# Patient Record
Sex: Female | Born: 1970 | Race: Black or African American | Hispanic: No | Marital: Married | Smoking: Never smoker
Health system: Southern US, Community
[De-identification: ages and names within clinical notes are randomized; demographics above are authoritative.]

## PROBLEM LIST (undated history)

## (undated) DIAGNOSIS — I10 Essential (primary) hypertension: Secondary | ICD-10-CM

## (undated) DIAGNOSIS — I639 Cerebral infarction, unspecified: Secondary | ICD-10-CM

## (undated) DIAGNOSIS — N189 Chronic kidney disease, unspecified: Secondary | ICD-10-CM

## (undated) DIAGNOSIS — E119 Type 2 diabetes mellitus without complications: Secondary | ICD-10-CM

## (undated) HISTORY — DX: Chronic kidney disease, unspecified: N18.9

## (undated) HISTORY — DX: Essential (primary) hypertension: I10

## (undated) HISTORY — DX: Type 2 diabetes mellitus without complications: E11.9

---

## 2000-07-17 ENCOUNTER — Other Ambulatory Visit: Admission: RE | Admit: 2000-07-17 | Discharge: 2000-07-17 | Payer: Self-pay | Admitting: Family Medicine

## 2013-10-16 ENCOUNTER — Other Ambulatory Visit: Payer: Self-pay

## 2013-10-16 DIAGNOSIS — Z1231 Encounter for screening mammogram for malignant neoplasm of breast: Secondary | ICD-10-CM

## 2013-11-11 ENCOUNTER — Ambulatory Visit: Payer: Self-pay

## 2013-11-25 ENCOUNTER — Ambulatory Visit
Admission: RE | Admit: 2013-11-25 | Discharge: 2013-11-25 | Disposition: A | Discharge status: Home / Self Care | Payer: Self-pay | Source: Ambulatory Visit

## 2013-11-25 DIAGNOSIS — Z1231 Encounter for screening mammogram for malignant neoplasm of breast: Secondary | ICD-10-CM

## 2015-06-09 ENCOUNTER — Encounter (INDEPENDENT_AMBULATORY_CARE_PROVIDER_SITE_OTHER): Payer: Self-pay

## 2015-06-09 ENCOUNTER — Other Ambulatory Visit (INDEPENDENT_AMBULATORY_CARE_PROVIDER_SITE_OTHER): Payer: Managed Care, Other (non HMO)

## 2015-06-09 ENCOUNTER — Ambulatory Visit (INDEPENDENT_AMBULATORY_CARE_PROVIDER_SITE_OTHER): Payer: Managed Care, Other (non HMO) | Admitting: Internal Medicine

## 2015-06-09 ENCOUNTER — Encounter: Payer: Self-pay | Admitting: Internal Medicine

## 2015-06-09 ENCOUNTER — Ambulatory Visit: Payer: Self-pay | Admitting: Internal Medicine

## 2015-06-09 VITALS — BP 178/102 | HR 97 | Temp 98.6°F | Resp 16 | Ht 66.0 in | Wt 160.4 lb

## 2015-06-09 DIAGNOSIS — I10 Essential (primary) hypertension: Secondary | ICD-10-CM

## 2015-06-09 LAB — COMPREHENSIVE METABOLIC PANEL
ALK PHOS: 72 U/L (ref 39–117)
ALT: 15 U/L (ref 0–35)
AST: 13 U/L (ref 0–37)
Albumin: 4.4 g/dL (ref 3.5–5.2)
BUN: 10 mg/dL (ref 6–23)
CO2: 28 meq/L (ref 19–32)
Calcium: 9.9 mg/dL (ref 8.4–10.5)
Chloride: 99 mEq/L (ref 96–112)
Creatinine, Ser: 0.63 mg/dL (ref 0.40–1.20)
GFR: 132.17 mL/min (ref >60.00)
Glucose, Bld: 172 mg/dL — ABNORMAL HIGH (ref 70–99)
POTASSIUM: 4.6 meq/L (ref 3.5–5.1)
Sodium: 135 mEq/L (ref 135–145)
Total Bilirubin: 0.5 mg/dL (ref 0.2–1.2)
Total Protein: 7.8 g/dL (ref 6.0–8.3)

## 2015-06-09 LAB — LIPID PANEL
CHOL/HDL RATIO: 3
Cholesterol: 172 mg/dL (ref 0–200)
HDL: 53.4 mg/dL (ref >39.00)
LDL Cholesterol: 99 mg/dL (ref 0–99)
NONHDL: 118.17
Triglycerides: 98 mg/dL (ref 0.0–149.0)
VLDL: 19.6 mg/dL (ref 0.0–40.0)

## 2015-06-09 LAB — CBC
HCT: 42.1 % (ref 36.0–46.0)
HEMOGLOBIN: 14.2 g/dL (ref 12.0–15.0)
MCHC: 33.6 g/dL (ref 30.0–36.0)
MCV: 83.5 fl (ref 78.0–100.0)
Platelets: 380 10*3/uL (ref 150.0–400.0)
RBC: 5.04 Mil/uL (ref 3.87–5.11)
RDW: 13.5 % (ref 11.5–15.5)
WBC: 7.3 10*3/uL (ref 4.0–10.5)

## 2015-06-09 LAB — HEMOGLOBIN A1C: HEMOGLOBIN A1C: 10.7 % — AB (ref 4.6–6.5)

## 2015-06-09 MED ORDER — AMLODIPINE BESYLATE 10 MG PO TABS: 10.0000 mg | ORAL_TABLET | Freq: Every day | ORAL | Status: DC

## 2015-06-09 NOTE — Assessment & Plan Note (Signed)
Given her moderately elevated BP with complications (she was not clear but likely some either retinopathy or hemorrhage at the back of the eye). Rx for amlodipine 10 mg daily and recheck in 2-3 weeks. Checking labs for any signs of complications.

## 2015-06-09 NOTE — Progress Notes (Signed)
Pre visit review using our clinic review tool, if applicable. No additional management support is needed unless otherwise documented below in the visit note. 

## 2015-06-09 NOTE — Progress Notes (Signed)
   Subjective:    Patient ID: Lori Fleming, female    DOB: Nov 01, 1970, 44 y.o.   MRN: [id number]  HPI The patient is a 44 YO female coming in for high blood pressure. She has been told this several times in the past. She has not seen a doctor in a long time and does not take medicine. Her eye doctor told her that she has changes in her eye of either blood pressure or sugars. She is having some mild headaches, no chest pains. No abdominal pain or nausea or confusion. Not exercising at all. No fevers or chills. No SOB.   PMH, [facility], social history reviewed and updated at visit.   Review of Systems  Constitutional: Negative for fever, activity change, appetite change, fatigue and unexpected weight change.  Eyes: Negative for redness and visual disturbance.       Bleeding at the back of the eye per eye doctor  Respiratory: Negative for cough, chest tightness, shortness of breath and wheezing.   Cardiovascular: Negative for chest pain, palpitations and leg swelling.  Gastrointestinal: Negative for nausea, abdominal pain, diarrhea, constipation and abdominal distention.  Musculoskeletal: Negative for myalgias, back pain, arthralgias and neck pain.  Skin: Negative.   Neurological: Positive for headaches. Negative for dizziness, syncope, weakness, light-headedness and numbness.  Psychiatric/Behavioral: Negative.       Objective:   Physical Exam  Constitutional: She is oriented to person, place, and time. She appears well-developed and well-nourished.  HENT:  Head: Normocephalic and atraumatic.  Eyes: EOM are normal.  Neck: Normal range of motion.  Cardiovascular: Normal rate and regular rhythm.   No murmur heard. Pulmonary/Chest: Effort normal and breath sounds normal. No respiratory distress. She has no wheezes. She has no rales.  Abdominal: Soft. Bowel sounds are normal. She exhibits no distension. There is no tenderness. There is no rebound.  Musculoskeletal: She exhibits no edema.    Neurological: She is alert and oriented to person, place, and time. Coordination normal.  Skin: Skin is warm and dry.  Psychiatric: She has a normal mood and affect.   Filed Vitals:   06/09/15 0919 06/09/15 0941  BP: 170/102 178/102  Pulse: 97   Temp: 98.6 F (37 C)   TempSrc: Oral   Resp: 16   Height: 5\' 6"  (1.676 m)   Weight: 160 lb 6.4 oz (72.757 kg)   SpO2: 99%       Assessment & Plan:

## 2015-06-09 NOTE — Patient Instructions (Signed)
We will start you on a medicine for the blood pressure called amlodipine. Take 1 pill daily for the pressure.  We would like to see you back in about 2-3 weeks to see how you are doing with the medicine and check on the pressure.  Work on exercising about 3-4 times per week for your health.

## 2015-06-13 ENCOUNTER — Other Ambulatory Visit: Payer: Self-pay | Admitting: Internal Medicine

## 2015-06-13 MED ORDER — METFORMIN HCL 500 MG PO TABS: 500.0000 mg | ORAL_TABLET | Freq: Two times a day (BID) | ORAL | Status: DC

## 2015-06-29 ENCOUNTER — Ambulatory Visit (INDEPENDENT_AMBULATORY_CARE_PROVIDER_SITE_OTHER): Payer: Managed Care, Other (non HMO)

## 2015-06-29 VITALS — BP 130/84

## 2015-06-29 DIAGNOSIS — I10 Essential (primary) hypertension: Secondary | ICD-10-CM | POA: Diagnosis not present

## 2015-06-29 NOTE — Progress Notes (Signed)
Have reviewed the encounter and was available for consult as needed.

## 2016-01-05 ENCOUNTER — Ambulatory Visit (INDEPENDENT_AMBULATORY_CARE_PROVIDER_SITE_OTHER): Payer: Managed Care, Other (non HMO) | Admitting: Internal Medicine

## 2016-01-05 ENCOUNTER — Encounter: Payer: Self-pay | Admitting: Internal Medicine

## 2016-01-05 ENCOUNTER — Other Ambulatory Visit (INDEPENDENT_AMBULATORY_CARE_PROVIDER_SITE_OTHER): Payer: Managed Care, Other (non HMO)

## 2016-01-05 VITALS — BP 122/80 | HR 91 | Temp 98.3°F | Resp 16 | Ht 66.0 in | Wt 163.0 lb

## 2016-01-05 DIAGNOSIS — I10 Essential (primary) hypertension: Secondary | ICD-10-CM

## 2016-01-05 DIAGNOSIS — E118 Type 2 diabetes mellitus with unspecified complications: Secondary | ICD-10-CM

## 2016-01-05 LAB — COMPREHENSIVE METABOLIC PANEL
ALBUMIN: 4 g/dL (ref 3.5–5.2)
ALT: 13 U/L (ref 0–35)
AST: 10 U/L (ref 0–37)
Alkaline Phosphatase: 75 U/L (ref 39–117)
BUN: 9 mg/dL (ref 6–23)
CALCIUM: 9.5 mg/dL (ref 8.4–10.5)
CHLORIDE: 97 meq/L (ref 96–112)
CO2: 29 meq/L (ref 19–32)
Creatinine, Ser: 0.7 mg/dL (ref 0.40–1.20)
GFR: 116.73 mL/min (ref >60.00)
Glucose, Bld: 331 mg/dL — ABNORMAL HIGH (ref 70–99)
Potassium: 4.2 mEq/L (ref 3.5–5.1)
Sodium: 132 mEq/L — ABNORMAL LOW (ref 135–145)
Total Bilirubin: 0.5 mg/dL (ref 0.2–1.2)
Total Protein: 7.1 g/dL (ref 6.0–8.3)

## 2016-01-05 LAB — MICROALBUMIN / CREATININE URINE RATIO
CREATININE, U: 221.8 mg/dL
MICROALB/CREAT RATIO: 5.2 mg/g (ref 0.0–30.0)
Microalb, Ur: 11.5 mg/dL — ABNORMAL HIGH (ref 0.0–1.9)

## 2016-01-05 LAB — HEMOGLOBIN A1C: Hgb A1c MFr Bld: 11.7 % — ABNORMAL HIGH (ref 4.6–6.5)

## 2016-01-05 MED ORDER — METFORMIN HCL ER 750 MG PO TB24: 750.0000 mg | ORAL_TABLET | Freq: Every day | ORAL | Status: DC

## 2016-01-05 NOTE — Assessment & Plan Note (Signed)
BP is at goal on amlodipine. Checking CMP and adjust as needed. No side effects from the medicine.

## 2016-01-05 NOTE — Assessment & Plan Note (Addendum)
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 Checking Hga1c, CMP, microalbumin. Adjust therapy as needed. Will change metformin to long acting for less side effects. New diagnosis for her and referred to diabetic education as she is interested in learning more about what she can do for her diabetes.

## 2016-01-05 NOTE — Progress Notes (Signed)
   Subjective:    Patient ID: Lori Fleming, female    DOB: 1971-09-21, 45 y.o.   MRN: [id number]  HPI The patient is a 45 YO female coming in for follow up of her blood pressure (started on amlodipine without problems, no side effects, no headaches or chest pains, complicated by retinopathy) as well as her new problem of diabetes (started taking metformin and makes her run to the bathroom during the day which is not ideal, she is having some numbness in her feet and the retinopathy moderate in her eyes, not on ACE-I or ARB, has family members with diabetes so has limited amount of knowledge about it). We discuss the etiology and prognosis with diabetes. She is interested in learning more and willing to do classes.   Review of Systems  Constitutional: Negative for fever, activity change, appetite change, fatigue and unexpected weight change.  HENT: Negative.   Eyes: Negative for redness and visual disturbance.       Bleeding at the back of the eye per eye doctor  Respiratory: Negative for cough, chest tightness, shortness of breath and wheezing.   Cardiovascular: Negative for chest pain, palpitations and leg swelling.  Gastrointestinal: Negative for nausea, abdominal pain, diarrhea, constipation and abdominal distention.  Musculoskeletal: Negative for myalgias, back pain, arthralgias and neck pain.  Skin: Negative.   Neurological: Negative for dizziness, syncope, weakness, light-headedness, numbness and headaches.  Psychiatric/Behavioral: Negative.       Objective:   Physical Exam  Constitutional: She is oriented to person, place, and time. She appears well-developed and well-nourished.  HENT:  Head: Normocephalic and atraumatic.  Eyes: EOM are normal.  Glasses  Neck: Normal range of motion.  Cardiovascular: Normal rate and regular rhythm.   No murmur heard. Pulmonary/Chest: Effort normal and breath sounds normal. No respiratory distress. She has no wheezes. She has no rales.    Abdominal: Soft. Bowel sounds are normal. She exhibits no distension. There is no tenderness. There is no rebound.  Musculoskeletal: She exhibits no edema.  Neurological: She is alert and oriented to person, place, and time. Coordination normal.  Skin: Skin is warm and dry.  See foot exam   Filed Vitals:   01/05/16 0931  BP: 122/80  Pulse: 91  Temp: 98.3 F (36.8 C)  TempSrc: Oral  Resp: 16  Height: 5\' 6"  (1.676 m)  Weight: 163 lb (73.936 kg)  SpO2: 97%      Assessment & Plan:  Visit time 25 minutes, greater than 50% of which was spent in counseling and coordination of care with the patient: counseling on new diagnosis of diabetes, etiology, treatment, complications, dietary changes, exercise.

## 2016-01-05 NOTE — Progress Notes (Signed)
Pre visit review using our clinic review tool, if applicable. No additional management support is needed unless otherwise documented below in the visit note. 

## 2016-01-05 NOTE — Patient Instructions (Signed)
We will check the labs today and call you back about the results.   We have also put in the order for a diabetes educator to talk to you about diabetes.   Diabetes and Foot Care Diabetes may cause you to have problems because of poor blood supply (circulation) to your feet and legs. This may cause the skin on your feet to become thinner, break easier, and heal more slowly. Your skin may become dry, and the skin may peel and crack. You may also have nerve damage in your legs and feet causing decreased feeling in them. You may not notice minor injuries to your feet that could lead to infections or more serious problems. Taking care of your feet is one of the most important things you can do for yourself.  HOME CARE INSTRUCTIONS  Wear shoes at all times, even in the house. Do not go barefoot. Bare feet are easily injured.  Check your feet daily for blisters, cuts, and redness. If you cannot see the bottom of your feet, use a mirror or ask someone for help.  Wash your feet with warm water (do not use hot water) and mild soap. Then pat your feet and the areas between your toes until they are completely dry. Do not soak your feet as this can dry your skin.  Apply a moisturizing lotion or petroleum jelly (that does not contain alcohol and is unscented) to the skin on your feet and to dry, brittle toenails. Do not apply lotion between your toes.  Trim your toenails straight across. Do not dig under them or around the cuticle. File the edges of your nails with an emery board or nail file.  Do not cut corns or calluses or try to remove them with medicine.  Wear clean socks or stockings every day. Make sure they are not too tight. Do not wear knee-high stockings since they may decrease blood flow to your legs.  Wear shoes that fit properly and have enough cushioning. To break in new shoes, wear them for just a few hours a day. This prevents you from injuring your feet. Always look in your shoes before you  put them on to be sure there are no objects inside.  Do not cross your legs. This may decrease the blood flow to your feet.  If you find a minor scrape, cut, or break in the skin on your feet, keep it and the skin around it clean and dry. These areas may be cleansed with mild soap and water. Do not cleanse the area with peroxide, alcohol, or iodine.  When you remove an adhesive bandage, be sure not to damage the skin around it.  If you have a wound, look at it several times a day to make sure it is healing.  Do not use heating pads or hot water bottles. They may burn your skin. If you have lost feeling in your feet or legs, you may not know it is happening until it is too late.  Make sure your health care provider performs a complete foot exam at least annually or more often if you have foot problems. Report any cuts, sores, or bruises to your health care provider immediately. SEEK MEDICAL CARE IF:   You have an injury that is not healing.  You have cuts or breaks in the skin.  You have an ingrown nail.  You notice redness on your legs or feet.  You feel burning or tingling in your legs or feet.  You have pain or cramps in your legs and feet.  Your legs or feet are numb.  Your feet always feel cold. SEEK IMMEDIATE MEDICAL CARE IF:   There is increasing redness, swelling, or pain in or around a wound.  There is a red line that goes up your leg.  Pus is coming from a wound.  You develop a fever or as directed by your health care provider.  You notice a bad smell coming from an ulcer or wound.   This information is not intended to replace advice given to you by your health care provider. Make sure you discuss any questions you have with your health care provider.   Document Released: 09/07/2000 Document Revised: 05/13/2013 Document Reviewed: 02/17/2013 Elsevier Interactive Patient Education 2016 [place].   Diabetes and Standards of Medical Care Diabetes is  complicated. You may find that your diabetes team includes a dietitian, nurse, diabetes educator, eye doctor, and more. To help everyone know what is going on and to help you get the care you deserve, the following schedule of care was developed to help keep you on track. Below are the tests, exams, vaccines, medicines, education, and plans you will need. HbA1c test This test shows how well you have controlled your glucose over the past 2-3 months. It is used to see if your diabetes management plan needs to be adjusted.   It is performed at least 2 times a year if you are meeting treatment goals.  It is performed 4 times a year if therapy has changed or if you are not meeting treatment goals. Blood pressure test  This test is performed at every routine medical visit. The goal is less than 140/90 mm Hg for most people, but 130/80 mm Hg in some cases. Ask your health care provider about your goal. Dental exam  Follow up with the dentist regularly. Eye exam  If you are diagnosed with type 1 diabetes as a child, get an exam upon reaching the age of 2 years or older and having had diabetes for 3-5 years. Yearly eye exams are recommended after that initial eye exam.  If you are diagnosed with type 1 diabetes as an adult, get an exam within 5 years of diagnosis and then yearly.  If you are diagnosed with type 2 diabetes, get an exam as soon as possible after the diagnosis and then yearly. Foot care exam  Visual foot exams are performed at every routine medical visit. The exams check for cuts, injuries, or other problems with the feet.  You should have a complete foot exam performed every year. This exam includes an inspection of the structure and skin of your feet, a check of the pulses in your feet, and a check of the sensation in your feet.  Type 1 diabetes: The first exam is performed 5 years after diagnosis.  Type 2 diabetes: The first exam is performed at the time of diagnosis.  Check  your feet nightly for cuts, injuries, or other problems with your feet. Tell your health care provider if anything is not healing. Kidney function test (urine microalbumin)  This test is performed once a year.  Type 1 diabetes: The first test is performed 5 years after diagnosis.  Type 2 diabetes: The first test is performed at the time of diagnosis.  A serum creatinine and estimated glomerular filtration rate (eGFR) test is done once a year to assess the level of chronic kidney disease (CKD), if present. Lipid profile (cholesterol, HDL,  LDL, triglycerides)  Performed every 5 years for most people.  The goal for LDL is less than 100 mg/dL. If you are at high risk, the goal is less than 70 mg/dL.  The goal for HDL is 40 mg/dL-50 mg/dL for men and 50 mg/dL-60 mg/dL for women. An HDL cholesterol of 60 mg/dL or higher gives some protection against heart disease.  The goal for triglycerides is less than 150 mg/dL. Immunizations  The flu (influenza) vaccine is recommended yearly for every person 12 months of age or older who has diabetes.  The pneumonia (pneumococcal) vaccine is recommended for every person 36 years of age or older who has diabetes. Adults 39 years of age or older may receive the pneumonia vaccine as a series of two separate shots.  The hepatitis B vaccine is recommended for adults shortly after they have been diagnosed with diabetes.  The Tdap (tetanus, diphtheria, and pertussis) vaccine should be given:  According to normal childhood vaccination schedules, for children.  Every 10 years, for adults who have diabetes. Diabetes self-management education  Education is recommended at diagnosis and ongoing as needed. Treatment plan  Your treatment plan is reviewed at every medical visit.   This information is not intended to replace advice given to you by your health care provider. Make sure you discuss any questions you have with your health care provider.   Document  Released: 07/08/2009 Document Revised: 10/01/2014 Document Reviewed: 02/10/2013 Elsevier Interactive Patient Education [employer].

## 2016-01-12 ENCOUNTER — Encounter: Payer: Self-pay | Admitting: Internal Medicine

## 2016-01-19 ENCOUNTER — Encounter: Payer: Managed Care, Other (non HMO) | Attending: Internal Medicine

## 2016-01-19 VITALS — Ht 65.0 in | Wt 165.0 lb

## 2016-01-19 DIAGNOSIS — E118 Type 2 diabetes mellitus with unspecified complications: Secondary | ICD-10-CM | POA: Diagnosis not present

## 2016-01-19 DIAGNOSIS — E119 Type 2 diabetes mellitus without complications: Secondary | ICD-10-CM

## 2016-01-20 NOTE — Progress Notes (Signed)
Patient was seen on 01/20/2016 for the first of a series of three diabetes self-management courses at the [facility].  Patient Education Plan per assessed needs and concerns is to attend four course education program for Diabetes Self Management Education.  The following learning objectives were met by the patient during this class:  Describe diabetes  State some common risk factors for diabetes  Defines the role of glucose and insulin  Identifies type of diabetes and pathophysiology  Describe the relationship between diabetes and cardiovascular risk  State the members of the Healthcare Team  States the rationale for glucose monitoring  State when to test glucose  State their individual Target Range  State the importance of logging glucose readings  Describe how to interpret glucose readings  Identifies A1C target  Explain the correlation between A1c and eAG values  State symptoms and treatment of high blood glucose  State symptoms and treatment of low blood glucose  Explain proper technique for glucose testing  Identifies proper sharps disposal  Handouts given during class include:  Living Well with Diabetes book  Carb Counting and Meal Planning book  Meal Plan Card  Carbohydrate guide  Meal planning worksheet  Low Sodium Flavoring Tips  The diabetes portion plate  F2B to eAG Conversion Chart  Diabetes Medications  Diabetes Recommended Care Schedule  Support Group  Diabetes Success Plan  Core Class Satisfaction Survey  Follow-Up Plan:  Attend core 2

## 2016-01-23 ENCOUNTER — Other Ambulatory Visit: Payer: Self-pay | Admitting: Internal Medicine

## 2016-01-23 DIAGNOSIS — IMO0001 Reserved for inherently not codable concepts without codable children: Secondary | ICD-10-CM

## 2016-01-23 DIAGNOSIS — E1165 Type 2 diabetes mellitus with hyperglycemia: Principal | ICD-10-CM

## 2016-01-23 MED ORDER — CANAGLIFLOZIN 300 MG PO TABS: 300.0000 mg | ORAL_TABLET | Freq: Every day | ORAL | Status: DC

## 2016-01-26 ENCOUNTER — Encounter: Payer: Managed Care, Other (non HMO) | Attending: Internal Medicine | Admitting: Skilled Nursing Facility1

## 2016-01-26 ENCOUNTER — Encounter: Payer: Self-pay | Admitting: Skilled Nursing Facility1

## 2016-01-26 DIAGNOSIS — E118 Type 2 diabetes mellitus with unspecified complications: Secondary | ICD-10-CM | POA: Insufficient documentation

## 2016-01-26 DIAGNOSIS — E119 Type 2 diabetes mellitus without complications: Secondary | ICD-10-CM

## 2016-01-26 NOTE — Progress Notes (Signed)

## 2016-01-27 ENCOUNTER — Other Ambulatory Visit: Payer: Self-pay | Admitting: Geriatric Medicine

## 2016-01-27 MED ORDER — CANAGLIFLOZIN 300 MG PO TABS: 300.0000 mg | ORAL_TABLET | Freq: Every day | ORAL | Status: DC

## 2016-02-02 ENCOUNTER — Encounter: Payer: Managed Care, Other (non HMO) | Admitting: Dietician

## 2016-02-02 DIAGNOSIS — E119 Type 2 diabetes mellitus without complications: Secondary | ICD-10-CM

## 2016-02-02 DIAGNOSIS — E118 Type 2 diabetes mellitus with unspecified complications: Secondary | ICD-10-CM | POA: Diagnosis not present

## 2016-02-03 ENCOUNTER — Encounter: Payer: Self-pay | Admitting: Dietician

## 2016-02-03 NOTE — Progress Notes (Deleted)
Patient was seen on *** for the third of a series of three diabetes self-management courses at the [facility].   Catalina Gravel the amount of activity recommended for healthy living . Describe activities suitable for individual needs . Identify ways to regularly incorporate activity into daily life . Identify barriers to activity and ways to over come these barriers  Identify diabetes medications being personally used and their primary action for lowering glucose and possible side effects . Describe role of stress on blood glucose and develop strategies to address psychosocial issues . Identify diabetes complications and ways to prevent them  Explain how to manage diabetes during illness . Evaluate success in meeting personal goal . Establish 2-3 goals that they will plan to diligently work on until they return for the  40-month follow-up visit  Goals:   I will count my carb choices at most meals and snacks  I will be active *** minutes or more *** times a week  I will take my diabetes medications as scheduled  I will eat less unhealthy fats by eating less ***  I will test my glucose at least *** times a day, *** days a week  I will look at patterns in my record book at least *** days a month  To help manage stress I will  *** at least *** times a week  Your patient has identified these potential barriers to change:  Motivation Finances Stress Lack of Family Support  Your patient has identified their diabetes self-care support plan as  [facility] Support Group Family Forensic scientist Resources Plan:  Attend Support Group as desired

## 2016-02-03 NOTE — Progress Notes (Signed)
Patient was seen on 02/02/16 for the third of a series of three diabetes self-management courses at the [facility].   Lori Fleming the amount of activity recommended for healthy living . Describe activities suitable for individual needs . Identify ways to regularly incorporate activity into daily life . Identify barriers to activity and ways to over come these barriers  Identify diabetes medications being personally used and their primary action for lowering glucose and possible side effects . Describe role of stress on blood glucose and develop strategies to address psychosocial issues . Identify diabetes complications and ways to prevent them  Explain how to manage diabetes during illness . Evaluate success in meeting personal goal . Establish 2-3 goals that they will plan to diligently work on until they return for the  75-month follow-up visit  Goals:   I will be active 45 minutes or more 5 times a week  I will take my diabetes medications as scheduled  Your patient has identified these potential barriers to change:  Stress  Your patient has identified their diabetes self-care support plan as  On-line Resources Plan:  Attend Support Group as desired

## 2016-02-08 LAB — HM DIABETES EYE EXAM

## 2016-02-24 ENCOUNTER — Ambulatory Visit: Payer: Self-pay | Admitting: Internal Medicine

## 2016-02-28 ENCOUNTER — Encounter: Payer: Self-pay | Admitting: Internal Medicine

## 2016-03-07 ENCOUNTER — Encounter: Payer: Self-pay | Admitting: Podiatry

## 2016-03-07 ENCOUNTER — Ambulatory Visit (INDEPENDENT_AMBULATORY_CARE_PROVIDER_SITE_OTHER): Payer: Managed Care, Other (non HMO) | Admitting: Podiatry

## 2016-03-07 VITALS — BP 172/100 | HR 96 | Resp 12

## 2016-03-07 DIAGNOSIS — R609 Edema, unspecified: Secondary | ICD-10-CM | POA: Diagnosis not present

## 2016-03-07 DIAGNOSIS — E119 Type 2 diabetes mellitus without complications: Secondary | ICD-10-CM

## 2016-03-07 NOTE — Patient Instructions (Signed)
Today her diabetic foot screen demonstrated adequate circulation in your feet as well as adequate feeling. There is mild swelling in your feet and ankles. Your feeling of tightness and occasional sharp pain may be related to swelling and possibly elevated A1c levels Continue to monitor these symptoms and return if they increase Return as needed or yearly  Diabetes and Foot Care Diabetes may cause you to have problems because of poor blood supply (circulation) to your feet and legs. This may cause the skin on your feet to become thinner, break easier, and heal more slowly. Your skin may become dry, and the skin may peel and crack. You may also have nerve damage in your legs and feet causing decreased feeling in them. You may not notice minor injuries to your feet that could lead to infections or more serious problems. Taking care of your feet is one of the most important things you can do for yourself.  HOME CARE INSTRUCTIONS  Wear shoes at all times, even in the house. Do not go barefoot. Bare feet are easily injured.  Check your feet daily for blisters, cuts, and redness. If you cannot see the bottom of your feet, use a mirror or ask someone for help.  Wash your feet with warm water (do not use hot water) and mild soap. Then pat your feet and the areas between your toes until they are completely dry. Do not soak your feet as this can dry your skin.  Apply a moisturizing lotion or petroleum jelly (that does not contain alcohol and is unscented) to the skin on your feet and to dry, brittle toenails. Do not apply lotion between your toes.  Trim your toenails straight across. Do not dig under them or around the cuticle. File the edges of your nails with an emery board or nail file.  Do not cut corns or calluses or try to remove them with medicine.  Wear clean socks or stockings every day. Make sure they are not too tight. Do not wear knee-high stockings since they may decrease blood flow to your  legs.  Wear shoes that fit properly and have enough cushioning. To break in new shoes, wear them for just a few hours a day. This prevents you from injuring your feet. Always look in your shoes before you put them on to be sure there are no objects inside.  Do not cross your legs. This may decrease the blood flow to your feet.  If you find a minor scrape, cut, or break in the skin on your feet, keep it and the skin around it clean and dry. These areas may be cleansed with mild soap and water. Do not cleanse the area with peroxide, alcohol, or iodine.  When you remove an adhesive bandage, be sure not to damage the skin around it.  If you have a wound, look at it several times a day to make sure it is healing.  Do not use heating pads or hot water bottles. They may burn your skin. If you have lost feeling in your feet or legs, you may not know it is happening until it is too late.  Make sure your health care provider performs a complete foot exam at least annually or more often if you have foot problems. Report any cuts, sores, or bruises to your health care provider immediately. SEEK MEDICAL CARE IF:   You have an injury that is not healing.  You have cuts or breaks in the skin.  You  have an ingrown nail.  You notice redness on your legs or feet.  You feel burning or tingling in your legs or feet.  You have pain or cramps in your legs and feet.  Your legs or feet are numb.  Your feet always feel cold. SEEK IMMEDIATE MEDICAL CARE IF:   There is increasing redness, swelling, or pain in or around a wound.  There is a red line that goes up your leg.  Pus is coming from a wound.  You develop a fever or as directed by your health care provider.  You notice a bad smell coming from an ulcer or wound.   This information is not intended to replace advice given to you by your health care provider. Make sure you discuss any questions you have with your health care provider.    Document Released: 09/07/2000 Document Revised: 05/13/2013 Document Reviewed: 02/17/2013 Elsevier Interactive Patient Education [employer].

## 2016-03-07 NOTE — Progress Notes (Signed)
   Subjective:    Patient ID: Lori Fleming, female    DOB: 1971/01/06, 45 y.o.   MRN: [id number]  HPI   As patient presents today complaining of swelling and a sharp tightness that she feels in her feet on and off weightbearing for approximately 2 months. She describes a sharp sensation occurring on and off weightbearing approximately every 3-4 days with approximately 2 episodes per day that last for several seconds and then subside without any specific treatment. She also describes a persistent low-grade swelling that increases with standing and walking and reduces with rest and elevation. With compression hose the swelling does reduce  Patient is a diabetic diagnosed in 2016. She denies any history of amputation, claudication or foot ulceration A1c levels the chart dated 01/05/2016 was 11.7  Review of Systems  Eyes: Positive for pain and visual disturbance.  Cardiovascular: Positive for leg swelling.  Musculoskeletal: Positive for gait problem.       Objective:   Physical Exam  BP 172/100 Pulse 96 Respiration 12  Orientated 3  Vascular: No calf pain bilaterally Mild peripheral edema foot ankles bilaterally DP and PT pulses 2/4 bilaterally Capillary reflex immediate bilaterally  Neurological: This patient to 10 g monofilament wire intact 5/5 bilaterally Vibratory sensation reactive bilaterally Ankle reflex equal and reactive bilaterally  Dermatological: No open skin lesions bilaterally Texture and turgor within normal limits bilaterally  Musculoskeletal: No deformities noted bilaterally There is no restriction ankle, subtalar, midtarsal joints bilaterally       Assessment & Plan:   Assessment: Mild peripheral edema that may be associated with hypertension Diabetic without foot complications History of elevated A1c  Plan: Today I reviewed the results of examination with patient today. I suggested that she return and have evaluation by her treating doctor  for diabetes to reduce her A1c's which are significantly elevated. Also, I made patient aware that the peripheral edema may need some additional medical management. As the occasional sharp pains are rather occasional and intermittent I recommended to the patient that the symptoms may reduce if her hemoglobin A1c is in a more normal range  Reappoint at patient's request or yearly

## 2016-03-22 ENCOUNTER — Encounter: Payer: Self-pay | Admitting: Internal Medicine

## 2016-03-22 ENCOUNTER — Ambulatory Visit (INDEPENDENT_AMBULATORY_CARE_PROVIDER_SITE_OTHER): Payer: Managed Care, Other (non HMO) | Admitting: Internal Medicine

## 2016-03-22 VITALS — BP 128/78 | HR 94 | Ht 67.0 in | Wt 163.0 lb

## 2016-03-22 DIAGNOSIS — E113399 Type 2 diabetes mellitus with moderate nonproliferative diabetic retinopathy without macular edema, unspecified eye: Secondary | ICD-10-CM | POA: Diagnosis not present

## 2016-03-22 DIAGNOSIS — E1165 Type 2 diabetes mellitus with hyperglycemia: Secondary | ICD-10-CM

## 2016-03-22 DIAGNOSIS — E118 Type 2 diabetes mellitus with unspecified complications: Secondary | ICD-10-CM | POA: Insufficient documentation

## 2016-03-22 MED ORDER — METFORMIN HCL ER 750 MG PO TB24: 750.0000 mg | ORAL_TABLET | Freq: Every day | ORAL | Status: DC

## 2016-03-22 MED ORDER — LINAGLIPTIN 5 MG PO TABS: 5.0000 mg | ORAL_TABLET | Freq: Every day | ORAL | Status: DC

## 2016-03-22 MED ORDER — GLIPIZIDE 5 MG PO TABS
5.0000 mg | ORAL_TABLET | Freq: Two times a day (BID) | ORAL | Status: DC
Start: 2016-03-22 — End: 2017-07-08

## 2016-03-22 NOTE — Progress Notes (Signed)
Patient ID: Lori Fleming, female   DOB: 1970/11/30, 45 y.o.   MRN: [id number]  HPI: Lori Fleming is a 45 y.o.-year-old female, referred by her PCP, Dr. 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  She went to see her ophthalmologist last fall >> abnormal eye exam >> advised to be checked for HTN and DM. Her screening has come back positive for DM and also HTN.  She does have fatigue, blurry vision, increased thirst and urination. No weight loss.  She works 2 jobs.  Last hemoglobin A1c was: Lab Results  Component Value Date   HGBA1C 11.7* 01/05/2016   HGBA1C 10.7* 06/09/2015   Pt is on a regimen of: - Metformin ER 750 mg 1x a day, with b'fast. She tried regular Metformin >> frequent stools.  Pt started to check sugars in last 2 weeks >> 1x a day and they are: - am: 149, 164-226, 261 - 2h after b'fast: n/c - before lunch: n/c - 2h after lunch: n/c - before dinner: n/c - 2h after dinner: n/c - bedtime: n/c - nighttime: n/c No lows. Lowest sugar was 149 she has hypoglycemia awareness at 70.  Highest sugar was 261.  Glucometer: [place]  Pt's meals are: - Breakfast: instant oatmeal, cereal - Lunch: can food (soup), frozen meals, fast food - Dinner: eating out or fast food - Snacks: chips, applesauce, fruit (banan apple/apple) Used to drink sweet drinks >> stopped.   She saw Antonieta Iba with nutrition.  - no CKD, last BUN/creatinine:  Lab Results  Component Value Date   BUN 9 01/05/2016   BUN 10 06/09/2015   CREATININE 0.70 01/05/2016   CREATININE 0.63 06/09/2015   - last set of lipids: Lab Results  Component Value Date   CHOL 172 06/09/2015   HDL 53.40 06/09/2015   LDLCALC 99 06/09/2015   TRIG 98.0 06/09/2015   CHOLHDL 3 06/09/2015   - last eye exam was in 02/08/2016. 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  - + numbness and tingling in her feet. Saw podiatrist in 02/2016: Dr. Little Ishikawa  Pt has FH of DM in  Father, M aunt.  ROS: Constitutional: no weight gain/loss, + fatigue, no subjective hyperthermia/hypothermia, + increased urination Constitutional: + weight gain, + fatigue, + hot flushes, + poor sleep, + nocturia Eyes: + blurry vision, no xerophthalmia ENT: no sore throat, no nodules palpated in throat, no dysphagia/odynophagia, no hoarseness Cardiovascular: no CP/SOB/no palpitations/+ leg swelling Respiratory: no cough/SOB Gastrointestinal: + N/+ V/+ D/no C, + heartburn Musculoskeletal: + muscle aches/no joint aches Skin: + rash - L lower leg, + itching, + hair loss Neurological: no tremors/numbness/tingling/dizziness, + HA Psychiatric: no depression/+ anxiety  Past Medical History  Diagnosis Date  . Hypertension   . Diabetes mellitus without complication ([place])    No past surgical history on file. Social History   Social History  . Marital Status: Married    Spouse Name: N/A  . Number of Children: no   Occupational History  . Collector, Clinical research associate   Social History Main Topics  . Smoking status: Never Smoker   . Smokeless tobacco: Not on file  . Alcohol Use: No  . Drug Use: No   Current Outpatient Prescriptions on File Prior to Visit  Medication Sig Dispense Refill  . amLODipine (NORVASC) 10 MG tablet Take 1 tablet (10 mg total) by mouth daily. 90 tablet 3   No current facility-administered medications on file prior to visit.   No Known Allergies Family  History  Problem Relation Age of Onset  . Hypertension Father   . Diabetes Father   . Hypertension Sister   . Mental illness Sister   . Mental illness Brother   . Diabetes Maternal Aunt   . Alcohol abuse Paternal Grandfather    PE: BP 128/78 mmHg  Pulse 94  Ht 5\' 7"  (1.702 m)  Wt 163 lb (73.936 kg)  BMI 25.52 kg/m2  SpO2 98% Wt Readings from Last 3 Encounters:  03/22/16 163 lb (73.936 kg)  01/20/16 165 lb (74.844 kg)  01/05/16 163 lb (73.936 kg)   Constitutional: overweight, in NAD Eyes: PERRLA, EOMI,  no exophthalmos ENT: moist mucous membranes, no thyromegaly, no cervical lymphadenopathy Cardiovascular: RRR, No MRG Respiratory: CTA B Gastrointestinal: abdomen soft, NT, ND, BS+ Musculoskeletal: no deformities, strength intact in all 4 Skin: moist, warm, no rashes Neurological: no tremor with outstretched hands, DTR normal in all 4  ASSESSMENT: 1. 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 Patient with recent dx of diabetes, on oral antidiabetic regimen, which became insufficient. She is tolerating the Metformin OK, but has instances when she has frequent stools.  - Her sugars are very high in am and I suspect they are increasing as the day goes by. I suggested insulin as she appear glucotoxic >> refuses. I showed her a pen and demonstrated use >> still refuses. In that case >> suggested to mocve Metformin at night to reduce nighttime liver gluconeogenesis and also add Glipizide and Tradjenta. Advised her to let me know if sugars stay high. At next visit, we need to check her for DM1.  - strongly advised her to avoid fast food and sweet drinks  - discussed high vs low glycemic index foods - I suggested to:  Patient Instructions  Please move the Metformin ER 750 mg with dinner.  Start Glipizide 5 mg 2x a day, 15 min before b'fast and before dinner.  Start Tradjenta 5 mg before b'fast.  Please return in 1.5 months with your sugar log.   Please let me know if the sugars are consistently <80 or >200.  - Strongly advised her to start checking sugars at different times of the day - check (1-)2 times a day, rotating checks - given sugar log and advised how to fill it and to bring it at next appt  - given foot care handout and explained the principles  - given instructions for hypoglycemia management "15-15 rule"  - advised for yearly eye exams >> she is UTD - recommended nutrition classes in the DM center upstairs and the support  group: [facility]. - Return to clinic in 1.5 mo with sugar log   Philemon Kingdom, MD PhD [facility] Endocrinology

## 2016-03-22 NOTE — Patient Instructions (Addendum)
Please move the Metformin ER 750 mg with dinner.  Start Glipizide 5 mg 2x a day, 15 min before b'fast and before dinner.  Start Tradjenta 5 mg before b'fast.  Please return in 1.5 months with your sugar log.   Please let me know if the sugars are consistently <80 or >200.  PATIENT INSTRUCTIONS FOR TYPE 2 DIABETES:  **Please join MyChart!** - see attached instructions about how to join if you have not done so already.  DIET AND EXERCISE Diet and exercise is an important part of diabetic treatment.  We recommended aerobic exercise in the form of brisk walking (working between 40-60% of maximal aerobic capacity, similar to brisk walking) for 150 minutes per week (such as 30 minutes five days per week) along with 3 times per week performing 'resistance' training (using various gauge rubber tubes with handles) 5-10 exercises involving the major muscle groups (upper body, lower body and core) performing 10-15 repetitions (or near fatigue) each exercise. Start at half the above goal but build slowly to reach the above goals. If limited by weight, joint pain, or disability, we recommend daily walking in a swimming pool with water up to waist to reduce pressure from joints while allow for adequate exercise.    BLOOD GLUCOSES Monitoring your blood glucoses is important for continued management of your diabetes. Please check your blood glucoses 2-4 times a day: fasting, before meals and at bedtime (you can rotate these measurements - e.g. one day check before the 3 meals, the next day check before 2 of the meals and before bedtime, etc.).   HYPOGLYCEMIA (low blood sugar) Hypoglycemia is usually a reaction to not eating, exercising, or taking too much insulin/ other diabetes drugs.  Symptoms include tremors, sweating, hunger, confusion, headache, etc. Treat IMMEDIATELY with 15 grams of Carbs: . 4 glucose tablets .  cup regular juice/soda . 2 tablespoons raisins . 4 teaspoons sugar . 1 tablespoon  honey Recheck blood glucose in 15 mins and repeat above if still symptomatic/blood glucose <100.  RECOMMENDATIONS TO REDUCE YOUR RISK OF DIABETIC COMPLICATIONS: * Take your prescribed MEDICATION(S) * Follow a DIABETIC diet: Complex carbs, fiber rich foods, (monounsaturated and polyunsaturated) fats * AVOID saturated/trans fats, high fat foods, >2,300 mg salt per day. * EXERCISE at least 5 times a week for 30 minutes or preferably daily.  * DO NOT SMOKE OR DRINK more than 1 drink a day. * Check your FEET every day. Do not wear tightfitting shoes. Contact us if you develop an ulcer * See your EYE doctor once a year or more if needed * Get a FLU shot once a year * Get a PNEUMONIA vaccine once before and once after age 70 years  GOALS:  * Your Hemoglobin A1c of <7%  * fasting sugars need to be <130 * after meals sugars need to be <180 (2h after you start eating) * Your Systolic BP should be [id number] or lower  * Your Diastolic BP should be 80 or lower  * Your HDL (Good Cholesterol) should be 40 or higher  * Your LDL (Bad Cholesterol) should be 100 or lower. * Your Triglycerides should be 150 or lower  * Your Urine microalbumin (kidney function) should be <30 * Your Body Mass Index should be 25 or lower    Please consider the following ways to cut down carbs and fat and increase fiber and micronutrients in your diet: - substitute whole grain for white bread or pasta - substitute brown rice for white  rice - substitute 90-calorie flat bread pieces for slices of bread when possible - substitute sweet potatoes or yams for white potatoes - substitute humus for margarine - substitute tofu for cheese when possible - substitute almond or rice milk for regular milk (would not drink soy milk daily due to concern for soy estrogen influence on breast cancer risk) - substitute dark chocolate for other sweets when possible - substitute water - can add lemon or orange slices for taste - for diet sodas  (artificial sweeteners will trick your body that you can eat sweets without getting calories and will lead you to overeating and weight gain in the long run) - do not skip breakfast or other meals (this will slow down the metabolism and will result in more weight gain over time)  - can try smoothies made from fruit and almond/rice milk in am instead of regular breakfast - can also try old-fashioned (not instant) oatmeal made with almond/rice milk in am - order the dressing on the side when eating salad at a restaurant (pour less than half of the dressing on the salad) - eat as little meat as possible - can try juicing, but should not forget that juicing will get rid of the fiber, so would alternate with eating raw veg./fruits or drinking smoothies - use as little oil as possible, even when using olive oil - can dress a salad with a mix of balsamic vinegar and lemon juice, for e.g. - use agave nectar, stevia sugar, or regular sugar rather than artificial sweateners - steam or broil/roast veggies  - snack on veggies/fruit/nuts (unsalted, preferably) when possible, rather than processed foods - reduce or eliminate aspartame in diet (it is in diet sodas, chewing gum, etc) Read the labels!  Try to read Dr. Janene Harvey book: "Program for Reversing Diabetes" for other ideas for healthy eating.

## 2016-04-03 ENCOUNTER — Other Ambulatory Visit: Payer: Self-pay | Admitting: Internal Medicine

## 2016-04-03 DIAGNOSIS — Z139 Encounter for screening, unspecified: Secondary | ICD-10-CM

## 2016-04-05 ENCOUNTER — Encounter: Payer: Self-pay | Admitting: Internal Medicine

## 2016-04-05 ENCOUNTER — Ambulatory Visit (INDEPENDENT_AMBULATORY_CARE_PROVIDER_SITE_OTHER): Payer: Managed Care, Other (non HMO) | Admitting: Internal Medicine

## 2016-04-05 VITALS — BP 134/82 | HR 102 | Temp 97.9°F | Resp 16 | Ht 66.0 in | Wt 162.0 lb

## 2016-04-05 DIAGNOSIS — N6001 Solitary cyst of right breast: Secondary | ICD-10-CM

## 2016-04-05 DIAGNOSIS — I10 Essential (primary) hypertension: Secondary | ICD-10-CM | POA: Diagnosis not present

## 2016-04-05 DIAGNOSIS — E113399 Type 2 diabetes mellitus with moderate nonproliferative diabetic retinopathy without macular edema, unspecified eye: Secondary | ICD-10-CM

## 2016-04-05 DIAGNOSIS — N6009 Solitary cyst of unspecified breast: Secondary | ICD-10-CM | POA: Insufficient documentation

## 2016-04-05 DIAGNOSIS — E1165 Type 2 diabetes mellitus with hyperglycemia: Secondary | ICD-10-CM

## 2016-04-05 NOTE — Assessment & Plan Note (Signed)
BP at goal on amlodipine 10 mg daily. She should be on ACE-I but declines change today. Given the retinopathy more important to keep her at goal.

## 2016-04-05 NOTE — Assessment & Plan Note (Signed)
Some of her sugar readings are still too high post-prandial. 80 point change between some meals. She is seeing endo and will forward this note to them so they are aware she is not taking tradjenta.

## 2016-04-05 NOTE — Patient Instructions (Addendum)
We have ordered the mammogram.   Keep the appointment with the diabetes doctor next month. Since they have changed the medicine recently we will not recheck labs today. They will likely check labs when you see them next month.   Diabetes and Standards of Medical Care Diabetes is complicated. You may find that your diabetes team includes a dietitian, nurse, diabetes educator, eye doctor, and more. To help everyone know what is going on and to help you get the care you deserve, the following schedule of care was developed to help keep you on track. Below are the tests, exams, vaccines, medicines, education, and plans you will need. HbA1c test This test shows how well you have controlled your glucose over the past 2-3 months. It is used to see if your diabetes management plan needs to be adjusted.   It is performed at least 2 times a year if you are meeting treatment goals.  It is performed 4 times a year if therapy has changed or if you are not meeting treatment goals. Blood pressure test  This test is performed at every routine medical visit. The goal is less than 140/90 mm Hg for most people, but 130/80 mm Hg in some cases. Ask your health care provider about your goal. Dental exam  Follow up with the dentist regularly. Eye exam  If you are diagnosed with type 1 diabetes as a child, get an exam upon reaching the age of 10 years or older and having had diabetes for 3-5 years. Yearly eye exams are recommended after that initial eye exam.  If you are diagnosed with type 1 diabetes as an adult, get an exam within 5 years of diagnosis and then yearly.  If you are diagnosed with type 2 diabetes, get an exam as soon as possible after the diagnosis and then yearly. Foot care exam  Visual foot exams are performed at every routine medical visit. The exams check for cuts, injuries, or other problems with the feet.  You should have a complete foot exam performed every year. This exam includes an  inspection of the structure and skin of your feet, a check of the pulses in your feet, and a check of the sensation in your feet.  Type 1 diabetes: The first exam is performed 5 years after diagnosis.  Type 2 diabetes: The first exam is performed at the time of diagnosis.  Check your feet nightly for cuts, injuries, or other problems with your feet. Tell your health care provider if anything is not healing. Kidney function test (urine microalbumin)  This test is performed once a year.  Type 1 diabetes: The first test is performed 5 years after diagnosis.  Type 2 diabetes: The first test is performed at the time of diagnosis.  A serum creatinine and estimated glomerular filtration rate (eGFR) test is done once a year to assess the level of chronic kidney disease (CKD), if present. Lipid profile (cholesterol, HDL, LDL, triglycerides)  Performed every 5 years for most people.  The goal for LDL is less than 100 mg/dL. If you are at high risk, the goal is less than 70 mg/dL.  The goal for HDL is 40 mg/dL-50 mg/dL for men and 50 LT/JW-62 mg/dL for women. An HDL cholesterol of 60 mg/dL or higher gives some protection against heart disease.  The goal for triglycerides is less than 150 mg/dL. Immunizations  The flu (influenza) vaccine is recommended yearly for every person 66 months of age or older who has diabetes.  The pneumonia (pneumococcal) vaccine is recommended for every person 2 years of age or older who has diabetes. Adults 65 years of age or older may receive the pneumonia vaccine as a series of two separate shots.  The hepatitis B vaccine is recommended for adults shortly after they have been diagnosed with diabetes.  The Tdap (tetanus, diphtheria, and pertussis) vaccine should be given:  According to normal childhood vaccination schedules, for children.  Every 10 years, for adults who have diabetes. Diabetes self-management education  Education is recommended at diagnosis  and ongoing as needed. Treatment plan  Your treatment plan is reviewed at every medical visit.   This information is not intended to replace advice given to you by your health care provider. Make sure you discuss any questions you have with your health care provider.   Document Released: 07/08/2009 Document Revised: 10/01/2014 Document Reviewed: 02/10/2013 Elsevier Interactive Patient Education 2016 [employer] Inc.  

## 2016-04-05 NOTE — Progress Notes (Signed)
Pre visit review using our clinic review tool, if applicable. No additional management support is needed unless otherwise documented below in the visit note. 

## 2016-04-05 NOTE — Progress Notes (Signed)
   Subjective:    Patient ID: Lori Fleming, female    DOB: 1971/01/12, 45 y.o.   MRN: [id number]  HPI The patient is a 45 YO female coming in for follow up of her blood pressure. She has also seen endocrinology and checking her sugars some. Checking sugars before meals and some are in the 100-150 before meals. Also some random after meals which are 150-190. A couple in the 200s with 1 missed med day and other 220 without missed meds. Overall improved in July from early June. She did not pick up tradjenta as it was too expensive so is not taking.  Also having a new problem of cyst near her right breast. Started about 2 months ago and since it came has not grown. Does not drain out anything. Not tender. No rash on the skin. She tried to schedule a mammogram and was told to come get it checked out here. Has not tried anything for it.   Review of Systems  Constitutional: Negative for fever, activity change, appetite change, fatigue and unexpected weight change.  Eyes: Negative for redness and visual disturbance.  Respiratory: Negative for cough, chest tightness, shortness of breath and wheezing.   Cardiovascular: Negative for chest pain, palpitations and leg swelling.  Gastrointestinal: Negative for nausea, abdominal pain, diarrhea, constipation and abdominal distention.  Musculoskeletal: Negative for myalgias, back pain, arthralgias and neck pain.  Skin:       cyst  Neurological: Positive for numbness. Negative for dizziness, syncope, weakness, light-headedness and headaches.      Objective:   Physical Exam  Constitutional: She is oriented to person, place, and time. She appears well-developed and well-nourished.  HENT:  Head: Normocephalic and atraumatic.  Eyes: EOM are normal.  Glasses  Neck: Normal range of motion.  Cardiovascular: Normal rate and regular rhythm.   No murmur heard. Pulmonary/Chest: Effort normal and breath sounds normal. No respiratory distress. She has no wheezes.  She has no rales.  Cyst adjacent to the right breast on the chest wall. No abscess appreciated. Mobile, non-tender  Abdominal: Soft. Bowel sounds are normal. She exhibits no distension. There is no tenderness. There is no rebound.  Musculoskeletal: She exhibits no edema.  Neurological: She is alert and oriented to person, place, and time. Coordination normal.  Skin: Skin is warm and dry.   Filed Vitals:   04/05/16 0950  BP: 134/82  Pulse: 102  Temp: 97.9 F (36.6 C)  TempSrc: Oral  Resp: 16  Height: 5\' 6"  (1.676 m)  Weight: 162 lb (73.483 kg)  SpO2: 98%      Assessment & Plan:

## 2016-04-05 NOTE — Assessment & Plan Note (Signed)
Adjacent to the right breast. Ordered diagnostic mammogram bilateral.

## 2016-04-09 ENCOUNTER — Other Ambulatory Visit: Payer: Self-pay | Admitting: Internal Medicine

## 2016-04-09 DIAGNOSIS — N6001 Solitary cyst of right breast: Secondary | ICD-10-CM

## 2016-04-11 ENCOUNTER — Ambulatory Visit
Admission: RE | Admit: 2016-04-11 | Discharge: 2016-04-11 | Disposition: A | Discharge status: Home / Self Care | Payer: Managed Care, Other (non HMO) | Source: Ambulatory Visit | Attending: Internal Medicine | Admitting: Internal Medicine

## 2016-04-11 DIAGNOSIS — N6001 Solitary cyst of right breast: Secondary | ICD-10-CM

## 2016-05-23 ENCOUNTER — Ambulatory Visit: Payer: Managed Care, Other (non HMO) | Admitting: Internal Medicine

## 2016-10-10 ENCOUNTER — Ambulatory Visit: Payer: Managed Care, Other (non HMO) | Admitting: Internal Medicine

## 2016-11-07 ENCOUNTER — Encounter: Payer: Self-pay | Admitting: Podiatry

## 2016-11-07 ENCOUNTER — Ambulatory Visit (INDEPENDENT_AMBULATORY_CARE_PROVIDER_SITE_OTHER): Payer: Managed Care, Other (non HMO) | Admitting: Podiatry

## 2016-11-07 DIAGNOSIS — L84 Corns and callosities: Secondary | ICD-10-CM | POA: Diagnosis not present

## 2016-11-07 DIAGNOSIS — M2041 Other hammer toe(s) (acquired), right foot: Secondary | ICD-10-CM

## 2016-11-07 NOTE — Progress Notes (Signed)
   Subjective:    Patient ID: Lori Fleming, female    DOB: May 23, 1971, 47 y.o.   MRN: [id number]  HPI    Patient presents today complaining of approximately week history of a painful corn on the fourth right toe. Patient describes discomfort when wearing shoes and walking in around the fourth right toe area. She denies any drainage, warmth, pus from the area. Patient describes ongoing numbness and tingling and burning in her feet on and off weightbearing which she had medicine on the visit of 03/07/2016.  Patient is diabetic without history amputation or claudication. She describes elevated A1c's  Review of Systems  All other systems reviewed and are negative.      Objective:   Physical Exam  Vascular: No calf pain bilaterally Mild peripheral edema foot ankles bilaterally DP and PT pulses 2/4 bilaterally Capillary reflex immediate bilaterally  Neurological: This patient to 10 g monofilament wire intact 5/5 bilaterally Vibratory sensation reactive bilaterally Ankle reflex equal and reactive bilaterally  Dermatological: No open skin lesions bilaterally Texture and turgor within normal limits bilaterally Corn on the dorsal PIPJ fourth right toe. 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 I trim the corn without any bleeding and dispensed a gelcap to wear in the fourth right toe. Return as needed or yearly

## 2016-11-07 NOTE — Patient Instructions (Signed)
Corns and Calluses Introduction Corns are small areas of thickened skin that occur on the top, sides, or tip of a toe. They contain a cone-shaped core with a point that can press on a nerve below. This causes pain. Calluses are areas of thickened skin that can occur anywhere on the body including hands, fingers, palms, soles of the feet, and heels.Calluses are usually larger than corns. What are the causes? Corns and calluses are caused by rubbing (friction) or pressure, such as from shoes that are too tight or do not fit properly. What increases the risk? Corns are more likely to develop in people who have toe deformities, such as hammer toes. Since calluses can occur with friction to any area of the skin, calluses are more likely to develop in people who:  Work with their hands.  Wear shoes that fit poorly, shoes that are too tight, or shoes that are high-heeled.  Have toes deformities. What are the signs or symptoms? Symptoms of a corn or callus include:  A hard growth on the skin.  Pain or tenderness under the skin.  Redness and swelling.  Increased discomfort while wearing tight-fitting shoes. How is this diagnosed? Corns and calluses may be diagnosed with a medical history and physical exam. How is this treated? Corns and calluses may be treated with:  Removing the cause of the friction or pressure. This may include:  Changing your shoes.  Wearing shoe inserts (orthotics) or other protective layers in your shoes, such as a corn pad.  Wearing gloves.  Medicines to help soften skin in the hardened, thickened areas.  Reducing the size of the corn or callus by removing the dead layers of skin.  Antibiotic medicines to treat infection.  Surgery, if a toe deformity is the cause. Follow these instructions at home:  Take medicines only as directed by your health care provider.  If you were prescribed an antibiotic, finish all of it even if you start to feel  better.  Wear shoes that fit well. Avoid wearing high-heeled shoes and shoes that are too tight or too loose.  Wear any padding, protective layers, gloves, or orthotics as directed by your health care provider.  Soak your hands or feet and then use a file or pumice stone to soften your corn or callus. Do this as directed by your health care provider.  Check your corn or callus every day for signs of infection. Watch for:  Redness, swelling, or pain.  Fluid, blood, or pus. Contact a health care provider if:  Your symptoms do not improve with treatment.  You have increased redness, swelling, or pain at the site of your corn or callus.  You have fluid, blood, or pus coming from your corn or callus.  You have new symptoms. This information is not intended to replace advice given to you by your health care provider. Make sure you discuss any questions you have with your health care provider. Document Released: 06/16/2004 Document Revised: 03/30/2016 Document Reviewed: 09/06/2014  2017 Elsevier Diabetes and Foot Care Diabetes may cause you to have problems because of poor blood supply (circulation) to your feet and legs. This may cause the skin on your feet to become thinner, break easier, and heal more slowly. Your skin may become dry, and the skin may peel and crack. You may also have nerve damage in your legs and feet causing decreased feeling in them. You may not notice minor injuries to your feet that could lead to infections or more  serious problems. Taking care of your feet is one of the most important things you can do for yourself. Follow these instructions at home:  Wear shoes at all times, even in the house. Do not go barefoot. Bare feet are easily injured.  Check your feet daily for blisters, cuts, and redness. If you cannot see the bottom of your feet, use a mirror or ask someone for help.  Wash your feet with warm water (do not use hot water) and mild soap. Then pat your  feet and the areas between your toes until they are completely dry. Do not soak your feet as this can dry your skin.  Apply a moisturizing lotion or petroleum jelly (that does not contain alcohol and is unscented) to the skin on your feet and to dry, brittle toenails. Do not apply lotion between your toes.  Trim your toenails straight across. Do not dig under them or around the cuticle. File the edges of your nails with an emery board or nail file.  Do not cut corns or calluses or try to remove them with medicine.  Wear clean socks or stockings every day. Make sure they are not too tight. Do not wear knee-high stockings since they may decrease blood flow to your legs.  Wear shoes that fit properly and have enough cushioning. To break in new shoes, wear them for just a few hours a day. This prevents you from injuring your feet. Always look in your shoes before you put them on to be sure there are no objects inside.  Do not cross your legs. This may decrease the blood flow to your feet.  If you find a minor scrape, cut, or break in the skin on your feet, keep it and the skin around it clean and dry. These areas may be cleansed with mild soap and water. Do not cleanse the area with peroxide, alcohol, or iodine.  When you remove an adhesive bandage, be sure not to damage the skin around it.  If you have a wound, look at it several times a day to make sure it is healing.  Do not use heating pads or hot water bottles. They may burn your skin. If you have lost feeling in your feet or legs, you may not know it is happening until it is too late.  Make sure your health care provider performs a complete foot exam at least annually or more often if you have foot problems. Report any cuts, sores, or bruises to your health care provider immediately. Contact a health care provider if:  You have an injury that is not healing.  You have cuts or breaks in the skin.  You have an ingrown nail.  You notice  redness on your legs or feet.  You feel burning or tingling in your legs or feet.  You have pain or cramps in your legs and feet.  Your legs or feet are numb.  Your feet always feel cold. Get help right away if:  There is increasing redness, swelling, or pain in or around a wound.  There is a red line that goes up your leg.  Pus is coming from a wound.  You develop a fever or as directed by your health care provider.  You notice a bad smell coming from an ulcer or wound. This information is not intended to replace advice given to you by your health care provider. Make sure you discuss any questions you have with your health care provider. Document  Released: 09/07/2000 Document Revised: 02/16/2016 Document Reviewed: 02/17/2013 Elsevier Interactive Patient Education  2017 [employer].

## 2017-03-06 ENCOUNTER — Ambulatory Visit: Payer: Managed Care, Other (non HMO) | Admitting: Podiatry

## 2017-04-17 ENCOUNTER — Ambulatory Visit: Payer: Managed Care, Other (non HMO) | Admitting: Internal Medicine

## 2017-05-17 ENCOUNTER — Ambulatory Visit: Payer: Managed Care, Other (non HMO) | Admitting: Internal Medicine

## 2017-07-08 ENCOUNTER — Encounter: Payer: Self-pay | Admitting: Internal Medicine

## 2017-07-08 ENCOUNTER — Ambulatory Visit (INDEPENDENT_AMBULATORY_CARE_PROVIDER_SITE_OTHER): Payer: Managed Care, Other (non HMO) | Admitting: Internal Medicine

## 2017-07-08 VITALS — BP 144/92 | HR 77 | Temp 98.2°F | Wt 161.5 lb

## 2017-07-08 DIAGNOSIS — E1165 Type 2 diabetes mellitus with hyperglycemia: Secondary | ICD-10-CM

## 2017-07-08 DIAGNOSIS — E113399 Type 2 diabetes mellitus with moderate nonproliferative diabetic retinopathy without macular edema, unspecified eye: Secondary | ICD-10-CM | POA: Diagnosis not present

## 2017-07-08 DIAGNOSIS — IMO0002 Reserved for concepts with insufficient information to code with codable children: Secondary | ICD-10-CM

## 2017-07-08 LAB — COMPLETE METABOLIC PANEL WITH GFR
AG Ratio: 1.4 (calc) (ref 1.0–2.5)
ALBUMIN MSPROF: 3.9 g/dL (ref 3.6–5.1)
ALKALINE PHOSPHATASE (APISO): 61 U/L (ref 33–115)
ALT: 11 U/L (ref 6–29)
AST: 10 U/L (ref 10–35)
BILIRUBIN TOTAL: 0.3 mg/dL (ref 0.2–1.2)
BUN: 14 mg/dL (ref 7–25)
CHLORIDE: 104 mmol/L (ref 98–110)
CO2: 26 mmol/L (ref 20–32)
Calcium: 9.4 mg/dL (ref 8.6–10.2)
Creat: 0.63 mg/dL (ref 0.50–1.10)
GFR, Est African American: 126 mL/min/{1.73_m2} (ref >=60)
GFR, Est Non African American: 108 mL/min/{1.73_m2} (ref >=60)
GLUCOSE: 195 mg/dL — AB (ref 65–99)
Globulin: 2.7 g/dL (calc) (ref 1.9–3.7)
Potassium: 4.7 mmol/L (ref 3.5–5.3)
Sodium: 136 mmol/L (ref 135–146)
Total Protein: 6.6 g/dL (ref 6.1–8.1)

## 2017-07-08 LAB — MICROALBUMIN / CREATININE URINE RATIO
Creatinine,U: 158.8 mg/dL
MICROALB UR: 17.2 mg/dL — AB (ref 0.0–1.9)
Microalb Creat Ratio: 10.8 mg/g (ref 0.0–30.0)

## 2017-07-08 LAB — POCT GLYCOSYLATED HEMOGLOBIN (HGB A1C): Hemoglobin A1C: 9.5

## 2017-07-08 LAB — LIPID PANEL
CHOL/HDL RATIO: 3
CHOLESTEROL: 158 mg/dL (ref 0–200)
HDL: 52.1 mg/dL (ref >39.00)
LDL CALC: 93 mg/dL (ref 0–99)
NonHDL: 105.46
Triglycerides: 63 mg/dL (ref 0.0–149.0)
VLDL: 12.6 mg/dL (ref 0.0–40.0)

## 2017-07-08 MED ORDER — LINAGLIPTIN 5 MG PO TABS: 5.0000 mg | ORAL_TABLET | Freq: Every day | ORAL | 11 refills | Status: DC

## 2017-07-08 MED ORDER — AMLODIPINE BESYLATE 10 MG PO TABS: 10.0000 mg | ORAL_TABLET | Freq: Every day | ORAL | 3 refills | Status: DC

## 2017-07-08 MED ORDER — METFORMIN HCL ER 750 MG PO TB24: 750.0000 mg | ORAL_TABLET | Freq: Two times a day (BID) | ORAL | 3 refills | Status: DC

## 2017-07-08 MED ORDER — GLIPIZIDE 5 MG PO TABS: 5.0000 mg | ORAL_TABLET | Freq: Two times a day (BID) | ORAL | 3 refills | Status: DC

## 2017-07-08 NOTE — Progress Notes (Signed)
Patient ID: [name], female   DOB: 1971/04/14, 46 y.o.   MRN: [id number]  HPI: Lori Fleming is a 46 y.o.-year-old female, initially referred by her PCP, Dr. 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 Last visit 1 year and 3 mo ago!  She was on a plant based diet >> then 3 mo ago restarted regular diet and eating out. She also stopped exercising. She feels that when she was eating a plant-based diet and exercising, her sugars were much better. Sugars higher now. but she is planning to go back to her diet and also restart exercising.  She is working 2 jobs, with only 4.5 hours off between them!  Last hemoglobin A1c was: Lab Results  Component Value Date   HGBA1C 11.7 (H) 01/05/2016   HGBA1C 10.7 (H) 06/09/2015   Pt was on a regimen of: - Metformin ER 750 mg 1x a day, with b'fast. She tried regular Metformin >> frequent stools.  At last visit, we changed to the following (NOT taking any of these, ran out) - Metformin ER 750 mg with dinner. - Glipizide 5 mg 2x a day, 15 min before b'fast and before dinner. - Tradjenta 5 mg before b'fast >> could not start b/c $$$ She refused insulin at last visit and continues to do so.  Pt checked sugars in last 2 weeks: 1(-2)x a day - per review of her log: - am: 149, 164-226, 261 >> 186-256 - 2h after b'fast: n/c - before lunch: n/c >> 158-208 - 2h after lunch: n/c >> 247 - before dinner: n/c >> 310 - 2h after dinner: n/c >> 260 - bedtime: n/c >> 191 - nighttime: n/c Lowest sugar was 149 >> 158 (after exercise). she has hypoglycemia awareness at 46.  Highest sugar was 261 >> 310 (bread).  Glucometer: [place]  Used to drink sweet drinks >> stopped.  She saw Lori Fleming with nutrition.  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  - she has numbness and tingling in her feet. Saw podiatrist in 02/2016: Lori Fleming  ROS: Constitutional: no weight gain/no weight loss, no fatigue, no subjective hyperthermia, no subjective hypothermia Eyes: no blurry vision, no xerophthalmia ENT: no sore throat, no nodules palpated in throat, no dysphagia, no odynophagia, no hoarseness Cardiovascular: no CP/no SOB/no palpitations/no leg swelling Respiratory: no cough/no SOB/no wheezing Gastrointestinal: no N/no V/no D/no C/no acid reflux Musculoskeletal: no muscle aches/no joint aches Skin: no rashes, no hair loss Neurological: no tremors/+ numbness/+ tingling/no dizziness  I reviewed pt's medications, allergies, PMH, social hx, family hx, and changes were documented in the history of present illness. Otherwise, unchanged from my initial visit note.   Past Medical History:  Diagnosis Date  . Diabetes mellitus without complication ([facility])   . Hypertension    No past surgical history on file. Social History   Social History  . Marital Status: Married    Spouse Name: N/A  . Number of Children: no   Occupational History  . Forensic psychologist, [employer]  Social History Main Topics  . Smoking status: Never Smoker   . Smokeless tobacco: Not on file  . Alcohol Use: No  . Drug Use: No   Current Outpatient Prescriptions on File Prior to Visit  Medication Sig Dispense Refill  . amLODipine (NORVASC) 10 MG tablet Take 1 tablet (10 mg total) by mouth daily. 90 tablet 3  . Multiple Vitamins-Minerals (WOMENS MULTI VITAMIN & MINERAL PO) Take by mouth.    Marland Kitchen glipiZIDE (GLUCOTROL) 5 MG tablet Take 1 tablet (5 mg total) by mouth 2 (two) times daily before a meal. (Patient not taking: Reported on 07/08/2017) 60  tablet 2  . linagliptin (TRADJENTA) 5 MG TABS tablet Take 1 tablet (5 mg total) by mouth daily. (Patient not taking: Reported on 07/08/2017) 30 tablet 2  . metFORMIN (GLUCOPHAGE-XR) 750 MG 24 hr tablet Take 1 tablet (750 mg total) by mouth daily with supper. (Patient not taking: Reported on 07/08/2017) 90 tablet 3   No current facility-administered medications on file prior to visit.    No Known Allergies Family History  Problem Relation Age of Onset  . Hypertension Father   . Diabetes Father   . Hypertension Sister   . Mental illness Sister   . Mental illness Brother   . Diabetes Maternal Aunt   . Alcohol abuse Paternal Grandfather    Pt has FH of DM in Father, M aunt.  PE: BP (!) 144/92   Pulse 77   Temp 98.2 F (36.8 C) (Oral)   Wt 161 lb 8 oz (73.3 kg)   LMP 06/13/2017   SpO2 99%   BMI 26.07 kg/m  Wt Readings from Last 3 Encounters:  07/08/17 161 lb 8 oz (73.3 kg)  04/05/16 162 lb (73.5 kg)  03/22/16 163 lb (73.9 kg)   Constitutional: overweight, in NAD Eyes: PERRLA, EOMI, no exophthalmos ENT: moist mucous membranes, no thyromegaly, no cervical lymphadenopathy Cardiovascular: RRR, No MRG Respiratory: CTA B Gastrointestinal: abdomen soft, NT, ND, BS+ Musculoskeletal: no deformities, strength intact in all 4 Skin: moist, warm, no rashes Neurological: no tremor with outstretched hands, DTR normal in all 4  ASSESSMENT: 1. 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 Patient with Uncontrolled diabetes, returning after a long absence. She is working 2 jobs and that he has any time off. Since last visit, she has been on a plant-based diet and also exercising consistently and she mentions that her sugars were much better then. However, approximately 3 months ago, she restarting eating out and not exercising. Subsequently, sugars worsened, but she is determined to get back to her previous diet and exercise routine.  -  As of now, she is off all of her diabetes medicines, but tells me that she was on metformin and glipizide before, but could not get the Tradjenta, since this was too expensive even though covered. At today's visit, we again discussed about the injectables, which she again refuses. I refilled her metformin and advised her to increase the dose to twice a day, and also refilled her glipizide and Tradjenta. I gave her a coupon card for Tradjenta and hopefully we can start this. However, strongly advised her to get back to the plant-based diet and discussed about materials to use.  - I suggested to:  Patient Instructions  Please restart: - Metformin ER 750 mg and increase to 2x a day, with meals - Glipizide 5 mg 2x a day, 15 min before b'fast and before dinner. -  Tradjenta 5 mg before b'fast.  Please return in 3 months with your sugar log.   - today, HbA1c is 9.5% (better) - continue checking sugars at different times of the day - check 1-2x a day, rotating checks - advised for yearly eye exams >> she is not UTD - refuses flu shot - we'll check her annual labs today - Return to clinic in 3 mo with sugar log   - time spent with the patient: 25 min, of which >50% was spent in reviewing her sugar log , discussing her hyper-glycemic episodes, reviewing  previous labs and medication regimen and developing a plan to avoid hypo- and hyper-glycemia.   Component     Latest Ref Rng & Units 07/08/2017  Glucose     65 - 99 mg/dL 195 (H)  BUN     7 - 25 mg/dL 14  Creatinine     0.50 - 1.10 mg/dL 0.63  GFR, Est Non African American     > OR = 60 mL/min/1.69m2 108  GFR, Est African American     > OR = 60 mL/min/1.25m2 126  BUN/Creatinine Ratio     6 - 22 (calc) NOT APPLICABLE  Sodium     384 - 146 mmol/L 136  Potassium     3.5 - 5.3 mmol/L 4.7  Chloride     98 - 110 mmol/L 104  CO2     20 - 32 mmol/L 26  Calcium     8.6 - 10.2 mg/dL 9.4  Total Protein     6.1 - 8.1 g/dL 6.6  Albumin MSPROF      3.6 - 5.1 g/dL 3.9  Globulin     1.9 - 3.7 g/dL (calc) 2.7  AG Ratio     1.0 - 2.5 (calc) 1.4  Total Bilirubin     0.2 - 1.2 mg/dL 0.3  Alkaline phosphatase (APISO)     33 - 115 U/L 61  AST     10 - 35 U/L 10  ALT     6 - 29 U/L 11  Cholesterol     0 - 200 mg/dL 158  Triglycerides     0.0 - 149.0 mg/dL 63.0  HDL Cholesterol     >39.00 mg/dL 52.10  VLDL     0.0 - 40.0 mg/dL 12.6  LDL (calc)     0 - 99 mg/dL 93  Total CHOL/HDL Ratio      3  NonHDL      105.46  Microalb, Ur     0.0 - 1.9 mg/dL 17.2 (H)  Creatinine,U     mg/dL 158.8  MICROALB/CREAT RATIO     0.0 - 30.0 mg/g 10.8  Hemoglobin A1C      9.5   Labs are normal, except high Glu and HbA1c.  Philemon Kingdom, MD PhD [facility]

## 2017-07-08 NOTE — Patient Instructions (Addendum)
Please restart: - Metformin ER 750 mg and increase to 2x a day, with meals - Glipizide 5 mg 2x a day, 15 min before b'fast and before dinner. - Tradjenta 5 mg before b'fast.  Please return in 3 months with your sugar log.

## 2017-07-09 ENCOUNTER — Telehealth: Payer: Self-pay | Admitting: Internal Medicine

## 2017-07-09 ENCOUNTER — Other Ambulatory Visit: Payer: Self-pay

## 2017-07-09 MED ORDER — METFORMIN HCL ER 750 MG PO TB24: 750.0000 mg | ORAL_TABLET | Freq: Two times a day (BID) | ORAL | 3 refills | Status: DC

## 2017-07-09 NOTE — Telephone Encounter (Signed)
Submitted

## 2017-07-09 NOTE — Telephone Encounter (Signed)
Pt called and stated that the pharmacy still does not have her metFORMIN (GLUCOPHAGE-XR) 750 MG 24 hr tablet. Please advise, thank you!  [place], [place] - [address]

## 2017-10-14 ENCOUNTER — Ambulatory Visit: Payer: Self-pay | Admitting: Internal Medicine

## 2017-11-04 LAB — HM DIABETES EYE EXAM

## 2018-04-07 ENCOUNTER — Ambulatory Visit (INDEPENDENT_AMBULATORY_CARE_PROVIDER_SITE_OTHER): Payer: 59 | Admitting: Internal Medicine

## 2018-04-07 ENCOUNTER — Other Ambulatory Visit (INDEPENDENT_AMBULATORY_CARE_PROVIDER_SITE_OTHER): Payer: 59

## 2018-04-07 ENCOUNTER — Encounter: Payer: Self-pay | Admitting: Internal Medicine

## 2018-04-07 VITALS — BP 124/82 | HR 98 | Temp 98.3°F | Ht 66.0 in | Wt 159.0 lb

## 2018-04-07 DIAGNOSIS — Z1211 Encounter for screening for malignant neoplasm of colon: Secondary | ICD-10-CM | POA: Insufficient documentation

## 2018-04-07 DIAGNOSIS — E1165 Type 2 diabetes mellitus with hyperglycemia: Secondary | ICD-10-CM

## 2018-04-07 DIAGNOSIS — I10 Essential (primary) hypertension: Secondary | ICD-10-CM | POA: Diagnosis not present

## 2018-04-07 DIAGNOSIS — Z0000 Encounter for general adult medical examination without abnormal findings: Secondary | ICD-10-CM | POA: Diagnosis not present

## 2018-04-07 DIAGNOSIS — Z23 Encounter for immunization: Secondary | ICD-10-CM

## 2018-04-07 DIAGNOSIS — E113399 Type 2 diabetes mellitus with moderate nonproliferative diabetic retinopathy without macular edema, unspecified eye: Secondary | ICD-10-CM

## 2018-04-07 DIAGNOSIS — IMO0002 Reserved for concepts with insufficient information to code with codable children: Secondary | ICD-10-CM

## 2018-04-07 LAB — COMPREHENSIVE METABOLIC PANEL
ALBUMIN: 4 g/dL (ref 3.5–5.2)
ALT: 9 U/L (ref 0–35)
AST: 10 U/L (ref 0–37)
Alkaline Phosphatase: 66 U/L (ref 39–117)
BUN: 11 mg/dL (ref 6–23)
CALCIUM: 9.2 mg/dL (ref 8.4–10.5)
CHLORIDE: 103 meq/L (ref 96–112)
CO2: 26 meq/L (ref 19–32)
Creatinine, Ser: 0.76 mg/dL (ref 0.40–1.20)
GFR: 105.1 mL/min (ref >60.00)
Glucose, Bld: 189 mg/dL — ABNORMAL HIGH (ref 70–99)
POTASSIUM: 4.6 meq/L (ref 3.5–5.1)
Sodium: 136 mEq/L (ref 135–145)
Total Bilirubin: 0.4 mg/dL (ref 0.2–1.2)
Total Protein: 7.2 g/dL (ref 6.0–8.3)

## 2018-04-07 LAB — CBC
HEMATOCRIT: 37 % (ref 36.0–46.0)
HEMOGLOBIN: 12.3 g/dL (ref 12.0–15.0)
MCHC: 33.2 g/dL (ref 30.0–36.0)
MCV: 83.8 fl (ref 78.0–100.0)
PLATELETS: 415 10*3/uL — AB (ref 150.0–400.0)
RBC: 4.41 Mil/uL (ref 3.87–5.11)
RDW: 14.6 % (ref 11.5–15.5)
WBC: 6.4 10*3/uL (ref 4.0–10.5)

## 2018-04-07 LAB — LIPID PANEL
CHOL/HDL RATIO: 3
CHOLESTEROL: 167 mg/dL (ref 0–200)
HDL: 49.9 mg/dL (ref >39.00)
LDL CALC: 104 mg/dL — AB (ref 0–99)
NonHDL: 116.89
TRIGLYCERIDES: 63 mg/dL (ref 0.0–149.0)
VLDL: 12.6 mg/dL (ref 0.0–40.0)

## 2018-04-07 LAB — MICROALBUMIN / CREATININE URINE RATIO
Creatinine,U: 202.8 mg/dL
MICROALB UR: 94.9 mg/dL — AB (ref 0.0–1.9)
MICROALB/CREAT RATIO: 46.8 mg/g — AB (ref 0.0–30.0)

## 2018-04-07 LAB — HEMOGLOBIN A1C: Hgb A1c MFr Bld: 9.9 % — ABNORMAL HIGH (ref 4.6–6.5)

## 2018-04-07 MED ORDER — AMLODIPINE BESYLATE 10 MG PO TABS: 10.0000 mg | ORAL_TABLET | Freq: Every day | ORAL | 3 refills | Status: DC

## 2018-04-07 NOTE — Assessment & Plan Note (Signed)
BP at goal on amlodipine. Having mild foot swelling and offered change as this could be related but she declines today. If switching needs to switch to ACE-I or ARB given diabetes.

## 2018-04-07 NOTE — Assessment & Plan Note (Signed)
Checking microalbumin to creatinine ratio, HgA1c, CMP, lipid panel. She is not on ACE-I/ARB or statin. She does have complications of retinopathy and new neuropathy. Last HgA1c >9%. She is not taking medications as prescribed. Metformin daily, tradjenta daily and glipizide when she thinks she needs it. Adjust as needed.

## 2018-04-07 NOTE — Assessment & Plan Note (Signed)
Pneumonia 23 and Tdap given at visit. Checking labs. She does not get flu shot. Pap smear not up to date and we asked her to return to her ob/gyn. She has not had recent mammogram. Foot exam done. Counseled about the long term side effects of poorly controlled diabetes which she has new neuropathy today. Given screening recommendations.

## 2018-04-07 NOTE — Progress Notes (Signed)
   Subjective:    Patient ID: Lori Fleming, female    DOB: 1971-06-01, 47 y.o.   MRN: [id number]  HPI The patient is a 47 YO female coming in for physical. Has not been seen for almost 2 years. Had seen endo for diabetes but last visit almost 1 year ago (with last HgA1c >9%!). She is taking metformin mostly 1 time per day as she cannot remember second dosing. Taking tradjenta but not glipizide. She takes glipizide a couple times per month when her morning sugar is >200. She reports morning sugars from 150-250 usually. Some new numbness and burning in her feet.   PMH, [facility], social history reviewed and updated.   Review of Systems  Constitutional: Negative.   HENT: Negative.   Eyes: Negative.   Respiratory: Negative for cough, chest tightness and shortness of breath.   Cardiovascular: Negative for chest pain, palpitations and leg swelling.  Gastrointestinal: Negative for abdominal distention, abdominal pain, constipation, diarrhea, nausea and vomiting.  Musculoskeletal: Negative.   Skin: Negative.   Neurological: Positive for numbness.  Psychiatric/Behavioral: Negative.       Objective:   Physical Exam  Constitutional: She is oriented to person, place, and time. She appears well-developed and well-nourished.  HENT:  Head: Normocephalic and atraumatic.  Eyes: EOM are normal.  Neck: Normal range of motion.  Cardiovascular: Normal rate and regular rhythm.  Pulmonary/Chest: Effort normal and breath sounds normal. No respiratory distress. She has no wheezes. She has no rales.  Abdominal: Soft. Bowel sounds are normal. She exhibits no distension. There is no tenderness. There is no rebound.  Musculoskeletal: She exhibits no edema.  Neurological: She is alert and oriented to person, place, and time. Coordination normal.  Skin: Skin is warm and dry.  See foot exam  Psychiatric: She has a normal mood and affect.   Vitals:   04/07/18 0907  BP: 124/82  Pulse: 98  Temp: 98.3 F  (36.8 C)  TempSrc: Oral  SpO2: 99%  Weight: 159 lb (72.1 kg)  Height: 5\' 6"  (1.676 m)      Assessment & Plan:  Pneumonia 23 and Tdap given at visit

## 2018-04-07 NOTE — Patient Instructions (Signed)
We are checking the blood work today and may need to make changes to the medicine depending on the sugar levels.   If you sugars are still high as they were before we will call you with changes.   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 This could go away if we can get the sugars to goal or could be a permanent change.   Diabetes Mellitus and Standards of Medical Care Managing diabetes (diabetes mellitus) can be complicated. Your diabetes treatment may be managed by a team of health care providers, including:  A diet and nutrition specialist (registered dietitian).  A nurse.  A certified diabetes educator (CDE).  A diabetes specialist (endocrinologist).  An eye doctor.  A primary care provider.  A dentist.  Your health care providers follow a schedule in order to help you get the best quality of care. The following schedule is a general guideline for your diabetes management plan. Your health care providers may also give you more specific instructions. HbA1c ( hemoglobin A1c) test This test provides information about blood sugar (glucose) control over the previous 2-3 months. It is used to check whether your diabetes management plan needs to be adjusted.  If you are meeting your treatment goals, this test is done at least 2 times a year.  If you are not meeting treatment goals or if your treatment goals have changed, this test is done 4 times a year.  Blood pressure test  This test is done at every routine medical visit. For most people, the goal is less than 130/80. Ask your health care provider what your goal blood pressure should be. Dental and eye exams  Visit your dentist two times a year.  If you have type 1 diabetes, get an eye exam 3-5 years after you are diagnosed, and then once a year after your first exam. ? If you were diagnosed with type 1 diabetes as a child, get an eye exam when you are age 19 or older  and have had diabetes for 3-5 years. After the first exam, you should get an eye exam once a year.  If you have type 2 diabetes, have an eye exam as soon as you are diagnosed, and then once a year after your first exam. Foot care exam  Visual foot exams are done at every routine medical visit. The exams check for cuts, bruises, redness, blisters, sores, or other problems with the feet.  A complete foot exam is done by your health care provider once a year. This exam includes an inspection of the structure and skin of your feet, and a check of the pulses and sensation in your feet. ? Type 1 diabetes: Get your first exam 3-5 years after diagnosis. ? Type 2 diabetes: Get your first exam as soon as you are diagnosed.  Check your feet every day for cuts, bruises, redness, blisters, or sores. If you have any of these or other problems that are not healing, contact your health care provider. Kidney function test ( urine microalbumin)  This test is done once a year. ? Type 1 diabetes: Get your first test 5 years after diagnosis. ? Type 2 diabetes: Get your first test as soon as you are diagnosed.  If you have chronic kidney disease (CKD), get a serum creatinine and estimated glomerular filtration rate (eGFR) test once a year. Lipid profile (cholesterol, HDL, LDL, triglycerides)  This test should be done when you  are diagnosed with diabetes, and every 5 years after the first test. If you are on medicines to lower your cholesterol, you may need to get this test done every year. ? The goal for LDL is less than 100 mg/dL (5.5 mmol/L). If you are at high risk, the goal is less than 70 mg/dL (3.9 mmol/L). ? The goal for HDL is 40 mg/dL (2.2 mmol/L) for men and 50 mg/dL(2.8 mmol/L) for women. An HDL cholesterol of 60 mg/dL (3.3 mmol/L) or higher gives some protection against heart disease. ? The goal for triglycerides is less than 150 mg/dL (8.3 mmol/L). Immunizations  The yearly flu (influenza) vaccine  is recommended for everyone 6 months or older who has diabetes.  The pneumonia (pneumococcal) vaccine is recommended for everyone 2 years or older who has diabetes. If you are 92 or older, you may get the pneumonia vaccine as a series of two separate shots.  The hepatitis B vaccine is recommended for adults shortly after they have been diagnosed with diabetes.  The Tdap (tetanus, diphtheria, and pertussis) vaccine should be given: ? According to normal childhood vaccination schedules, for children. ? Every 10 years, for adults who have diabetes.  The shingles vaccine is recommended for people who have had chicken pox and are 50 years or older. Mental and emotional health  Screening for symptoms of eating disorders, anxiety, and depression is recommended at the time of diagnosis and afterward as needed. If your screening shows that you have symptoms (you have a positive screening result), you may need further evaluation and be referred to a mental health care provider. Diabetes self-management education  Education about how to manage your diabetes is recommended at diagnosis and ongoing as needed. Treatment plan  Your treatment plan will be reviewed at every medical visit. Summary  Managing diabetes (diabetes mellitus) can be complicated. Your diabetes treatment may be managed by a team of health care providers.  Your health care providers follow a schedule in order to help you get the best quality of care.  Standards of care including having regular physical exams, blood tests, blood pressure monitoring, immunizations, screening tests, and education about how to manage your diabetes.  Your health care providers may also give you more specific instructions based on your individual health. This information is not intended to replace advice given to you by your health care provider. Make sure you discuss any questions you have with your health care provider. Document Released: 07/08/2009  Document Revised: 06/08/2016 Document Reviewed: 06/08/2016 Elsevier Interactive Patient Education  2018 [place] Maintenance, Female Adopting a healthy lifestyle and getting preventive care can go a long way to promote health and wellness. Talk with your health care provider about what schedule of regular examinations is right for you. This is a good chance for you to check in with your provider about disease prevention and staying healthy. In between checkups, there are plenty of things you can do on your own. Experts have done a lot of research about which lifestyle changes and preventive measures are most likely to keep you healthy. Ask your health care provider for more information. Weight and diet Eat a healthy diet  Be sure to include plenty of vegetables, fruits, low-fat dairy products, and lean protein.  Do not eat a lot of foods high in solid fats, added sugars, or salt.  Get regular exercise. This is one of the most important things you can do for your health. ? Most adults should exercise  for at least 150 minutes each week. The exercise should increase your heart rate and make you sweat (moderate-intensity exercise). ? Most adults should also do strengthening exercises at least twice a week. This is in addition to the moderate-intensity exercise.  Maintain a healthy weight  Body mass index (BMI) is a measurement that can be used to identify possible weight problems. It estimates body fat based on height and weight. Your health care provider can help determine your BMI and help you achieve or maintain a healthy weight.  For females 78 years of age and older: ? A BMI below 18.5 is considered underweight. ? A BMI of 18.5 to 24.9 is normal. ? A BMI of 25 to 29.9 is considered overweight. ? A BMI of 30 and above is considered obese.  Watch levels of cholesterol and blood lipids  You should start having your blood tested for lipids and cholesterol at 47 years of age,  then have this test every 5 years.  You may need to have your cholesterol levels checked more often if: ? Your lipid or cholesterol levels are high. ? You are older than 47 years of age. ? You are at high risk for heart disease.  Cancer screening Lung Cancer  Lung cancer screening is recommended for adults 61-21 years old who are at high risk for lung cancer because of a history of smoking.  A yearly low-dose CT scan of the lungs is recommended for people who: ? Currently smoke. ? Have quit within the past 15 years. ? Have at least a 30-pack-year history of smoking. A pack year is smoking an average of one pack of cigarettes a day for 1 year.  Yearly screening should continue until it has been 15 years since you quit.  Yearly screening should stop if you develop a health problem that would prevent you from having lung cancer treatment.  Breast Cancer  Practice breast self-awareness. This means understanding how your breasts normally appear and feel.  It also means doing regular breast self-exams. Let your health care provider know about any changes, no matter how small.  If you are in your 20s or 30s, you should have a clinical breast exam (CBE) by a health care provider every 1-3 years as part of a regular health exam.  If you are 48 or older, have a CBE every year. Also consider having a breast X-ray (mammogram) every year.  If you have a family history of breast cancer, talk to your health care provider about genetic screening.  If you are at high risk for breast cancer, talk to your health care provider about having an MRI and a mammogram every year.  Breast cancer gene (BRCA) assessment is recommended for women who have family members with BRCA-related cancers. BRCA-related cancers include: ? Breast. ? Ovarian. ? Tubal. ? Peritoneal cancers.  Results of the assessment will determine the need for genetic counseling and BRCA1 and BRCA2 testing.  Cervical Cancer Your  health care provider may recommend that you be screened regularly for cancer of the pelvic organs (ovaries, uterus, and vagina). This screening involves a pelvic examination, including checking for microscopic changes to the surface of your cervix (Pap test). You may be encouraged to have this screening done every 3 years, beginning at age 14.  For women ages 30-65, health care providers may recommend pelvic exams and Pap testing every 3 years, or they may recommend the Pap and pelvic exam, combined with testing for human papilloma virus (HPV), every  5 years. Some types of HPV increase your risk of cervical cancer. Testing for HPV may also be done on women of any age with unclear Pap test results.  Other health care providers may not recommend any screening for nonpregnant women who are considered low risk for pelvic cancer and who do not have symptoms. Ask your health care provider if a screening pelvic exam is right for you.  If you have had past treatment for cervical cancer or a condition that could lead to cancer, you need Pap tests and screening for cancer for at least 20 years after your treatment. If Pap tests have been discontinued, your risk factors (such as having a new sexual partner) need to be reassessed to determine if screening should resume. Some women have medical problems that increase the chance of getting cervical cancer. In these cases, your health care provider may recommend more frequent screening and Pap tests.  Colorectal Cancer  This type of cancer can be detected and often prevented.  Routine colorectal cancer screening usually begins at 47 years of age and continues through 47 years of age.  Your health care provider may recommend screening at an earlier age if you have risk factors for colon cancer.  Your health care provider may also recommend using home test kits to check for hidden blood in the stool.  A small camera at the end of a tube can be used to examine your  colon directly (sigmoidoscopy or colonoscopy). This is done to check for the earliest forms of colorectal cancer.  Routine screening usually begins at age 67.  Direct examination of the colon should be repeated every 5-10 years through 47 years of age. However, you may need to be screened more often if early forms of precancerous polyps or small growths are found.  Skin Cancer  Check your skin from head to toe regularly.  Tell your health care provider about any new moles or changes in moles, especially if there is a change in a mole's shape or color.  Also tell your health care provider if you have a mole that is larger than the size of a pencil eraser.  Always use sunscreen. Apply sunscreen liberally and repeatedly throughout the day.  Protect yourself by wearing long sleeves, pants, a wide-brimmed hat, and sunglasses whenever you are outside.  Heart disease, diabetes, and high blood pressure  High blood pressure causes heart disease and increases the risk of stroke. High blood pressure is more likely to develop in: ? People who have blood pressure in the high end of the normal range (130-139/85-89 mm Hg). ? People who are overweight or obese. ? People who are African American.  If you are 90-82 years of age, have your blood pressure checked every 3-5 years. If you are 26 years of age or older, have your blood pressure checked every year. You should have your blood pressure measured twice-once when you are at a hospital or clinic, and once when you are not at a hospital or clinic. Record the average of the two measurements. To check your blood pressure when you are not at a hospital or clinic, you can use: ? An automated blood pressure machine at a pharmacy. ? A home blood pressure monitor.  If you are between 72 years and 84 years old, ask your health care provider if you should take aspirin to prevent strokes.  Have regular diabetes screenings. This involves taking a blood sample  to check your fasting blood sugar level. ?  If you are at a normal weight and have a low risk for diabetes, have this test once every three years after 47 years of age. ? If you are overweight and have a high risk for diabetes, consider being tested at a younger age or more often. Preventing infection Hepatitis B  If you have a higher risk for hepatitis B, you should be screened for this virus. You are considered at high risk for hepatitis B if: ? You were born in a country where hepatitis B is common. Ask your health care provider which countries are considered high risk. ? Your parents were born in a high-risk country, and you have not been immunized against hepatitis B (hepatitis B vaccine). ? You have HIV or AIDS. ? You use needles to inject street drugs. ? You live with someone who has hepatitis B. ? You have had sex with someone who has hepatitis B. ? You get hemodialysis treatment. ? You take certain medicines for conditions, including cancer, organ transplantation, and autoimmune conditions.  Hepatitis C  Blood testing is recommended for: ? Everyone born from [birth year] through 1965. ? Anyone with known risk factors for hepatitis C.  Sexually transmitted infections (STIs)  You should be screened for sexually transmitted infections (STIs) including gonorrhea and chlamydia if: ? You are sexually active and are younger than 48 years of age. ? You are older than 47 years of age and your health care provider tells you that you are at risk for this type of infection. ? Your sexual activity has changed since you were last screened and you are at an increased risk for chlamydia or gonorrhea. Ask your health care provider if you are at risk.  If you do not have HIV, but are at risk, it may be recommended that you take a prescription medicine daily to prevent HIV infection. This is called pre-exposure prophylaxis (PrEP). You are considered at risk if: ? You are sexually active and do not  regularly use condoms or know the HIV status of your partner(s). ? You take drugs by injection. ? You are sexually active with a partner who has HIV.  Talk with your health care provider about whether you are at high risk of being infected with HIV. If you choose to begin PrEP, you should first be tested for HIV. You should then be tested every 3 months for as long as you are taking PrEP. Pregnancy  If you are premenopausal and you may become pregnant, ask your health care provider about preconception counseling.  If you may become pregnant, take 400 to 800 micrograms (mcg) of folic acid every day.  If you want to prevent pregnancy, talk to your health care provider about birth control (contraception). Osteoporosis and menopause  Osteoporosis is a disease in which the bones lose minerals and strength with aging. This can result in serious bone fractures. Your risk for osteoporosis can be identified using a bone density scan.  If you are 74 years of age or older, or if you are at risk for osteoporosis and fractures, ask your health care provider if you should be screened.  Ask your health care provider whether you should take a calcium or vitamin D supplement to lower your risk for osteoporosis.  Menopause may have certain physical symptoms and risks.  Hormone replacement therapy may reduce some of these symptoms and risks. Talk to your health care provider about whether hormone replacement therapy is right for you. Follow these instructions at home:  Schedule  regular health, dental, and eye exams.  Stay current with your immunizations.  Do not use any tobacco products including cigarettes, chewing tobacco, or electronic cigarettes.  If you are pregnant, do not drink alcohol.  If you are breastfeeding, limit how much and how often you drink alcohol.  Limit alcohol intake to no more than 1 drink per day for nonpregnant women. One drink equals 12 ounces of beer, 5 ounces of wine, or  1 ounces of hard liquor.  Do not use street drugs.  Do not share needles.  Ask your health care provider for help if you need support or information about quitting drugs.  Tell your health care provider if you often feel depressed.  Tell your health care provider if you have ever been abused or do not feel safe at home. This information is not intended to replace advice given to you by your health care provider. Make sure you discuss any questions you have with your health care provider. Document Released: 03/26/2011 Document Revised: 02/16/2016 Document Reviewed: 06/14/2015 Elsevier Interactive Patient Education  [employer].

## 2018-04-08 ENCOUNTER — Other Ambulatory Visit: Payer: Self-pay | Admitting: Internal Medicine

## 2018-04-08 LAB — HIV ANTIBODY (ROUTINE TESTING W REFLEX): HIV: NONREACTIVE

## 2018-04-08 MED ORDER — METFORMIN HCL ER 750 MG PO TB24: 750.0000 mg | ORAL_TABLET | Freq: Two times a day (BID) | ORAL | 0 refills | Status: DC

## 2018-04-21 ENCOUNTER — Ambulatory Visit (INDEPENDENT_AMBULATORY_CARE_PROVIDER_SITE_OTHER): Payer: 59 | Admitting: Podiatry

## 2018-04-21 ENCOUNTER — Encounter: Payer: Self-pay | Admitting: Podiatry

## 2018-04-21 DIAGNOSIS — I10 Essential (primary) hypertension: Secondary | ICD-10-CM | POA: Diagnosis not present

## 2018-04-21 DIAGNOSIS — E0843 Diabetes mellitus due to underlying condition with diabetic autonomic (poly)neuropathy: Secondary | ICD-10-CM

## 2018-04-26 NOTE — Progress Notes (Signed)
   HPI: 47 year old female presenting today for a diabetic foot exam. She reports some swelling of the bilateral lower extremities but denies any modifying factors. She denies any pain at this time. Patient is here for further evaluation and treatment.    Past Medical History:  Diagnosis Date  . Diabetes mellitus without complication ([place])   . Hypertension      Physical Exam: General: The patient is alert and oriented x3 in no acute distress.  Dermatology: Skin is warm, dry and supple bilateral lower extremities. Negative for open lesions or macerations.  Vascular: Palpable pedal pulses bilaterally. No edema or erythema noted. Capillary refill within normal limits.  Neurological: Epicritic and protective threshold diminished bilaterally.   Musculoskeletal Exam: Range of motion within normal limits to all pedal and ankle joints bilateral. Muscle strength 5/5 in all groups bilateral.     Assessment: 1. DM type II, uncontrolled 2. Essential HTN 3. BLE edema   Plan of Care:  1. Patient evaluated.  2. Comprehensive lower extremity exam performed.  3. Recommended good supportive shoe gear.  4. Recommended compressions socks daily.  5. Maintain good foot hygiene.  6. Return to clinic annually.      Edrick Kins, DPM [facility]  Dr. Edrick Kins, DPM    [address], [place] [address]                Office [phone]  Fax [phone]

## 2018-05-20 ENCOUNTER — Encounter: Payer: Self-pay | Admitting: Internal Medicine

## 2018-05-20 LAB — HM DIABETES EYE EXAM

## 2018-07-14 ENCOUNTER — Ambulatory Visit: Payer: Self-pay | Admitting: Internal Medicine

## 2019-01-13 ENCOUNTER — Other Ambulatory Visit: Payer: Self-pay

## 2019-01-13 MED ORDER — GLIPIZIDE 5 MG PO TABS: 5.0000 mg | ORAL_TABLET | Freq: Two times a day (BID) | ORAL | 0 refills | Status: DC

## 2019-01-13 MED ORDER — METFORMIN HCL ER 750 MG PO TB24: 750.0000 mg | ORAL_TABLET | Freq: Two times a day (BID) | ORAL | 0 refills | Status: DC

## 2019-01-15 ENCOUNTER — Ambulatory Visit: Payer: Self-pay | Admitting: Internal Medicine

## 2019-04-08 ENCOUNTER — Other Ambulatory Visit: Payer: Self-pay | Admitting: Internal Medicine

## 2019-04-29 ENCOUNTER — Other Ambulatory Visit: Payer: Self-pay | Admitting: Internal Medicine

## 2019-05-24 ENCOUNTER — Other Ambulatory Visit: Payer: Self-pay | Admitting: Internal Medicine

## 2019-05-26 LAB — HM DIABETES EYE EXAM

## 2020-01-08 DIAGNOSIS — H2513 Age-related nuclear cataract, bilateral: Secondary | ICD-10-CM | POA: Diagnosis not present

## 2020-01-08 DIAGNOSIS — E113593 Type 2 diabetes mellitus with proliferative diabetic retinopathy without macular edema, bilateral: Secondary | ICD-10-CM | POA: Diagnosis not present

## 2020-01-08 DIAGNOSIS — H25013 Cortical age-related cataract, bilateral: Secondary | ICD-10-CM | POA: Diagnosis not present

## 2020-10-01 DIAGNOSIS — Z1152 Encounter for screening for COVID-19: Secondary | ICD-10-CM | POA: Diagnosis not present

## 2020-10-12 DIAGNOSIS — H524 Presbyopia: Secondary | ICD-10-CM | POA: Diagnosis not present

## 2020-10-12 DIAGNOSIS — H35373 Puckering of macula, bilateral: Secondary | ICD-10-CM | POA: Diagnosis not present

## 2020-10-12 DIAGNOSIS — E113593 Type 2 diabetes mellitus with proliferative diabetic retinopathy without macular edema, bilateral: Secondary | ICD-10-CM | POA: Diagnosis not present

## 2020-10-12 DIAGNOSIS — H2513 Age-related nuclear cataract, bilateral: Secondary | ICD-10-CM | POA: Diagnosis not present

## 2020-10-21 ENCOUNTER — Ambulatory Visit (INDEPENDENT_AMBULATORY_CARE_PROVIDER_SITE_OTHER): Payer: BC Managed Care – PPO | Admitting: Internal Medicine

## 2020-10-21 ENCOUNTER — Encounter: Payer: Self-pay | Admitting: Internal Medicine

## 2020-10-21 ENCOUNTER — Other Ambulatory Visit: Payer: Self-pay

## 2020-10-21 VITALS — BP 160/90 | HR 107 | Temp 98.6°F | Resp 18 | Ht 66.0 in | Wt 156.8 lb

## 2020-10-21 DIAGNOSIS — I10 Essential (primary) hypertension: Secondary | ICD-10-CM | POA: Diagnosis not present

## 2020-10-21 DIAGNOSIS — E1165 Type 2 diabetes mellitus with hyperglycemia: Secondary | ICD-10-CM

## 2020-10-21 DIAGNOSIS — IMO0002 Reserved for concepts with insufficient information to code with codable children: Secondary | ICD-10-CM

## 2020-10-21 DIAGNOSIS — E1169 Type 2 diabetes mellitus with other specified complication: Secondary | ICD-10-CM | POA: Diagnosis not present

## 2020-10-21 DIAGNOSIS — E785 Hyperlipidemia, unspecified: Secondary | ICD-10-CM | POA: Insufficient documentation

## 2020-10-21 DIAGNOSIS — E113399 Type 2 diabetes mellitus with moderate nonproliferative diabetic retinopathy without macular edema, unspecified eye: Secondary | ICD-10-CM

## 2020-10-21 MED ORDER — LOSARTAN POTASSIUM-HCTZ 100-25 MG PO TABS
1.0000 | ORAL_TABLET | Freq: Every day | ORAL | 1 refills | Status: DC
Start: 2020-10-21 — End: 2021-04-19

## 2020-10-21 NOTE — Assessment & Plan Note (Signed)
BP is moderately elevated in the office with severely elevated readings at home. Rx losartan/hctz to help BP to get to goal of 130/80 or could even have goal 120/80. Checking CMP for complications today. Needs follow up 3 weeks with BP check and repeat labs.

## 2020-10-21 NOTE — Patient Instructions (Signed)
We have sent in losartan/hctz to take 1 pill daily for blood pressure.   We will check the labs and will likely restart medicine for the blood sugars.   We want to see you back in about 3 weeks to recheck the blood pressure.

## 2020-10-21 NOTE — Assessment & Plan Note (Signed)
Not on statin currently. With recent symptoms concerning for hypertensive urgency versus TIA symptoms. Checking lipid panel and adjust as needed.

## 2020-10-21 NOTE — Assessment & Plan Note (Signed)
Checking HgA1c, microalbumin to creatinine ratio and lipid panel. Will likely need adjustment as she is taking no medications currently. Foot exam done.

## 2020-10-21 NOTE — Progress Notes (Signed)
   Subjective:   Patient ID: Lori Fleming, female    DOB: 02-27-1971, 50 y.o.   MRN: [id number]  HPI The patient is a 50 YO female coming in for follow up blood pressure (last seen 2019 and not on meds currently, having some headaches and episode of loss of vision, saw her eye doctor and they told her she needed to come in, some bleeding behind the eye, denies current headache or chest pains, BP running 190 or 200 at home sometimes), and diabetes (not on meds currently, previous not at goal, denies neuropathy, prior retinopathy which sounds to have progressed per patient report, not monitoring sugars at home), and cholesterol (not taking anything, denies chest pains, recent episode of loss of vision temporary).   Review of Systems  Constitutional: Negative.   HENT: Negative.   Eyes: Positive for visual disturbance.  Respiratory: Negative for cough, chest tightness and shortness of breath.   Cardiovascular: Negative for chest pain, palpitations and leg swelling.  Gastrointestinal: Negative for abdominal distention, abdominal pain, constipation, diarrhea, nausea and vomiting.  Musculoskeletal: Negative.   Skin: Negative.   Neurological: Positive for headaches.  Psychiatric/Behavioral: Negative.     Objective:  Physical Exam Constitutional:      Appearance: She is well-developed and well-nourished.  HENT:     Head: Normocephalic and atraumatic.  Eyes:     Extraocular Movements: EOM normal.  Cardiovascular:     Rate and Rhythm: Normal rate and regular rhythm.  Pulmonary:     Effort: Pulmonary effort is normal. No respiratory distress.     Breath sounds: Normal breath sounds. No wheezing or rales.  Abdominal:     General: Bowel sounds are normal. There is no distension.     Palpations: Abdomen is soft.     Tenderness: There is no abdominal tenderness. There is no rebound.  Musculoskeletal:        General: No edema.     Cervical back: Normal range of motion.  Skin:     General: Skin is warm and dry.     Comments: Foot exam done  Neurological:     Mental Status: She is alert and oriented to person, place, and time.     Coordination: Coordination normal.  Psychiatric:        Mood and Affect: Mood and affect normal.     Vitals:   10/21/20 1607  BP: (!) 160/90  Pulse: (!) 107  Resp: 18  Temp: 98.6 F (37 C)  TempSrc: Oral  SpO2: 97%  Weight: 156 lb 12.8 oz (71.1 kg)  Height: '5\' 6"'$  (1.676 m)    This visit occurred during the SARS-CoV-2 public health emergency.  Safety protocols were in place, including screening questions prior to the visit, additional usage of staff PPE, and extensive cleaning of exam room while observing appropriate contact time as indicated for disinfecting solutions.   Assessment & Plan:

## 2020-10-22 LAB — CBC
HCT: 32.2 % — ABNORMAL LOW (ref 35.0–45.0)
Hemoglobin: 11 g/dL — ABNORMAL LOW (ref 11.7–15.5)
MCH: 29.1 pg (ref 27.0–33.0)
MCHC: 34.2 g/dL (ref 32.0–36.0)
MCV: 85.2 fL (ref 80.0–100.0)
MPV: 10.6 fL (ref 7.5–12.5)
Platelets: 498 10*3/uL — ABNORMAL HIGH (ref 140–400)
RBC: 3.78 10*6/uL — ABNORMAL LOW (ref 3.80–5.10)
RDW: 12.5 % (ref 11.0–15.0)
WBC: 8.7 10*3/uL (ref 3.8–10.8)

## 2020-10-22 LAB — LIPID PANEL
Cholesterol: 276 mg/dL — ABNORMAL HIGH (ref <200)
HDL: 48 mg/dL — ABNORMAL LOW (ref >=50)
LDL Cholesterol (Calc): 187 mg/dL (calc) — ABNORMAL HIGH
Non-HDL Cholesterol (Calc): 228 mg/dL (calc) — ABNORMAL HIGH (ref <130)
Total CHOL/HDL Ratio: 5.8 (calc) — ABNORMAL HIGH (ref <5.0)
Triglycerides: 224 mg/dL — ABNORMAL HIGH (ref <150)

## 2020-10-22 LAB — COMPREHENSIVE METABOLIC PANEL
AG Ratio: 1.4 (calc) (ref 1.0–2.5)
ALT: 10 U/L (ref 6–29)
AST: 11 U/L (ref 10–35)
Albumin: 3.8 g/dL (ref 3.6–5.1)
Alkaline phosphatase (APISO): 61 U/L (ref 31–125)
BUN: 17 mg/dL (ref 7–25)
CO2: 26 mmol/L (ref 20–32)
Calcium: 9.7 mg/dL (ref 8.6–10.2)
Chloride: 101 mmol/L (ref 98–110)
Creat: 1.06 mg/dL (ref 0.50–1.10)
Globulin: 2.7 g/dL (calc) (ref 1.9–3.7)
Glucose, Bld: 183 mg/dL — ABNORMAL HIGH (ref 65–99)
Potassium: 4.1 mmol/L (ref 3.5–5.3)
Sodium: 136 mmol/L (ref 135–146)
Total Bilirubin: 0.4 mg/dL (ref 0.2–1.2)
Total Protein: 6.5 g/dL (ref 6.1–8.1)

## 2020-10-22 LAB — HEMOGLOBIN A1C
Hgb A1c MFr Bld: 10.5 % of total Hgb — ABNORMAL HIGH (ref <5.7)
Mean Plasma Glucose: 255 mg/dL
eAG (mmol/L): 14.1 mmol/L

## 2020-10-22 LAB — MICROALBUMIN / CREATININE URINE RATIO
Creatinine, Urine: 54 mg/dL (ref 20–275)
Microalb Creat Ratio: 4711 ug/mg{creat} — ABNORMAL HIGH
Microalb, Ur: 254.4 mg/dL

## 2020-10-28 ENCOUNTER — Telehealth: Payer: Self-pay | Admitting: Internal Medicine

## 2020-10-28 MED ORDER — EMPAGLIFLOZIN 25 MG PO TABS: 25.0000 mg | ORAL_TABLET | Freq: Every day | ORAL | 1 refills | Status: DC

## 2020-10-28 MED ORDER — METFORMIN HCL ER 750 MG PO TB24: 750.0000 mg | ORAL_TABLET | Freq: Two times a day (BID) | ORAL | 1 refills | Status: DC

## 2020-10-28 MED ORDER — SIMVASTATIN 20 MG PO TABS: 20.0000 mg | ORAL_TABLET | Freq: Every day | ORAL | 3 refills | Status: DC

## 2020-10-28 NOTE — Telephone Encounter (Signed)
Patient called and was wondering if metFORMIN (GLUCOPHAGE-XR) 750 MG 24 hr tablet and Jardiance could be called into [id number] [place] [address] Please advise.

## 2020-10-28 NOTE — Telephone Encounter (Signed)
Sent in both as well as cholesterol medicines. Should have follow up visit 3 months.

## 2020-10-28 NOTE — Telephone Encounter (Signed)
See below

## 2020-10-31 NOTE — Telephone Encounter (Signed)
Phone went straight to voicemail. LVM letting her know that Dr. Sharlet Salina has sent her refills to the pharmacy. I also advised that the patient call back to schedule her 3 month follow up. Office number was provided.   If patient calls back please schedule her a 3 month follow up with Dr. Sharlet Salina.

## 2020-11-02 ENCOUNTER — Telehealth: Payer: Self-pay

## 2020-11-02 NOTE — Telephone Encounter (Signed)
Received email from pt. Picked up flyer from [facility]). Interested in participating in [place].  Has not yet reached to MD for referral.  Okay with patient for me to send request for a referral to Dr Sharlet Salina.  Next [facility] class will start 11/15/20 615p-730p.  Will scan to fax to office for okay to work with patient in [place]

## 2020-11-03 ENCOUNTER — Telehealth: Payer: Self-pay

## 2020-11-03 NOTE — Telephone Encounter (Signed)
LVMT requesting call back reference next [place] class starting 11/15/20 615p-730p T/TH. Requesting to schedule initial intake.  Awaiting fax back from Dr Sharlet Salina

## 2020-11-09 ENCOUNTER — Telehealth: Payer: Self-pay | Admitting: Internal Medicine

## 2020-11-09 ENCOUNTER — Telehealth: Payer: Self-pay

## 2020-11-09 NOTE — Telephone Encounter (Signed)
Pam w/ [facility] called and is requesting a call back in regards to referral for the patient. She said she has faxed the form over several times. Please advise

## 2020-11-09 NOTE — Telephone Encounter (Signed)
Form was placed in Crawford's box to review and sign.

## 2020-11-09 NOTE — Telephone Encounter (Signed)
Left vmt pt requesting call back Haven't gotten referral back from Dr Charlynne Cousins office yet.Suggested she call MD office for referral.  Need to schedule intake for class starting on 11/15/20

## 2020-11-11 ENCOUNTER — Telehealth: Payer: Self-pay

## 2020-11-11 NOTE — Telephone Encounter (Signed)
Call to patient reference PREP intake over phone scheduled for today. Left message requesting call back. Awaiting referral from MD for class start 11/15/20 at [facility] at 615p-730p

## 2020-11-11 NOTE — Progress Notes (Signed)
[facility] PREP Progress Report   Patient Details  Name: Lori Fleming MRN: [id number] Date of Birth: 08-Jan-1971 Age: 50 y.o. PCP: Hoyt Koch, MD  There were no vitals filed for this visit.    [facility] Eval - 11/11/20 1400      Referral    Referring Provider self   awaiting MD referral   Reason for referral Diabetes;Inactivity;Hypertension    Program Start Date 11/15/20   11/15/20 T/TH 615p-730p x 12 wks     Information for Trainer   Goals Take control of health, exercise 4 x wk for 30-45 min    Current Exercise walking    Orthopedic Concerns none    Pertinent Medical History DM, HTN, diabetic retinopathy      Mobility and Daily Activities   I find it easy to walk up or down two or more flights of stairs. 2    I have no trouble taking out the trash. 3    I do housework such as vacuuming and dusting on my own without difficulty. 4    I can easily lift a gallon of milk (8lbs). 4    I can easily walk a mile. 3    I have no trouble reaching into high cupboards or reaching down to pick up something from the floor. 3    I do not have trouble doing out-door work such as Armed forces logistics/support/administrative officer, raking leaves, or gardening. 3      Mobility and Daily Activities   I feel younger than my age. 2    I feel independent. 4    I feel energetic. 2    I live an active life.  1    I feel strong. 2    I feel healthy. 1    I feel active as other people my age. 1      How fit and strong are you.   Fit and Strong Total Score 35          Past Medical History:  Diagnosis Date  . Diabetes mellitus without complication ([place])   . Hypertension    No past surgical history on file. Social History   Tobacco Use  Smoking Status Never Smoker  Smokeless Tobacco Never Used  Will do VS at start of program.     Barnett Hatter 11/11/2020, 2:45 PM

## 2020-11-16 NOTE — Telephone Encounter (Signed)
Form is still in [facility]'s box to review and sign. Once a decision has been made I will update accordingly.

## 2020-11-16 NOTE — Telephone Encounter (Signed)
Follow up message  [facility] calling for status of form

## 2020-11-17 ENCOUNTER — Ambulatory Visit (INDEPENDENT_AMBULATORY_CARE_PROVIDER_SITE_OTHER): Payer: BC Managed Care – PPO | Admitting: Internal Medicine

## 2020-11-17 ENCOUNTER — Encounter: Payer: Self-pay | Admitting: Internal Medicine

## 2020-11-17 ENCOUNTER — Other Ambulatory Visit: Payer: Self-pay

## 2020-11-17 VITALS — BP 140/90 | HR 96 | Temp 98.1°F | Resp 18 | Ht 66.0 in | Wt 171.5 lb

## 2020-11-17 DIAGNOSIS — E1165 Type 2 diabetes mellitus with hyperglycemia: Secondary | ICD-10-CM

## 2020-11-17 DIAGNOSIS — E785 Hyperlipidemia, unspecified: Secondary | ICD-10-CM

## 2020-11-17 DIAGNOSIS — E1169 Type 2 diabetes mellitus with other specified complication: Secondary | ICD-10-CM

## 2020-11-17 DIAGNOSIS — I10 Essential (primary) hypertension: Secondary | ICD-10-CM | POA: Diagnosis not present

## 2020-11-17 DIAGNOSIS — Z1159 Encounter for screening for other viral diseases: Secondary | ICD-10-CM | POA: Diagnosis not present

## 2020-11-17 DIAGNOSIS — E113399 Type 2 diabetes mellitus with moderate nonproliferative diabetic retinopathy without macular edema, unspecified eye: Secondary | ICD-10-CM | POA: Diagnosis not present

## 2020-11-17 DIAGNOSIS — IMO0002 Reserved for concepts with insufficient information to code with codable children: Secondary | ICD-10-CM

## 2020-11-17 NOTE — Patient Instructions (Signed)
We will check the labs today and let you know about the results.    

## 2020-11-17 NOTE — Progress Notes (Signed)
° °  Subjective:   Patient ID: Lori Fleming, female    DOB: 12-07-1970, 50 y.o.   MRN: [id number]  HPI The patient is a 50 YO female coming in for follow up blood pressure (started taking medications again losartan/hctz, she is taking daily without side effects, not having any more vision changes, no consistent headaches and no chest pains), and diabetes (she has resumed metformin, has not started jardiance yet and plans to do so next week, she did not want to take too many medications at once), and hyperlipidemia (did not start statin yet, plans to do some in next month due to not wanting to start too many medications at once).   Review of Systems  Constitutional: Negative.   HENT: Negative.   Eyes: Negative.   Respiratory: Negative for cough, chest tightness and shortness of breath.   Cardiovascular: Negative for chest pain, palpitations and leg swelling.  Gastrointestinal: Negative for abdominal distention, abdominal pain, constipation, diarrhea, nausea and vomiting.  Musculoskeletal: Negative.   Skin: Negative.   Neurological: Negative.   Psychiatric/Behavioral: Negative.     Objective:  Physical Exam Constitutional:      Appearance: She is well-developed and well-nourished.  HENT:     Head: Normocephalic and atraumatic.  Eyes:     Extraocular Movements: EOM normal.  Cardiovascular:     Rate and Rhythm: Normal rate and regular rhythm.  Pulmonary:     Effort: Pulmonary effort is normal. No respiratory distress.     Breath sounds: Normal breath sounds. No wheezing or rales.  Abdominal:     General: Bowel sounds are normal. There is no distension.     Palpations: Abdomen is soft.     Tenderness: There is no abdominal tenderness. There is no rebound.  Musculoskeletal:        General: No edema.     Cervical back: Normal range of motion.  Skin:    General: Skin is warm and dry.  Neurological:     Mental Status: She is alert and oriented to person, place, and time.      Coordination: Coordination normal.  Psychiatric:        Mood and Affect: Mood and affect normal.     Vitals:   11/17/20 1601  BP: 140/90  Pulse: 96  Resp: 18  Temp: 98.1 F (36.7 C)  TempSrc: Oral  SpO2: 97%  Weight: 171 lb 8 oz (77.8 kg)  Height: '5\' 6"'$  (1.676 m)    This visit occurred during the SARS-CoV-2 public health emergency.  Safety protocols were in place, including screening questions prior to the visit, additional usage of staff PPE, and extensive cleaning of exam room while observing appropriate contact time as indicated for disinfecting solutions.   Assessment & Plan:

## 2020-11-18 ENCOUNTER — Other Ambulatory Visit: Payer: Self-pay | Admitting: Internal Medicine

## 2020-11-18 ENCOUNTER — Encounter: Payer: Self-pay | Admitting: Internal Medicine

## 2020-11-18 LAB — COMPREHENSIVE METABOLIC PANEL
ALT: 11 U/L (ref 0–35)
AST: 15 U/L (ref 0–37)
Albumin: 3.8 g/dL (ref 3.5–5.2)
Alkaline Phosphatase: 50 U/L (ref 39–117)
BUN: 19 mg/dL (ref 6–23)
CO2: 24 mEq/L (ref 19–32)
Calcium: 9.1 mg/dL (ref 8.4–10.5)
Chloride: 102 mEq/L (ref 96–112)
Creatinine, Ser: 1.38 mg/dL — ABNORMAL HIGH (ref 0.40–1.20)
GFR: 45.04 mL/min — ABNORMAL LOW (ref >60.00)
Glucose, Bld: 107 mg/dL — ABNORMAL HIGH (ref 70–99)
Potassium: 4.2 mEq/L (ref 3.5–5.1)
Sodium: 136 mEq/L (ref 135–145)
Total Bilirubin: 0.3 mg/dL (ref 0.2–1.2)
Total Protein: 7.1 g/dL (ref 6.0–8.3)

## 2020-11-18 LAB — HEPATITIS C ANTIBODY
Hepatitis C Ab: NONREACTIVE
SIGNAL TO CUT-OFF: 0.01 (ref <1.00)

## 2020-11-18 LAB — VITAMIN B12: Vitamin B-12: 1173 pg/mL — ABNORMAL HIGH (ref 211–911)

## 2020-11-18 LAB — VITAMIN D 25 HYDROXY (VIT D DEFICIENCY, FRACTURES): VITD: 12.87 ng/mL — ABNORMAL LOW (ref 30.00–100.00)

## 2020-11-18 MED ORDER — VITAMIN D (ERGOCALCIFEROL) 1.25 MG (50000 UNIT) PO CAPS: 50000.0000 [IU] | ORAL_CAPSULE | ORAL | 0 refills | Status: DC

## 2020-11-18 NOTE — Assessment & Plan Note (Signed)
Has not started statin simvastatin back yet and reminded her that this is very important to lower her risk for heart attack and stroke to take long term and okay to wait another couple of weeks to start back.

## 2020-11-18 NOTE — Assessment & Plan Note (Signed)
Is taking metformin 750 mg 2 daily. She has picked up jardiance and has not started yet but plans to do so next week. Should follow up early May for HgA1c and adjustment if needed. Not taking her statin yet but also plans to do so. On ARB.

## 2020-11-18 NOTE — Assessment & Plan Note (Signed)
BP much improved with losartan/hctz and close to goal. Checking CMP and adjust as needed. Follow up 2-3 months and adjust as needed then.

## 2020-11-22 NOTE — Telephone Encounter (Signed)
[facility] follow up on if this is okay

## 2020-11-22 NOTE — Telephone Encounter (Signed)
Paperwork has been faxed to [facility]

## 2020-11-23 NOTE — Progress Notes (Signed)
[facility] PREP Weekly Session   Patient Details  Name: Lori Fleming MRN: [id number] Date of Birth: [birth year] Age: 50 y.o. PCP: Hoyt Koch, MD  There were no vitals filed for this visit.   [facility] Weekly seesion - 11/23/20 1400      Weekly Session   Topic Discussed Importance of resistance training;Other ways to be active    Minutes exercised this week 165 minutes    Classes attended to date [place] 11/23/2020, 3:00 PM

## 2020-11-30 NOTE — Progress Notes (Signed)
[facility] PREP Progress Report   Patient Details  Name: Zoeylynn Celestine MRN: [id number] Date of Birth: 01/22/1971 Age: 50 y.o. PCP: Hoyt Koch, MD  Vitals:   11/15/20 1900  BP: (!) 118/58  Pulse: 93  SpO2: 99%  Weight: 162 lb 3.2 oz (73.6 kg)  Height: '5\' 6"'$  (1.676 m)      [facility] Eval - 11/30/20 1600      Measurement   Waist Circumference 33 inches    Hip Circumference 36 inches    Body fat 36.8 percent          Past Medical History:  Diagnosis Date  . Diabetes mellitus without complication ([place])   . Hypertension    No past surgical history on file. Social History   Tobacco Use  Smoking Status Never Smoker  Smokeless Tobacco Never Used    Adding measurements   Barnett Hatter 11/30/2020, 4:33 PM

## 2020-11-30 NOTE — Progress Notes (Signed)
[facility] PREP Weekly Session   Patient Details  Name: Lori Fleming MRN: [id number] Date of Birth: Jul 28, 1971 Age: 50 y.o. PCP: Hoyt Koch, MD  Vitals:   11/29/20 1815  Weight: 162 lb (73.5 kg)     [facility] Weekly seesion - 11/30/20 0900      Weekly Session   Topic Discussed Healthy eating tips    Minutes exercised this week 165 minutes    Classes attended to date [place] 11/30/2020, 9:30 AM

## 2020-12-07 NOTE — Progress Notes (Signed)
[facility] PREP Weekly Session   Patient Details  Name: Lori Fleming MRN: [id number] Date of Birth: 02/02/1971 Age: 50 y.o. PCP: Hoyt Koch, MD  Vitals:   12/06/20 1830  Weight: 159 lb (72.1 kg)     [facility] Weekly seesion - 12/07/20 1200      Weekly Session   Topic Discussed Health habits    Minutes exercised this week 225 minutes    Classes attended to date [place] 12/07/2020, 12:08 PM

## 2020-12-14 NOTE — Progress Notes (Signed)
[facility] PREP Weekly Session   Patient Details  Name: Lori Fleming MRN: [id number] Date of Birth: 11-25-1970 Age: 50 y.o. PCP: Hoyt Koch, MD  Vitals:   12/13/20 1815  Weight: 156 lb (70.8 kg)     [facility] Weekly seesion - 12/14/20 1200      Weekly Session   Topic Discussed Restaurant Eating   Salt discussion/demo   Minutes exercised this week 220 minutes    Classes attended to date [place] 12/14/2020, 12:30 PM

## 2020-12-21 NOTE — Progress Notes (Signed)
[facility] PREP Weekly Session   Patient Details  Name: Lori Fleming MRN: [id number] Date of Birth: [birth year] Age: 50 y.o. PCP: Hoyt Koch, MD  Vitals:   12/20/20 1830  Weight: 158 lb (71.7 kg)     [facility] Weekly seesion - 12/21/20 1100      Weekly Session   Topic Discussed Stress management and problem solving   Breathwork and relaxation meditation   Minutes exercised this week 200 minutes    Classes attended to date [place] 12/21/2020, 11:47 AM

## 2020-12-28 NOTE — Progress Notes (Signed)
[facility] PREP Weekly Session   Patient Details  Name: Lori Fleming MRN: [id number] Date of Birth: Jul 15, 1971 Age: 50 y.o. PCP: Hoyt Koch, MD  Vitals:   12/27/20 1830  Weight: 156 lb 12.8 oz (71.1 kg)     [facility] Weekly seesion - 12/28/20 0900      Weekly Session   Topic Discussed Expectations and non-scale victories    Minutes exercised this week 175 minutes    Classes attended to date [place] 12/28/2020, 9:18 AM

## 2021-01-04 NOTE — Progress Notes (Signed)
[facility] PREP Weekly Session   Patient Details  Name: Lori Fleming MRN: [id number] Date of Birth: 01-19-1971 Age: 50 y.o. PCP: Hoyt Koch, MD  Vitals:   01/03/21 1830  Weight: 154 lb 3.2 oz (69.9 kg)     [facility] Weekly seesion - 01/04/21 1100      Weekly Session   Topic Discussed Other   Food Portions   Minutes exercised this week 175 minutes    Classes attended to date [place] 01/04/2021, 12:00 PM

## 2021-01-11 NOTE — Progress Notes (Signed)
[facility] PREP Weekly Session   Patient Details  Name: Lori Fleming MRN: [id number] Date of Birth: 1971-06-16 Age: 50 y.o. PCP: Hoyt Koch, MD  Vitals:   01/10/21 1830  Weight: 153 lb 9.6 oz (69.7 kg)     [facility] Weekly seesion - 01/11/21 1300      Weekly Session   Topic Discussed Finding support    Minutes exercised this week 215 minutes    Classes attended to date 17            [place] 01/11/2021, 1:03 PM

## 2021-01-18 NOTE — Progress Notes (Signed)
[facility] PREP Weekly Session   Patient Details  Name: Lori Fleming MRN: [id number] Date of Birth: 25-Dec-1970 Age: 50 y.o. PCP: Hoyt Koch, MD  Vitals:   01/17/21 1815  Weight: 157 lb (71.2 kg)     [facility] Weekly seesion - 01/18/21 1100      Weekly Session   Topic Discussed Calorie breakdown    Minutes exercised this week 200 minutes    Classes attended to date [place] 01/18/2021, 11:37 AM

## 2021-01-25 NOTE — Progress Notes (Signed)
[facility] PREP Weekly Session   Patient Details  Name: Lori Fleming MRN: [id number] Date of Birth: 1971-01-11 Age: 50 y.o. PCP: Hoyt Koch, MD  Vitals:   01/24/21 1830  Weight: 155 lb 6.4 oz (70.5 kg)     [facility] Weekly seesion - 01/25/21 1000      Weekly Session   Topic Discussed Hitting roadblocks   completed how fit and strong survery, to comple goals for next 90 days   Minutes exercised this week 170 minutes    Classes attended to date [place] 01/25/2021, 10:09 AM

## 2021-01-31 ENCOUNTER — Other Ambulatory Visit: Payer: Self-pay | Admitting: Internal Medicine

## 2021-02-01 NOTE — Progress Notes (Signed)
[facility] PREP Weekly Session   Patient Details  Name: Lori Fleming MRN: [id number] Date of Birth: 28-Dec-1970 Age: 50 y.o. PCP: Hoyt Koch, MD  Vitals:   01/31/21 1830  Weight: 155 lb (70.3 kg)     [facility] Weekly seesion - 02/01/21 0900      Weekly Session   Topic Discussed --   Final Fit Testing for PREP   Minutes exercised this week 313 minutes    Classes attended to date 24          PREP class 01/31/21 Doubled her march test, Improvements in strength and endurance ntoed Encouraged to continue exercises to improve balance   Barnett Hatter 02/01/2021, 9:51 AM

## 2021-02-03 NOTE — Progress Notes (Signed)
[facility] PREP Progress Report   Patient Details  Name: Lori Fleming MRN: [id number] Date of Birth: 1971/03/28 Age: 50 y.o. PCP: Hoyt Koch, MD  Vitals:   02/02/21 1800  BP: 130/82  Pulse: 85  SpO2: 98%  Weight: 155 lb (70.3 kg)      [facility] Eval - 02/03/21 1100      Measurement   Waist Circumference 33 inches    Hip Circumference 36.5 inches    Body fat 34.2 percent      Mobility and Daily Activities   I find it easy to walk up or down two or more flights of stairs. 3    I have no trouble taking out the trash. 1    I do housework such as vacuuming and dusting on my own without difficulty. 4    I can easily lift a gallon of milk (8lbs). 4    I can easily walk a mile. 4    I have no trouble reaching into high cupboards or reaching down to pick up something from the floor. 1    I do not have trouble doing out-door work such as Armed forces logistics/support/administrative officer, raking leaves, or gardening. 4      Mobility and Daily Activities   I feel younger than my age. 2    I feel independent. 4    I feel energetic. 3    I live an active life.  3    I feel strong. 3    I feel healthy. 2    I feel active as other people my age. 2      How fit and strong are you.   Fit and Strong Total Score 40          Past Medical History:  Diagnosis Date  . Diabetes mellitus without complication ([place])   . Hypertension    No past surgical history on file. Social History   Tobacco Use  Smoking Status Never Smoker  Smokeless Tobacco Never Used        Libbi Towner B Theo Reither 02/03/2021, 11:18 AM

## 2021-02-14 ENCOUNTER — Ambulatory Visit: Payer: BC Managed Care – PPO | Admitting: Internal Medicine

## 2021-03-29 DIAGNOSIS — H35373 Puckering of macula, bilateral: Secondary | ICD-10-CM | POA: Diagnosis not present

## 2021-03-29 DIAGNOSIS — E113593 Type 2 diabetes mellitus with proliferative diabetic retinopathy without macular edema, bilateral: Secondary | ICD-10-CM | POA: Diagnosis not present

## 2021-03-29 LAB — HM DIABETES EYE EXAM

## 2021-03-31 ENCOUNTER — Encounter: Payer: Self-pay | Admitting: Internal Medicine

## 2021-03-31 ENCOUNTER — Ambulatory Visit (INDEPENDENT_AMBULATORY_CARE_PROVIDER_SITE_OTHER): Payer: BC Managed Care – PPO | Admitting: Internal Medicine

## 2021-03-31 ENCOUNTER — Other Ambulatory Visit: Payer: Self-pay

## 2021-03-31 VITALS — BP 132/76 | HR 93 | Temp 98.7°F | Resp 18 | Ht 66.0 in | Wt 158.2 lb

## 2021-03-31 DIAGNOSIS — I10 Essential (primary) hypertension: Secondary | ICD-10-CM | POA: Diagnosis not present

## 2021-03-31 DIAGNOSIS — E1165 Type 2 diabetes mellitus with hyperglycemia: Secondary | ICD-10-CM | POA: Diagnosis not present

## 2021-03-31 DIAGNOSIS — E1169 Type 2 diabetes mellitus with other specified complication: Secondary | ICD-10-CM

## 2021-03-31 DIAGNOSIS — E559 Vitamin D deficiency, unspecified: Secondary | ICD-10-CM | POA: Diagnosis not present

## 2021-03-31 DIAGNOSIS — E113399 Type 2 diabetes mellitus with moderate nonproliferative diabetic retinopathy without macular edema, unspecified eye: Secondary | ICD-10-CM

## 2021-03-31 DIAGNOSIS — IMO0002 Reserved for concepts with insufficient information to code with codable children: Secondary | ICD-10-CM

## 2021-03-31 DIAGNOSIS — E785 Hyperlipidemia, unspecified: Secondary | ICD-10-CM

## 2021-03-31 LAB — LIPID PANEL
Cholesterol: 213 mg/dL — ABNORMAL HIGH (ref 0–200)
HDL: 45.7 mg/dL (ref >39.00)
LDL Cholesterol: 133 mg/dL — ABNORMAL HIGH (ref 0–99)
NonHDL: 167.05
Total CHOL/HDL Ratio: 5
Triglycerides: 168 mg/dL — ABNORMAL HIGH (ref 0.0–149.0)
VLDL: 33.6 mg/dL (ref 0.0–40.0)

## 2021-03-31 LAB — VITAMIN D 25 HYDROXY (VIT D DEFICIENCY, FRACTURES): VITD: 39.59 ng/mL (ref 30.00–100.00)

## 2021-03-31 LAB — TSH: TSH: 1.29 u[IU]/mL (ref 0.35–5.50)

## 2021-03-31 LAB — HEMOGLOBIN A1C: Hgb A1c MFr Bld: 7.7 % — ABNORMAL HIGH (ref 4.6–6.5)

## 2021-03-31 NOTE — Assessment & Plan Note (Signed)
BP at goal on losartan/hctz and CMP at last visit without indication for change. Given her diabetes good control is essential to maintain renal health.

## 2021-03-31 NOTE — Progress Notes (Signed)
   Subjective:   Patient ID: Lori Fleming, female    DOB: 18-Oct-1970, 50 y.o.   MRN: [id number]  HPI The patient is a 50 YO female coming in for follow up cholesterol (did not start cholesterol medication but she has made dietary changes), and diabetes (did not start jardiance but is back on metformin since visit January, denies numbness or tingling in hands/feet), and blood pressure (taking losartan/hctz and denies side effects).   Review of Systems  Constitutional: Negative.   HENT: Negative.    Eyes: Negative.   Respiratory:  Negative for cough, chest tightness and shortness of breath.   Cardiovascular:  Negative for chest pain, palpitations and leg swelling.  Gastrointestinal:  Negative for abdominal distention, abdominal pain, constipation, diarrhea, nausea and vomiting.  Musculoskeletal: Negative.   Skin: Negative.   Neurological: Negative.   Psychiatric/Behavioral: Negative.     Objective:  Physical Exam Constitutional:      Appearance: She is well-developed.  HENT:     Head: Normocephalic and atraumatic.  Cardiovascular:     Rate and Rhythm: Normal rate and regular rhythm.  Pulmonary:     Effort: Pulmonary effort is normal. No respiratory distress.     Breath sounds: Normal breath sounds. No wheezing or rales.  Abdominal:     General: Bowel sounds are normal. There is no distension.     Palpations: Abdomen is soft.     Tenderness: There is no abdominal tenderness. There is no rebound.  Musculoskeletal:     Cervical back: Normal range of motion.  Skin:    General: Skin is warm and dry.  Neurological:     Mental Status: She is alert and oriented to person, place, and time.     Coordination: Coordination normal.    Vitals:   03/31/21 1422  BP: 132/76  Pulse: 93  Resp: 18  Temp: 98.7 F (37.1 C)  TempSrc: Oral  SpO2: 99%  Weight: 158 lb 3.2 oz (71.8 kg)  Height: '5\' 6"'$  (1.676 m)    This visit occurred during the SARS-CoV-2 public health  emergency.  Safety protocols were in place, including screening questions prior to the visit, additional usage of staff PPE, and extensive cleaning of exam room while observing appropriate contact time as indicated for disinfecting solutions.   Assessment & Plan:

## 2021-03-31 NOTE — Patient Instructions (Signed)
We will check the blood work today and let you know.

## 2021-03-31 NOTE — Assessment & Plan Note (Signed)
Did not start simvastatin as prescribed. Explained the importance of this to reduce risk for heart attack and stroke. She would like to check lipid panel and may start statin if cholesterol still high.

## 2021-03-31 NOTE — Assessment & Plan Note (Signed)
She did not start jardiance. She is taking metformin 750 mg XR BID. She is not on statin as she did not start this. She is on ARB. Adjust as needed.

## 2021-04-01 LAB — C-PEPTIDE: C-Peptide: 2.16 ng/mL (ref 0.80–3.85)

## 2021-04-05 ENCOUNTER — Other Ambulatory Visit: Payer: Self-pay | Admitting: Internal Medicine

## 2021-04-17 ENCOUNTER — Other Ambulatory Visit: Payer: Self-pay | Admitting: Internal Medicine

## 2021-04-27 ENCOUNTER — Other Ambulatory Visit: Payer: Self-pay | Admitting: Internal Medicine

## 2021-04-28 ENCOUNTER — Other Ambulatory Visit: Payer: Self-pay | Admitting: Internal Medicine

## 2021-05-18 ENCOUNTER — Encounter: Payer: Self-pay | Admitting: *Deleted

## 2021-05-18 ENCOUNTER — Emergency Department (HOSPITAL_COMMUNITY)
Admission: EM | Admit: 2021-05-18 | Discharge: 2021-05-18 | Disposition: A | Discharge status: Home / Self Care | Payer: BC Managed Care – PPO | Attending: Emergency Medicine | Admitting: Emergency Medicine

## 2021-05-18 ENCOUNTER — Ambulatory Visit
Admission: EM | Admit: 2021-05-18 | Discharge: 2021-05-18 | Disposition: A | Discharge status: Other Acute Inpatient Hospital | Payer: BC Managed Care – PPO

## 2021-05-18 ENCOUNTER — Encounter (HOSPITAL_COMMUNITY): Payer: Self-pay

## 2021-05-18 ENCOUNTER — Other Ambulatory Visit: Payer: Self-pay

## 2021-05-18 DIAGNOSIS — I10 Essential (primary) hypertension: Secondary | ICD-10-CM | POA: Diagnosis not present

## 2021-05-18 DIAGNOSIS — E785 Hyperlipidemia, unspecified: Secondary | ICD-10-CM | POA: Diagnosis not present

## 2021-05-18 DIAGNOSIS — Z79899 Other long term (current) drug therapy: Secondary | ICD-10-CM | POA: Insufficient documentation

## 2021-05-18 DIAGNOSIS — Z7984 Long term (current) use of oral hypoglycemic drugs: Secondary | ICD-10-CM | POA: Insufficient documentation

## 2021-05-18 DIAGNOSIS — E1169 Type 2 diabetes mellitus with other specified complication: Secondary | ICD-10-CM | POA: Insufficient documentation

## 2021-05-18 LAB — CBC WITH DIFFERENTIAL/PLATELET
Abs Immature Granulocytes: 0.02 10*3/uL (ref 0.00–0.07)
Basophils Absolute: 0.1 10*3/uL (ref 0.0–0.1)
Basophils Relative: 1 %
Eosinophils Absolute: 0.2 10*3/uL (ref 0.0–0.5)
Eosinophils Relative: 4 %
HCT: 30.2 % — ABNORMAL LOW (ref 36.0–46.0)
Hemoglobin: 9.8 g/dL — ABNORMAL LOW (ref 12.0–15.0)
Immature Granulocytes: 0 %
Lymphocytes Relative: 28 %
Lymphs Abs: 1.8 10*3/uL (ref 0.7–4.0)
MCH: 28.7 pg (ref 26.0–34.0)
MCHC: 32.5 g/dL (ref 30.0–36.0)
MCV: 88.6 fL (ref 80.0–100.0)
Monocytes Absolute: 0.3 10*3/uL (ref 0.1–1.0)
Monocytes Relative: 5 %
Neutro Abs: 3.8 10*3/uL (ref 1.7–7.7)
Neutrophils Relative %: 62 %
Platelets: 482 10*3/uL — ABNORMAL HIGH (ref 150–400)
RBC: 3.41 MIL/uL — ABNORMAL LOW (ref 3.87–5.11)
RDW: 13.4 % (ref 11.5–15.5)
WBC: 6.2 10*3/uL (ref 4.0–10.5)
nRBC: 0 % (ref 0.0–0.2)

## 2021-05-18 LAB — COMPREHENSIVE METABOLIC PANEL
ALT: 9 U/L (ref 0–44)
AST: 11 U/L — ABNORMAL LOW (ref 15–41)
Albumin: 3.7 g/dL (ref 3.5–5.0)
Alkaline Phosphatase: 59 U/L (ref 38–126)
Anion gap: 9 (ref 5–15)
BUN: 22 mg/dL — ABNORMAL HIGH (ref 6–20)
CO2: 24 mmol/L (ref 22–32)
Calcium: 9.2 mg/dL (ref 8.9–10.3)
Chloride: 104 mmol/L (ref 98–111)
Creatinine, Ser: 1.23 mg/dL — ABNORMAL HIGH (ref 0.44–1.00)
GFR, Estimated: 54 mL/min — ABNORMAL LOW (ref >60)
Glucose, Bld: 130 mg/dL — ABNORMAL HIGH (ref 70–99)
Potassium: 4 mmol/L (ref 3.5–5.1)
Sodium: 137 mmol/L (ref 135–145)
Total Bilirubin: 0.4 mg/dL (ref 0.3–1.2)
Total Protein: 7.4 g/dL (ref 6.5–8.1)

## 2021-05-18 LAB — HCG, SERUM, QUALITATIVE: Preg, Serum: NEGATIVE

## 2021-05-18 MED ORDER — LABETALOL HCL 5 MG/ML IV SOLN
5.0000 mg | Freq: Once | INTRAVENOUS | Status: AC
  Administered 2021-05-18: 5 mg via INTRAVENOUS
  Filled 2021-05-18: qty 4

## 2021-05-18 MED ORDER — AMLODIPINE BESYLATE 5 MG PO TABS: 5.0000 mg | ORAL_TABLET | Freq: Every day | ORAL | 0 refills | Status: DC

## 2021-05-18 MED ORDER — ACETAMINOPHEN 500 MG PO TABS
1000.0000 mg | ORAL_TABLET | Freq: Once | ORAL | Status: AC
  Administered 2021-05-18: 1000 mg via ORAL
  Filled 2021-05-18: qty 2
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
## 2021-05-18 NOTE — ED Provider Notes (Signed)
Emergency Medicine Provider Triage Evaluation Note  Lori Fleming , a 50 y.o. female  was evaluated in triage.  Pt complains of headache.  States her symptoms started 2 days ago.  Gradual onset and intermittent.  States it started along the right temporal region and has migrated to the frontal region.  Denies any visual changes, numbness, weakness, chest pain, shortness of breath.  States that she went to urgent care earlier today and was noted to have an elevated blood pressure and was sent to the emergency department for further evaluation.  She states that she takes losartan for her hypertension and believes that she has been compliant with this medication but also notes that she might of missed a dose at some point this week.    Physical Exam  BP (!) 225/108 (BP Location: Right Arm) Comment: charge nurse notified  Pulse (!) 104   Temp 98.1 F (36.7 C) (Oral)   Resp 18   Ht '5\' 6"'$  (1.676 m)   Wt 70.7 kg   LMP 05/04/2021 (Approximate)   SpO2 98%   BMI 25.15 kg/m  Gen:   Awake, no distress   Resp:  Normal effort  MSK:   Moves extremities without difficulty  Other:    Medical Decision Making  Medically screening exam initiated at 11:58 AM.  Appropriate orders placed.  Lori Fleming was informed that the remainder of the evaluation will be completed by another provider, this initial triage assessment does not replace that evaluation, and the importance of remaining in the ED until their evaluation is complete.   Lori Sexton, PA-C 05/18/21 1159    [name], MD 05/19/21 [phone]

## 2021-05-18 NOTE — Discharge Instructions (Addendum)
Take amlodipine as directed. Continue taking your regular blood pressure medications  Keep a log of your blood pressures at home  I have given you a referral to a heart doctor. Please call the office to schedule an appointment for follow up  Please follow up with your primary care provider within 5-7 days for re-evaluation of your symptoms. If you do not have a primary care provider, information for a healthcare clinic has been provided for you to make arrangements for follow up care. Please return to the emergency department for any new or worsening symptoms.
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
## 2021-06-17 ENCOUNTER — Ambulatory Visit
Admission: EM | Admit: 2021-06-17 | Discharge: 2021-06-17 | Disposition: A | Discharge status: Other Acute Inpatient Hospital | Payer: BC Managed Care – PPO

## 2021-06-17 ENCOUNTER — Encounter: Payer: Self-pay | Admitting: Emergency Medicine

## 2021-06-17 ENCOUNTER — Inpatient Hospital Stay (HOSPITAL_COMMUNITY)
Admission: EM | Admit: 2021-06-17 | Discharge: 2021-06-20 | DRG: 065 | Disposition: A | Discharge status: Home / Self Care | Payer: BC Managed Care – PPO | Attending: Internal Medicine | Admitting: Internal Medicine

## 2021-06-17 ENCOUNTER — Other Ambulatory Visit: Payer: Self-pay

## 2021-06-17 ENCOUNTER — Emergency Department (HOSPITAL_COMMUNITY): Payer: BC Managed Care – PPO

## 2021-06-17 ENCOUNTER — Encounter (HOSPITAL_COMMUNITY): Payer: Self-pay

## 2021-06-17 DIAGNOSIS — R29702 NIHSS score 2: Secondary | ICD-10-CM | POA: Diagnosis not present

## 2021-06-17 DIAGNOSIS — R270 Ataxia, unspecified: Secondary | ICD-10-CM | POA: Diagnosis not present

## 2021-06-17 DIAGNOSIS — G459 Transient cerebral ischemic attack, unspecified: Secondary | ICD-10-CM | POA: Diagnosis present

## 2021-06-17 DIAGNOSIS — D649 Anemia, unspecified: Secondary | ICD-10-CM | POA: Diagnosis not present

## 2021-06-17 DIAGNOSIS — Z7984 Long term (current) use of oral hypoglycemic drugs: Secondary | ICD-10-CM | POA: Diagnosis not present

## 2021-06-17 DIAGNOSIS — R471 Dysarthria and anarthria: Secondary | ICD-10-CM | POA: Diagnosis not present

## 2021-06-17 DIAGNOSIS — I6523 Occlusion and stenosis of bilateral carotid arteries: Secondary | ICD-10-CM | POA: Diagnosis not present

## 2021-06-17 DIAGNOSIS — Y92003 Bedroom of unspecified non-institutional (private) residence as the place of occurrence of the external cause: Secondary | ICD-10-CM | POA: Diagnosis not present

## 2021-06-17 DIAGNOSIS — W06XXXA Fall from bed, initial encounter: Secondary | ICD-10-CM | POA: Diagnosis present

## 2021-06-17 DIAGNOSIS — D75839 Thrombocytosis, unspecified: Secondary | ICD-10-CM

## 2021-06-17 DIAGNOSIS — I639 Cerebral infarction, unspecified: Secondary | ICD-10-CM | POA: Diagnosis not present

## 2021-06-17 DIAGNOSIS — G8191 Hemiplegia, unspecified affecting right dominant side: Secondary | ICD-10-CM | POA: Diagnosis present

## 2021-06-17 DIAGNOSIS — Z79899 Other long term (current) drug therapy: Secondary | ICD-10-CM

## 2021-06-17 DIAGNOSIS — N179 Acute kidney failure, unspecified: Secondary | ICD-10-CM | POA: Diagnosis present

## 2021-06-17 DIAGNOSIS — Z8249 Family history of ischemic heart disease and other diseases of the circulatory system: Secondary | ICD-10-CM | POA: Diagnosis not present

## 2021-06-17 DIAGNOSIS — I10 Essential (primary) hypertension: Secondary | ICD-10-CM | POA: Diagnosis not present

## 2021-06-17 DIAGNOSIS — E1165 Type 2 diabetes mellitus with hyperglycemia: Secondary | ICD-10-CM

## 2021-06-17 DIAGNOSIS — R4781 Slurred speech: Secondary | ICD-10-CM | POA: Diagnosis not present

## 2021-06-17 DIAGNOSIS — R29818 Other symptoms and signs involving the nervous system: Secondary | ICD-10-CM | POA: Diagnosis not present

## 2021-06-17 DIAGNOSIS — E785 Hyperlipidemia, unspecified: Secondary | ICD-10-CM | POA: Diagnosis present

## 2021-06-17 DIAGNOSIS — Z833 Family history of diabetes mellitus: Secondary | ICD-10-CM | POA: Diagnosis not present

## 2021-06-17 DIAGNOSIS — R29701 NIHSS score 1: Secondary | ICD-10-CM | POA: Diagnosis not present

## 2021-06-17 DIAGNOSIS — E860 Dehydration: Secondary | ICD-10-CM | POA: Diagnosis not present

## 2021-06-17 DIAGNOSIS — W19XXXA Unspecified fall, initial encounter: Secondary | ICD-10-CM

## 2021-06-17 DIAGNOSIS — Z20822 Contact with and (suspected) exposure to covid-19: Secondary | ICD-10-CM | POA: Diagnosis not present

## 2021-06-17 DIAGNOSIS — R296 Repeated falls: Secondary | ICD-10-CM | POA: Diagnosis not present

## 2021-06-17 DIAGNOSIS — R531 Weakness: Secondary | ICD-10-CM

## 2021-06-17 DIAGNOSIS — R29700 NIHSS score 0: Secondary | ICD-10-CM | POA: Diagnosis not present

## 2021-06-17 DIAGNOSIS — E042 Nontoxic multinodular goiter: Secondary | ICD-10-CM | POA: Diagnosis not present

## 2021-06-17 DIAGNOSIS — R000 Tachycardia, unspecified: Secondary | ICD-10-CM | POA: Diagnosis not present

## 2021-06-17 LAB — BASIC METABOLIC PANEL
Anion gap: 7 (ref 5–15)
BUN: 17 mg/dL (ref 6–20)
CO2: 24 mmol/L (ref 22–32)
Calcium: 8.9 mg/dL (ref 8.9–10.3)
Chloride: 105 mmol/L (ref 98–111)
Creatinine, Ser: 1.44 mg/dL — ABNORMAL HIGH (ref 0.44–1.00)
GFR, Estimated: 45 mL/min — ABNORMAL LOW (ref >60)
Glucose, Bld: 142 mg/dL — ABNORMAL HIGH (ref 70–99)
Potassium: 4.8 mmol/L (ref 3.5–5.1)
Sodium: 136 mmol/L (ref 135–145)

## 2021-06-17 LAB — CBC
HCT: 30.4 % — ABNORMAL LOW (ref 36.0–46.0)
Hemoglobin: 10.1 g/dL — ABNORMAL LOW (ref 12.0–15.0)
MCH: 29.4 pg (ref 26.0–34.0)
MCHC: 33.2 g/dL (ref 30.0–36.0)
MCV: 88.6 fL (ref 80.0–100.0)
Platelets: 472 10*3/uL — ABNORMAL HIGH (ref 150–400)
RBC: 3.43 MIL/uL — ABNORMAL LOW (ref 3.87–5.11)
RDW: 13.6 % (ref 11.5–15.5)
WBC: 6.6 10*3/uL (ref 4.0–10.5)
nRBC: 0 % (ref 0.0–0.2)

## 2021-06-17 LAB — CBG MONITORING, ED: Glucose-Capillary: 115 mg/dL — ABNORMAL HIGH (ref 70–99)

## 2021-06-17 LAB — URINALYSIS, ROUTINE W REFLEX MICROSCOPIC
Bilirubin Urine: NEGATIVE
Glucose, UA: NEGATIVE mg/dL
Ketones, ur: 5 mg/dL — AB
Leukocytes,Ua: NEGATIVE
Nitrite: NEGATIVE
Protein, ur: >=300 mg/dL — AB
Specific Gravity, Urine: 1.009 (ref 1.005–1.030)
pH: 7 (ref 5.0–8.0)

## 2021-06-17 LAB — RESP PANEL BY RT-PCR (FLU A&B, COVID) ARPGX2
Influenza A by PCR: NEGATIVE
Influenza B by PCR: NEGATIVE
SARS Coronavirus 2 by RT PCR: NEGATIVE

## 2021-06-17 LAB — HCG, QUANTITATIVE, PREGNANCY: hCG, Beta Chain, Quant, S: 1 m[IU]/mL (ref <5)

## 2021-06-17 MED ORDER — ASPIRIN EC 81 MG PO TBEC
81.0000 mg | DELAYED_RELEASE_TABLET | Freq: Every day | ORAL | Status: DC
  Administered 2021-06-18 – 2021-06-19 (×2): 81 mg via ORAL
  Filled 2021-06-17 (×3): qty 1

## 2021-06-17 NOTE — ED Notes (Signed)
Patient is being discharged from the Urgent Care and sent to the Emergency Department via EMS . Per [employer], patient is in need of higher level of care due to Stroke Like symptoms and HTN. Patient is aware and verbalizes understanding of plan of care.  Vitals:   06/17/21 1421  BP: (!) 162/96  Pulse: (!) 112  Resp: 18  Temp: 99.1 F (37.3 C)  SpO2: 98%

## 2021-06-17 NOTE — ED Provider Notes (Signed)
[facility] EMERGENCY DEPARTMENT Provider Note   CSN: [id number] Arrival date & time: 06/17/21  1503     History Chief Complaint  Patient presents with   Weakness   Dizziness    Lori Fleming is a 50 y.o. female.  Patient complains of some slurred speech and weakness in the right hand and ataxia in her right leg ever since 2 days ago.  She said her speech seems to be improved some  The history is provided by the patient and medical records. No language interpreter was used.  Weakness Severity:  Moderate Onset quality:  Sudden Timing:  Constant Progression:  Waxing and waning Chronicity:  New Context: not alcohol use   Relieved by:  Nothing Worsened by:  Nothing Ineffective treatments:  None tried Associated symptoms: dizziness   Associated symptoms: no abdominal pain, no chest pain, no cough, no diarrhea, no frequency, no headaches and no seizures   Dizziness Associated symptoms: weakness   Associated symptoms: no chest pain, no diarrhea and no headaches       Past Medical History:  Diagnosis Date   Diabetes mellitus without complication ([place])    Hypertension     Patient Active Problem List   Diagnosis Date Noted   Hyperlipidemia associated with type 2 diabetes mellitus ([place]) 10/21/2020   Routine general medical examination at a health care facility 04/07/2018   Breast cyst 04/05/2016   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 No pertinent surgical history.   OB History   No obstetric history on file.     Family History  Problem Relation Age of Onset   Hypertension Father    Diabetes Father    Hypertension Sister    Mental illness Sister    Mental illness Brother    Alcohol abuse Paternal Grandfather    Diabetes Maternal Aunt     Social History   Tobacco Use   Smoking status: Never    Smokeless tobacco: Never  Substance Use Topics   Alcohol use: No    Alcohol/week: 0.0 standard drinks   Drug use: No    Home Medications Prior to Admission medications   Medication Sig Start Date End Date Taking? Authorizing Provider  amLODipine (NORVASC) 5 MG tablet Take 1 tablet (5 mg total) by mouth daily. 05/18/21 06/17/21 Yes Couture, Cortni S, PA-C  losartan-hydrochlorothiazide (HYZAAR) 100-25 MG tablet TAKE 1 TABLET BY MOUTH EVERY DAY 04/19/21  Yes Hoyt Koch, MD  metFORMIN (GLUCOPHAGE-XR) 750 MG 24 hr tablet TAKE 1 TABLET (750 MG TOTAL) BY MOUTH 2 (TWO) TIMES DAILY WITH A MEAL. 04/28/21  Yes Hoyt Koch, MD  Multiple Vitamins-Minerals (WOMENS MULTI VITAMIN & MINERAL PO) Take by mouth.   Yes [provider]    Allergies    Patient has no known allergies.  Review of Systems   Review of Systems  Constitutional:  Negative for appetite change and fatigue.  HENT:  Negative for congestion, ear discharge and sinus pressure.   Eyes:  Negative for discharge.  Respiratory:  Negative for cough.   Cardiovascular:  Negative for chest pain.  Gastrointestinal:  Negative for abdominal pain and diarrhea.  Genitourinary:  Negative for frequency and hematuria.  Musculoskeletal:  Negative for back pain.  Skin:  Negative for rash.  Neurological:  Positive for dizziness and weakness. Negative for seizures and headaches.       Slurred speech and  weakness to right hand and right leg  Psychiatric/Behavioral:  Negative for hallucinations.    Physical Exam Updated Vital Signs BP (!) 167/92   Pulse 92   Temp 98.3 F (36.8 C)   Resp 12   Ht '5\' 6"'$  (1.676 m)   Wt 70.7 kg   LMP 06/11/2021 (Exact Date)   SpO2 99%   BMI 25.15 kg/m   Physical Exam Vitals and nursing note reviewed.  Constitutional:      Appearance: She is well-developed.  HENT:     Head: Normocephalic.     Nose: Nose normal.  Eyes:     General: No scleral icterus.    Conjunctiva/sclera:  Conjunctivae normal.  Neck:     Thyroid: No thyromegaly.  Cardiovascular:     Rate and Rhythm: Normal rate and regular rhythm.     Heart sounds: No murmur heard.   No friction rub. No gallop.  Pulmonary:     Breath sounds: No stridor. No wheezing or rales.  Chest:     Chest wall: No tenderness.  Abdominal:     General: There is no distension.     Tenderness: There is no abdominal tenderness. There is no rebound.  Musculoskeletal:     Cervical back: Neck supple.     Comments: Ataxia with right leg and coordination problems of right hand  Lymphadenopathy:     Cervical: No cervical adenopathy.  Skin:    Findings: No erythema or rash.  Neurological:     Mental Status: She is alert and oriented to person, place, and time.     Motor: No abnormal muscle tone.     Coordination: Coordination normal.  Psychiatric:        Behavior: Behavior normal.    ED Results / Procedures / Treatments   Labs (all labs ordered are listed, but only abnormal results are displayed) Labs Reviewed  BASIC METABOLIC PANEL - Abnormal; Notable for the following components:      Result Value   Glucose, Bld 142 (*)    Creatinine, Ser 1.44 (*)    GFR, Estimated 45 (*)    All other components within normal limits  CBC - Abnormal; Notable for the following components:   RBC 3.43 (*)    Hemoglobin 10.1 (*)    HCT 30.4 (*)    Platelets 472 (*)    All other components within normal limits  URINALYSIS, ROUTINE W REFLEX MICROSCOPIC - Abnormal; Notable for the following components:   APPearance HAZY (*)    Hgb urine dipstick SMALL (*)    Ketones, ur 5 (*)    Protein, ur >=300 (*)    Bacteria, UA RARE (*)    All other components within normal limits  CBG MONITORING, ED - Abnormal; Notable for the following components:   Glucose-Capillary 115 (*)    All other components within normal limits  RESP PANEL BY RT-PCR (FLU A&B, COVID) ARPGX2  HCG, QUANTITATIVE, PREGNANCY    EKG None  Radiology CT HEAD WO  CONTRAST (5MM)  Result Date: 06/17/2021 CLINICAL DATA:  Cerebral hemorrhage suspected. Slurred speech, right-sided weakness, multiple falls. EXAM: CT HEAD WITHOUT CONTRAST TECHNIQUE: Contiguous axial images were obtained from the base of the skull through the vertex without intravenous contrast. COMPARISON:  None. FINDINGS: Brain: Normal anatomic configuration. No abnormal intra or extra-axial mass lesion or fluid collection. No abnormal mass effect or midline shift. No evidence of acute intracranial hemorrhage or infarct. Ventricular size is normal. Cerebellum unremarkable. Vascular: No asymmetric hyperdense vasculature  at the skull base. Mild atherosclerotic calcification within the carotid siphons bilaterally. Skull: Intact Sinuses/Orbits: Paranasal sinuses are clear. Orbits are unremarkable. Other: Mastoid air cells and middle ear cavities are clear. IMPRESSION: No acute intracranial abnormality. Electronically Signed   By: Fidela Salisbury M.D.   On: 06/17/2021 20:16    Procedures Procedures   Medications Ordered in ED Medications - No data to display  ED Course  I have reviewed the triage vital signs and the nursing notes.  Pertinent labs & imaging results that were available during my care of the patient were reviewed by me and considered in my medical decision making (see chart for details). CRITICAL CARE Performed by: [name] Total critical care time: 49 minutes Critical care time was exclusive of separately billable procedures and treating other patients. Critical care was necessary to treat or prevent imminent or life-threatening deterioration. Critical care was time spent personally by me on the following activities: development of treatment plan with patient and/or surrogate as well as nursing, discussions with consultants, evaluation of patient's response to treatment, examination of patient, obtaining history from patient or surrogate, ordering and performing treatments and  interventions, ordering and review of laboratory studies, ordering and review of radiographic studies, pulse oximetry and re-evaluation of patient's condition.  Patient with stroke symptoms but negative CTs.  I spoke to the neurologist Dr. Theda Sers and he wants medicine to admit the patient over [facility] with neurology consulting MDM Rules/Calculators/A&P                           Patient with strokelike symptoms.  Most likely has had a stroke but not seen on CT yet.  She will be admitted to medicine with neurology consult Final Clinical Impression(s) / ED Diagnoses Final diagnoses:  Weakness    Rx / DC Orders ED Discharge Orders     None        [name], MD 06/19/21 1128

## 2021-06-17 NOTE — ED Triage Notes (Addendum)
Patient presents for fall that occurred 2 days ago.   Patient states " I was getting out of the bed when I fell backwards".   Patient denies LOC or hitting head.   Patient endorses "issues writing with RT hand".   Patient endorses balance issues.   Patient endorses changes in speech upon onset of symptoms.   Patient denies any new medications use.   Patient denies history of dizziness or balance issues.

## 2021-06-17 NOTE — H&P (Signed)
History and Physical  Angala Hanscom [id number] DOB: Jun 06, 1971 DOA: 06/17/2021  Referring physician: [name], MD PCP: Hoyt Koch, MD  Patient coming from: Home  Chief Complaint: Right hand weakness and gait problem  HPI: Trela Hemric is a 50 y.o. female with medical history significant for essential HTN and type 2 diabetes mellitus who presents to the emergency department due to 2-day onset of weakness in the right and gait difficulty.  Patient states that she fell out of her bed on Wednesday (9/21), but sustained no injury, she states that she was fine when she first woke up around 5:30 AM on Thursday (9/22), she went back to bed and on waking up at 7:45 AM, she noted that she had a slurred speech, there was weakness in the right hand with difficulty in being able to write legibly and she noted that on ambulation, she had difficulty in walking straight, but tends to always deviate towards right side on walking.  Patient states that slurred speech improved several hours after onset and completely resolved before the end of day on Thursday (9/22), weakness in right hand has improved, but has not resolved completely, she states that she continues to deviate towards right side when walking.  Patient states that she fell again this morning when she bent down to unplug her Phone, she denies hitting her head and denies loss of consciousness.  She spoke with her sister on phone, who asked to go to an urgent care, she was asked to go to the ED for further evaluation after being seen at urgent care.  She denies nausea, vomiting, chest pain, shortness of breath, fever or chills.  ED Course:  In the emergency department, BP was elevated at 27/59, but other vital signs were within normal range.  Work-up in the ED showed normocytic anemia,,, cytosis, hyperglycemia, BUN/creatinine 14/1.44 (baseline creatinine at 1.1-1.2).  Influenza A, B, SARS coronavirus 2 was negative.  CT of head  without contrast showed no acute intracranial abnormality. Neurologist at [facility] (Dr. Theda Sers) was consulted and recommended hospitalist admission to [facility] with plan to consult patient when she arrives to [facility].  Hospitalist was asked to admit patient for further evaluation and management.  Review of Systems: Constitutional: Negative for chills and fever.  HENT: Negative for ear pain and sore throat.   Eyes: Negative for pain and visual disturbance.  Respiratory: Negative for cough, chest tightness and shortness of breath.   Cardiovascular: Negative for chest pain and palpitations.  Gastrointestinal: Negative for abdominal pain and vomiting.  Endocrine: Negative for polyphagia and polyuria.  Genitourinary: Negative for decreased urine volume, dysuria, enuresis Musculoskeletal: Positive for right hand weakness and gait difficulty.   Skin: Negative for color change and rash.  Allergic/Immunologic: Negative for immunocompromised state.  Neurological: Positive for slurred speech (resolved) and weakness.  Negative for tremors, syncope, speech difficulty, Hematological: Does not bruise/bleed easily.  All other systems reviewed and are negative  Past Medical History:  Diagnosis Date   Diabetes mellitus without complication ([place])    Hypertension    History reviewed. No pertinent surgical history.  Social History:  reports that she has never smoked. She has never used smokeless tobacco. She reports that she does not drink alcohol and does not use drugs.   No Known Allergies  Family History  Problem Relation Age of Onset   Hypertension Father    Diabetes Father    Hypertension Sister    Mental illness Sister    Mental illness Brother  Alcohol abuse Paternal Grandfather    Diabetes Maternal Aunt      Prior to Admission medications   Medication Sig Start Date End Date Taking? Authorizing Provider  amLODipine (NORVASC) 5 MG tablet Take 1 tablet (5 mg total) by mouth daily.  05/18/21 06/17/21 Yes Couture, Cortni S, PA-C  losartan-hydrochlorothiazide (HYZAAR) 100-25 MG tablet TAKE 1 TABLET BY MOUTH EVERY DAY 04/19/21  Yes Hoyt Koch, MD  metFORMIN (GLUCOPHAGE-XR) 750 MG 24 hr tablet TAKE 1 TABLET (750 MG TOTAL) BY MOUTH 2 (TWO) TIMES DAILY WITH A MEAL. 04/28/21  Yes Hoyt Koch, MD  Multiple Vitamins-Minerals (WOMENS MULTI VITAMIN & MINERAL PO) Take by mouth.   Yes [provider]    Physical Exam: BP (!) 166/87   Pulse 92   Temp 98.3 F (36.8 C)   Resp 12   Ht '5\' 6"'$  (1.676 m)   Wt 70.7 kg   LMP 06/11/2021 (Exact Date)   SpO2 100%   BMI 25.15 kg/m   General: 50 y.o. year-old female well developed well nourished in no acute distress.  Alert and oriented x3. HEENT: NCAT, EOMI Neck: Supple, trachea medial Cardiovascular: Regular rate and rhythm with no rubs or gallops.  No thyromegaly or JVD noted.  No lower extremity edema. 2/4 pulses in all 4 extremities. Respiratory: Clear to auscultation with no wheezes or rales. Good inspiratory effort. Abdomen: Soft, nontender nondistended with normal bowel sounds x4 quadrants. Muskuloskeletal:  No cyanosis, clubbing or edema noted bilaterally Neuro: CN II-XII intact, Hand grip was 4/5 in RUE and 5/5 in LUE. Heel-to-shin motion was normal.  sensation, reflexes intact Skin: No ulcerative lesions noted or rashes Psychiatry: Judgement and insight appear normal. Mood is appropriate for condition and setting          Labs on Admission:  Basic Metabolic Panel: Recent Labs  Lab 06/17/21 1524  NA 136  K 4.8  CL 105  CO2 24  GLUCOSE 142*  BUN 17  CREATININE 1.44*  CALCIUM 8.9   Liver Function Tests: No results for input(s): AST, ALT, ALKPHOS, BILITOT, PROT, ALBUMIN in the last 168 hours. No results for input(s): LIPASE, AMYLASE in the last 168 hours. No results for input(s): AMMONIA in the last 168 hours. CBC: Recent Labs  Lab 06/17/21 1524  WBC 6.6  HGB 10.1*  HCT 30.4*  MCV  88.6  PLT 472*   Cardiac Enzymes: No results for input(s): CKTOTAL, CKMB, CKMBINDEX, TROPONINI in the last 168 hours.  BNP (last 3 results) No results for input(s): BNP in the last 8760 hours.  ProBNP (last 3 results) No results for input(s): PROBNP in the last 8760 hours.  CBG: Recent Labs  Lab 06/17/21 1527  GLUCAP 115*    Radiological Exams on Admission: CT HEAD WO CONTRAST (5MM)  Result Date: 06/17/2021 CLINICAL DATA:  Cerebral hemorrhage suspected. Slurred speech, right-sided weakness, multiple falls. EXAM: CT HEAD WITHOUT CONTRAST TECHNIQUE: Contiguous axial images were obtained from the base of the skull through the vertex without intravenous contrast. COMPARISON:  None. FINDINGS: Brain: Normal anatomic configuration. No abnormal intra or extra-axial mass lesion or fluid collection. No abnormal mass effect or midline shift. No evidence of acute intracranial hemorrhage or infarct. Ventricular size is normal. Cerebellum unremarkable. Vascular: No asymmetric hyperdense vasculature at the skull base. Mild atherosclerotic calcification within the carotid siphons bilaterally. Skull: Intact Sinuses/Orbits: Paranasal sinuses are clear. Orbits are unremarkable. Other: Mastoid air cells and middle ear cavities are clear. IMPRESSION: No acute intracranial abnormality. Electronically Signed  By: Fidela Salisbury M.D.   On: 06/17/2021 20:16    EKG: I independently viewed the EKG done and my findings are as followed: Normal sinus rhythm at a rate of 94 bpm  Assessment/Plan Present on Admission:  Essential hypertension  Principal Problem:   Right sided weakness Active Problems:   Essential hypertension   Thrombocytosis   Hyperglycemia due to diabetes mellitus (HCC)   Normocytic anemia  Right-sided weakness rule out acute ischemic stroke Patient complained of description, gait abnormality with deviation to the right on ambulation and slurred speech (resolved) CT of head showed no acute  intracranial abnormality Patient will be admitted to telemetry unit  CT angiography of head and neck Echocardiogram in the morning MRI of brain without contrast in the morning Continue aspirin and consider starting statin Continue fall precautions and neuro checks Lipid panel and hemoglobin A1c will be checked Continue PT/SLP/OT eval and treat Bedside swallow eval by nursing prior to diet Neurologist (Dr. Theda Sers) was consulted and recommended admitting patient to [facility] with plan to consult on patient when she arrives [facility] per [facility] ED physician and medical record.  Thrombocytosis (chronic) Platelets 472, continue to monitor platelet levels morning labs  Normocytic anemia Stable  Essential hypertension Permissive hypertension will be allowed at this time except for BP > 220/120  Hyperglycemia secondary to type 2 diabetes mellitus Continue ISS and hypoglycemic protocol   DVT prophylaxis: SCDs  Code Status: Full code  Family Communication: None at bedside  Disposition Plan:  Patient is from:                        home Anticipated DC to:                   SNF or family members home Anticipated DC date:               2-3 days Anticipated DC barriers:          Patient requires inpatient management due to strokelike symptoms pending further work-up neurology consult   Consults called: Neurology  Admission status: Observation    Bernadette Hoit MD [facility]  06/18/2021, 12:03 AM

## 2021-06-17 NOTE — ED Provider Notes (Addendum)
  [place]   [id number] 06/17/21 Arrival Time: [id number]  CC: fall; weakness Abbreviated note SUBJECTIVE:  Lori Fleming is a 50 y.o. female who presents with complaint of RT sided weakness and balance issues since fall 2 days ago.  Denies hitting head or LOC.  Denies fever, chills, nausea, vomiting, chest pain, syncope, SOB, weakness, slurred speech, memory or emotional changes, facial drooping/ asymmetry, incoordination, numbness or tingling, abdominal pain, changes in bowel or bladder habits.    ROS: As per HPI.  All other pertinent ROS negative.    Past Medical History:  Diagnosis Date   Diabetes mellitus without complication ([place])    Hypertension    History reviewed. No pertinent surgical history. No Known Allergies No current facility-administered medications on file prior to encounter.   Current Outpatient Medications on File Prior to Encounter  Medication Sig Dispense Refill   amLODipine (NORVASC) 5 MG tablet Take 1 tablet (5 mg total) by mouth daily. 30 tablet 0   losartan-hydrochlorothiazide (HYZAAR) 100-25 MG tablet TAKE 1 TABLET BY MOUTH EVERY DAY 90 tablet 1   metFORMIN (GLUCOPHAGE-XR) 750 MG 24 hr tablet TAKE 1 TABLET (750 MG TOTAL) BY MOUTH 2 (TWO) TIMES DAILY WITH A MEAL. 180 tablet 1   Multiple Vitamins-Minerals (WOMENS MULTI VITAMIN & MINERAL PO) Take by mouth.     Social History   Socioeconomic History   Marital status: Married    Spouse name: Not on file   Number of children: Not on file   Years of education: Not on file   Highest education level: Not on file  Occupational History   Not on file  Tobacco Use   Smoking status: Never   Smokeless tobacco: Never  Substance and Sexual Activity   Alcohol use: No    Alcohol/week: 0.0 standard drinks   Drug use: No   Sexual activity: Not on file  Other Topics Concern   Not on file  Social History Narrative   Not on file   Social Determinants of Health   Financial Resource Strain: Not on file   Food Insecurity: Not on file  Transportation Needs: Not on file  Physical Activity: Not on file  Stress: Not on file  Social Connections: Not on file  Intimate Partner Violence: Not on file   Family History  Problem Relation Age of Onset   Hypertension Father    Diabetes Father    Hypertension Sister    Mental illness Sister    Mental illness Brother    Alcohol abuse Paternal Grandfather    Diabetes Maternal Aunt     OBJECTIVE:  Vitals:   06/17/21 1421  BP: (!) 162/96  Pulse: (!) 112  Resp: 18  Temp: 99.1 F (37.3 C)  TempSrc: Oral  SpO2: 98%    General appearance: alert; no distress Eyes: PERRLA; EOMI; conjunctiva normal HENT: normocephalic; atraumatic; TMs normal; nasal mucosa normal; oral mucosa normal Neck: supple with FROM Lungs: clear to auscultation bilaterally Heart: regular rate and rhythm Skin: warm and dry Neurologic: RT sided weakness vs. LT side; CN 2-12 grossly intact; no facial droop, slurred speech Psychological: alert and cooperative; normal mood and affect  ASSESSMENT & PLAN:  1. Fall, initial encounter   2. Right sided weakness    Patient with stroke like symptoms.  EMS called and transferred to ED for further evaluation and management     Lestine Box, PA-C 06/17/21 [place], [place], [place] 06/17/21 1436

## 2021-06-17 NOTE — ED Triage Notes (Signed)
[facility] from [facility].  2 days ago she fell/rolled out of the bed, 5 hours later she noted slurred speech, right side weakness both have resolved.  [place] again today/lost balance. Denies hitting head. Went to UC to be evaled Ems denies positive stroke screen HTN, ST. DM 160cbg  22g l ac

## 2021-06-17 NOTE — Discharge Instructions (Signed)
Patient with stroke like symptoms.  EMS called and transferred to ED for further evaluation and management

## 2021-06-18 ENCOUNTER — Observation Stay (HOSPITAL_COMMUNITY): Payer: BC Managed Care – PPO

## 2021-06-18 ENCOUNTER — Other Ambulatory Visit: Payer: Self-pay

## 2021-06-18 DIAGNOSIS — R270 Ataxia, unspecified: Secondary | ICD-10-CM | POA: Diagnosis present

## 2021-06-18 DIAGNOSIS — I10 Essential (primary) hypertension: Secondary | ICD-10-CM | POA: Diagnosis present

## 2021-06-18 DIAGNOSIS — Z833 Family history of diabetes mellitus: Secondary | ICD-10-CM | POA: Diagnosis not present

## 2021-06-18 DIAGNOSIS — R471 Dysarthria and anarthria: Secondary | ICD-10-CM | POA: Diagnosis present

## 2021-06-18 DIAGNOSIS — E785 Hyperlipidemia, unspecified: Secondary | ICD-10-CM | POA: Diagnosis present

## 2021-06-18 DIAGNOSIS — G459 Transient cerebral ischemic attack, unspecified: Secondary | ICD-10-CM | POA: Diagnosis not present

## 2021-06-18 DIAGNOSIS — G8191 Hemiplegia, unspecified affecting right dominant side: Secondary | ICD-10-CM | POA: Diagnosis present

## 2021-06-18 DIAGNOSIS — Z8249 Family history of ischemic heart disease and other diseases of the circulatory system: Secondary | ICD-10-CM | POA: Diagnosis not present

## 2021-06-18 DIAGNOSIS — R29818 Other symptoms and signs involving the nervous system: Secondary | ICD-10-CM | POA: Diagnosis not present

## 2021-06-18 DIAGNOSIS — D75839 Thrombocytosis, unspecified: Secondary | ICD-10-CM | POA: Diagnosis present

## 2021-06-18 DIAGNOSIS — N179 Acute kidney failure, unspecified: Secondary | ICD-10-CM | POA: Diagnosis present

## 2021-06-18 DIAGNOSIS — R29701 NIHSS score 1: Secondary | ICD-10-CM | POA: Diagnosis not present

## 2021-06-18 DIAGNOSIS — Y92003 Bedroom of unspecified non-institutional (private) residence as the place of occurrence of the external cause: Secondary | ICD-10-CM | POA: Diagnosis not present

## 2021-06-18 DIAGNOSIS — Z20822 Contact with and (suspected) exposure to covid-19: Secondary | ICD-10-CM | POA: Diagnosis not present

## 2021-06-18 DIAGNOSIS — Z7984 Long term (current) use of oral hypoglycemic drugs: Secondary | ICD-10-CM | POA: Diagnosis not present

## 2021-06-18 DIAGNOSIS — I639 Cerebral infarction, unspecified: Secondary | ICD-10-CM | POA: Diagnosis present

## 2021-06-18 DIAGNOSIS — Z79899 Other long term (current) drug therapy: Secondary | ICD-10-CM | POA: Diagnosis not present

## 2021-06-18 DIAGNOSIS — R29700 NIHSS score 0: Secondary | ICD-10-CM | POA: Diagnosis not present

## 2021-06-18 DIAGNOSIS — E042 Nontoxic multinodular goiter: Secondary | ICD-10-CM | POA: Diagnosis not present

## 2021-06-18 DIAGNOSIS — R531 Weakness: Secondary | ICD-10-CM | POA: Diagnosis not present

## 2021-06-18 DIAGNOSIS — E860 Dehydration: Secondary | ICD-10-CM | POA: Diagnosis present

## 2021-06-18 DIAGNOSIS — E1165 Type 2 diabetes mellitus with hyperglycemia: Secondary | ICD-10-CM

## 2021-06-18 DIAGNOSIS — W06XXXA Fall from bed, initial encounter: Secondary | ICD-10-CM | POA: Diagnosis present

## 2021-06-18 DIAGNOSIS — D649 Anemia, unspecified: Secondary | ICD-10-CM | POA: Diagnosis present

## 2021-06-18 DIAGNOSIS — R29702 NIHSS score 2: Secondary | ICD-10-CM | POA: Diagnosis not present

## 2021-06-18 DIAGNOSIS — I6523 Occlusion and stenosis of bilateral carotid arteries: Secondary | ICD-10-CM | POA: Diagnosis not present

## 2021-06-18 LAB — LIPID PANEL
Cholesterol: 211 mg/dL — ABNORMAL HIGH (ref 0–200)
HDL: 38 mg/dL — ABNORMAL LOW (ref >40)
LDL Cholesterol: 151 mg/dL — ABNORMAL HIGH (ref 0–99)
Total CHOL/HDL Ratio: 5.6 RATIO
Triglycerides: 110 mg/dL (ref <150)
VLDL: 22 mg/dL (ref 0–40)

## 2021-06-18 LAB — COMPREHENSIVE METABOLIC PANEL
ALT: 9 U/L (ref 0–44)
AST: 11 U/L — ABNORMAL LOW (ref 15–41)
Albumin: 3.5 g/dL (ref 3.5–5.0)
Alkaline Phosphatase: 57 U/L (ref 38–126)
Anion gap: 7 (ref 5–15)
BUN: 14 mg/dL (ref 6–20)
CO2: 23 mmol/L (ref 22–32)
Calcium: 8.8 mg/dL — ABNORMAL LOW (ref 8.9–10.3)
Chloride: 104 mmol/L (ref 98–111)
Creatinine, Ser: 1.13 mg/dL — ABNORMAL HIGH (ref 0.44–1.00)
GFR, Estimated: 60 mL/min — ABNORMAL LOW (ref >60)
Glucose, Bld: 114 mg/dL — ABNORMAL HIGH (ref 70–99)
Potassium: 4 mmol/L (ref 3.5–5.1)
Sodium: 134 mmol/L — ABNORMAL LOW (ref 135–145)
Total Bilirubin: 0.5 mg/dL (ref 0.3–1.2)
Total Protein: 6.7 g/dL (ref 6.5–8.1)

## 2021-06-18 LAB — HIV ANTIBODY (ROUTINE TESTING W REFLEX): HIV Screen 4th Generation wRfx: NONREACTIVE

## 2021-06-18 LAB — CBC
HCT: 29 % — ABNORMAL LOW (ref 36.0–46.0)
Hemoglobin: 9.6 g/dL — ABNORMAL LOW (ref 12.0–15.0)
MCH: 28.9 pg (ref 26.0–34.0)
MCHC: 33.1 g/dL (ref 30.0–36.0)
MCV: 87.3 fL (ref 80.0–100.0)
Platelets: 485 10*3/uL — ABNORMAL HIGH (ref 150–400)
RBC: 3.32 MIL/uL — ABNORMAL LOW (ref 3.87–5.11)
RDW: 13.7 % (ref 11.5–15.5)
WBC: 8.4 10*3/uL (ref 4.0–10.5)
nRBC: 0 % (ref 0.0–0.2)

## 2021-06-18 LAB — ECHOCARDIOGRAM COMPLETE
Area-P 1/2: 5.46 cm2
Height: 66 in
S' Lateral: 2.5 cm
Single Plane A4C EF: 70.9 %
Weight: 2492.79 oz

## 2021-06-18 LAB — PHOSPHORUS: Phosphorus: 3.3 mg/dL (ref 2.5–4.6)

## 2021-06-18 LAB — CBG MONITORING, ED
Glucose-Capillary: 109 mg/dL — ABNORMAL HIGH (ref 70–99)
Glucose-Capillary: 110 mg/dL — ABNORMAL HIGH (ref 70–99)
Glucose-Capillary: 115 mg/dL — ABNORMAL HIGH (ref 70–99)
Glucose-Capillary: 99 mg/dL (ref 70–99)

## 2021-06-18 LAB — MAGNESIUM: Magnesium: 2.2 mg/dL (ref 1.7–2.4)

## 2021-06-18 LAB — HEMOGLOBIN A1C
Hgb A1c MFr Bld: 7.2 % — ABNORMAL HIGH (ref 4.8–5.6)
Mean Plasma Glucose: 159.94 mg/dL

## 2021-06-18 LAB — PROTIME-INR
INR: 1 (ref 0.8–1.2)
Prothrombin Time: 12.9 seconds (ref 11.4–15.2)

## 2021-06-18 LAB — APTT: aPTT: 28 seconds (ref 24–36)

## 2021-06-18 LAB — GLUCOSE, CAPILLARY: Glucose-Capillary: 91 mg/dL (ref 70–99)

## 2021-06-18 MED ORDER — IOHEXOL 350 MG/ML SOLN
80.0000 mL | Freq: Once | INTRAVENOUS | Status: AC | PRN
  Administered 2021-06-18: 75 mL via INTRAVENOUS

## 2021-06-18 MED ORDER — SIMVASTATIN 20 MG PO TABS
40.0000 mg | ORAL_TABLET | Freq: Every day | ORAL | Status: DC
  Administered 2021-06-18 – 2021-06-19 (×2): 40 mg via ORAL
  Filled 2021-06-18 (×2): qty 2

## 2021-06-18 MED ORDER — INSULIN ASPART 100 UNIT/ML IJ SOLN
0.0000 [IU] | INTRAMUSCULAR | Status: DC
  Administered 2021-06-19: 1 [IU] via SUBCUTANEOUS

## 2021-06-18 NOTE — Evaluation (Signed)
Physical Therapy Evaluation Patient Details Name: Lori Fleming MRN: [id number] DOB: 08-10-1971 Today's Date: 06/18/2021  History of Present Illness  PT has been having RT sided weakness with hx of falling for the past two days.  Clinical Impression  PT recommended a cane and OP therapy to pt.         Recommendations for follow up therapy are one component of a multi-disciplinary discharge planning process, led by the attending physician.  Recommendations may be updated based on patient status, additional functional criteria and insurance authorization.  Follow Up Recommendations Outpatient PT    Equipment Recommendations       Recommendations for Other Services OT consult     Precautions / Restrictions Precautions Precautions: Fall Restrictions Weight Bearing Restrictions: No      Mobility  Bed Mobility Overal bed mobility: Independent                  Transfers Overall transfer level: Independent                  Ambulation/Gait Ambulation/Gait assistance: Min guard Gait Distance (Feet): 200 Feet Assistive device: None Gait Pattern/deviations: Decreased step length - right;Decreased step length - left;Staggering right     General Gait Details: drifted to Rt on one occasion, stuttered step on one occasion              Balance                                             Pertinent Vitals/Pain Pain Assessment: No/denies pain    Home Living Family/patient expects to be discharged to:: Private residence       Home Access: Level entry              Prior Function Level of Independence: Independent               Extremity/Trunk Assessment        Lower Extremity Assessment Lower Extremity Assessment: RLE deficits/detail RLE Deficits / Details: strength generally 4/5 with mm test       Communication   Communication: No difficulties  Cognition Arousal/Alertness: Awake/alert                                             General Comments      Exercises General Exercises - Lower Extremity Short Arc Quad:  (10 sit to stand) Straight Leg Raises: Right;5 reps Heel Raises: 10 reps Mini-Sqauts: 10 reps   Assessment/Plan    PT Assessment All further PT needs can be met in the next venue of care  PT Problem List         PT Treatment Interventions      PT Goals (Current goals can be found in the Care Plan section)  Acute Rehab PT Goals Potential to Achieve Goals: Good    Frequency  2x a weekd    Barriers to discharge    none    Co-evaluation               AM-PAC PT "6 Clicks" Mobility  Outcome Measure Help needed turning from your back to your side while in a flat bed without using bedrails?: None Help needed moving from lying on your back to sitting  on the side of a flat bed without using bedrails?: None Help needed moving to and from a bed to a chair (including a wheelchair)?: None Help needed standing up from a chair using your arms (e.g., wheelchair or bedside chair)?: None Help needed to walk in hospital room?: None Help needed climbing 3-5 steps with a railing? : A Little 6 Click Score: 23    End of Session         PT Visit Diagnosis: Unsteadiness on feet (R26.81)    Time: 0177-9390 PT Time Calculation (min) (ACUTE ONLY): 23 min   Charges:   PT Evaluation $PT Eval Low Complexity: [place], PT CLT [phone]  06/18/2021, 12:24 PM

## 2021-06-18 NOTE — Progress Notes (Signed)
  Echocardiogram 2D Echocardiogram has been performed.  Lori Fleming 06/18/2021, 12:33 PM

## 2021-06-18 NOTE — Progress Notes (Signed)
PROGRESS NOTE    Lori Fleming  [id number] DOB: 1971-07-26 DOA: 06/17/2021 PCP: Hoyt Koch, MD   Brief Narrative:    Lori Fleming is a 50 y.o. female with medical history significant for essential HTN and type 2 diabetes mellitus who presents to the emergency department due to 2-day onset of weakness in the right and gait difficulty.  She was noted to have right-sided weakness for several days and was admitted for TIA/CVA evaluation with plans to transfer to [facility] for further imaging and neurology evaluation.  CT head thus far with no acute findings.  She continues to have right-sided hemiparesis.  Assessment & Plan:   Principal Problem:   Right sided weakness Active Problems:   Essential hypertension   Thrombocytosis   Hyperglycemia due to diabetes mellitus (HCC)   Normocytic anemia   Right-sided hemiparesis suspicious of TIA/CVA -CT head with no acute abnormalities -Brain MRI and 2D echocardiogram pending -Continue aspirin -PT/OT/SLP evaluation pending -LDL 151, initiate statin -A1c pending -Plan to transfer to [facility] for further evaluation with neurology  Chronic thrombocytosis -Continue to monitor  Normocytic anemia -Stable, monitor CBC  Essential hypertension -Hold antihypertensives and allow for permissive hypertension  Type 2 diabetes -Without significant hyperglycemia -Monitor closely while n.p.o. and maintain on SSI   DVT prophylaxis: SCDs Code Status: Full Family Communication: None at bedside Disposition Plan:  Status is: Observation  The patient will require care spanning > 2 midnights and should be moved to inpatient because: Ongoing diagnostic testing needed not appropriate for outpatient work up, IV treatments appropriate due to intensity of illness or inability to take PO, and Inpatient level of care appropriate due to severity of illness  Dispo: The patient is from: Home              Anticipated d/c is to: Home               Patient currently is not medically stable to d/c.   Difficult to place patient No   Consultants:  Neurology  Procedures:  See below  Antimicrobials:  None   Subjective: Patient seen and evaluated today with no new acute complaints or concerns.  She continues to have some right-sided weakness.  No acute concerns or events noted overnight.  Objective: Vitals:   06/18/21 0200 06/18/21 0400 06/18/21 0600 06/18/21 0630  BP: (!) 161/87 (!) 143/86 (!) 147/89 (!) 169/97  Pulse: 85 83 82 84  Resp: '17 16 12 10  '$ Temp:      SpO2: 100% 99% 100% 100%  Weight:      Height:       No intake or output data in the 24 hours ending 06/18/21 0917 Filed Weights   06/17/21 1510  Weight: 70.7 kg    Examination:  General exam: Appears calm and comfortable  Respiratory system: Clear to auscultation. Respiratory effort normal. Cardiovascular system: S1 & S2 heard, RRR.  Gastrointestinal system: Abdomen is soft Central nervous system: Alert and awake Extremities: No edema Skin: No significant lesions noted Psychiatry: Flat affect.    Data Reviewed: I have personally reviewed following labs and imaging studies  CBC: Recent Labs  Lab 06/17/21 1524 06/18/21 0559  WBC 6.6 8.4  HGB 10.1* 9.6*  HCT 30.4* 29.0*  MCV 88.6 87.3  PLT 472* 123456*   Basic Metabolic Panel: Recent Labs  Lab 06/17/21 1524 06/18/21 0559  NA 136 134*  K 4.8 4.0  CL 105 104  CO2 24 23  GLUCOSE 142* 114*  BUN 17 14  CREATININE 1.44* 1.13*  CALCIUM 8.9 8.8*  MG  --  2.2  PHOS  --  3.3   GFR: Estimated Creatinine Clearance: 56.4 mL/min (A) (by C-G formula based on SCr of 1.13 mg/dL (H)). Liver Function Tests: Recent Labs  Lab 06/18/21 0559  AST 11*  ALT 9  ALKPHOS 57  BILITOT 0.5  PROT 6.7  ALBUMIN 3.5   No results for input(s): LIPASE, AMYLASE in the last 168 hours. No results for input(s): AMMONIA in the last 168 hours. Coagulation Profile: Recent Labs  Lab 06/18/21 0559  INR 1.0    Cardiac Enzymes: No results for input(s): CKTOTAL, CKMB, CKMBINDEX, TROPONINI in the last 168 hours. BNP (last 3 results) No results for input(s): PROBNP in the last 8760 hours. HbA1C: No results for input(s): HGBA1C in the last 72 hours. CBG: Recent Labs  Lab 06/17/21 1527 06/18/21 0052 06/18/21 0356  GLUCAP 115* 110* 109*   Lipid Profile: Recent Labs    06/18/21 0559  CHOL 211*  HDL 38*  LDLCALC 151*  TRIG 110  CHOLHDL 5.6   Thyroid Function Tests: No results for input(s): TSH, T4TOTAL, FREET4, T3FREE, THYROIDAB in the last 72 hours. Anemia Panel: No results for input(s): VITAMINB12, FOLATE, FERRITIN, TIBC, IRON, RETICCTPCT in the last 72 hours. Sepsis Labs: No results for input(s): PROCALCITON, LATICACIDVEN in the last 168 hours.  Recent Results (from the past 240 hour(s))  Resp Panel by RT-PCR (Flu A&B, Covid) Nasopharyngeal Swab     Status: None   Collection Time: 06/17/21  8:20 PM   Specimen: Nasopharyngeal Swab; Nasopharyngeal(NP) swabs in vial transport medium  Result Value Ref Range Status   SARS Coronavirus 2 by RT PCR NEGATIVE NEGATIVE Final    Comment: (NOTE) SARS-CoV-2 target nucleic acids are NOT DETECTED.  The SARS-CoV-2 RNA is generally detectable in upper respiratory specimens during the acute phase of infection. The lowest concentration of SARS-CoV-2 viral copies this assay can detect is 138 copies/mL. A negative result does not preclude SARS-Cov-2 infection and should not be used as the sole basis for treatment or other patient management decisions. A negative result may occur with  improper specimen collection/handling, submission of specimen other than nasopharyngeal swab, presence of viral mutation(s) within the areas targeted by this assay, and inadequate number of viral copies(<138 copies/mL). A negative result must be combined with clinical observations, patient history, and epidemiological information. The expected result is  Negative.  Fact Sheet for Patients:  EntrepreneurPulse.com.au  Fact Sheet for Healthcare Providers:  IncredibleEmployment.be  This test is no t yet approved or cleared by the [place] FDA and  has been authorized for detection and/or diagnosis of SARS-CoV-2 by FDA under an Emergency Use Authorization (EUA). This EUA will remain  in effect (meaning this test can be used) for the duration of the COVID-19 declaration under Section 564(b)(1) of the Act, 21 U.S.C.section 360bbb-3(b)(1), unless the authorization is terminated  or revoked sooner.       Influenza A by PCR NEGATIVE NEGATIVE Final   Influenza B by PCR NEGATIVE NEGATIVE Final    Comment: (NOTE) The Xpert Xpress SARS-CoV-2/FLU/RSV plus assay is intended as an aid in the diagnosis of influenza from Nasopharyngeal swab specimens and should not be used as a sole basis for treatment. Nasal washings and aspirates are unacceptable for Xpert Xpress SARS-CoV-2/FLU/RSV testing.  Fact Sheet for Patients: EntrepreneurPulse.com.au  Fact Sheet for Healthcare Providers: IncredibleEmployment.be  This test is not yet approved or cleared by the [place]  States FDA and has been authorized for detection and/or diagnosis of SARS-CoV-2 by FDA under an Emergency Use Authorization (EUA). This EUA will remain in effect (meaning this test can be used) for the duration of the COVID-19 declaration under Section 564(b)(1) of the Act, 21 U.S.C. section 360bbb-3(b)(1), unless the authorization is terminated or revoked.  Performed at [facility], [address], [place], [place] [address]          Radiology Studies: CT ANGIO HEAD NECK W WO CM  Result Date: 06/18/2021 CLINICAL DATA:  Initial evaluation for neuro deficit, stroke suspected. EXAM: CT ANGIOGRAPHY HEAD AND NECK TECHNIQUE: Multidetector CT imaging of the head and neck was performed using the standard protocol  during bolus administration of intravenous contrast. Multiplanar CT image reconstructions and MIPs were obtained to evaluate the vascular anatomy. Carotid stenosis measurements (when applicable) are obtained utilizing NASCET criteria, using the distal internal carotid diameter as the denominator. CONTRAST:  87m OMNIPAQUE IOHEXOL 350 MG/ML SOLN COMPARISON:  CT from 06/17/2021. FINDINGS: CTA NECK FINDINGS Aortic arch: Visualized aortic arch normal in caliber with normal 3 vessel morphology. No stenosis or other abnormality about the origin of the great vessels. Right carotid system: Right common and internal carotid arteries patent without stenosis, dissection or occlusion. Mild for age atheromatous change about the right bifurcation without stenosis. Left carotid system: Left common and internal carotid arteries patent without stenosis, dissection or occlusion. Minimal for age atheromatous change about the left bifurcation without stenosis. Vertebral arteries: Both vertebral arteries arise from the subclavian arteries. No proximal subclavian artery stenosis. Both vertebral arteries widely patent without stenosis, dissection or occlusion. Skeleton: No significant spondylosis for age. No discrete or worrisome osseous lesions. Other neck: No other acute soft tissue abnormality within the neck. Few small right thyroid nodules noted, largest of which measures 6 mm, of doubtful significance given size and patient age, no follow-up imaging recommended (ref: J Am Coll Radiol. 2015 Feb;12(2): 143-50).No other mass or adenopathy. Upper chest: Visualized upper chest demonstrates no acute finding. Partially visualized lungs are clear. Review of the MIP images confirms the above findings CTA HEAD FINDINGS Anterior circulation: Both internal carotid arteries widely patent to the termini without stenosis or other abnormality. A1 segments widely patent. 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 No M1 stenosis or  occlusion. Normal MCA bifurcations. Distal MCA branches well perfused and symmetric. Posterior circulation: Vertebral arteries largely code dominant and widely patent to the vertebrobasilar junction. Neither PICA origin well visualized. Basilar widely patent. Superior cerebral arteries patent bilaterally. Both PCAs primarily supplied via the basilar well perfused to their distal aspects. Venous sinuses: Patent. Anatomic variants: Azygous ACA.  No aneurysm. Review of the MIP images confirms the above findings IMPRESSION: 1. Negative CTA of the head and neck. No large vessel occlusion, hemodynamically significant stenosis, or other acute vascular abnormality. 2. Azygous ACA. Electronically Signed   By: BJeannine BogaM.D.   On: 06/18/2021 01:44   CT HEAD WO CONTRAST (5MM)  Result Date: 06/17/2021 CLINICAL DATA:  Cerebral hemorrhage suspected. Slurred speech, right-sided weakness, multiple falls. EXAM: CT HEAD WITHOUT CONTRAST TECHNIQUE: Contiguous axial images were obtained from the base of the skull through the vertex without intravenous contrast. COMPARISON:  None. FINDINGS: Brain: Normal anatomic configuration. No abnormal intra or extra-axial mass lesion or fluid collection. No abnormal mass effect or midline shift. No evidence of acute intracranial hemorrhage or infarct. Ventricular size is normal. Cerebellum unremarkable. Vascular: No asymmetric hyperdense vasculature at the skull base.  Mild atherosclerotic calcification within the carotid siphons bilaterally. Skull: Intact Sinuses/Orbits: Paranasal sinuses are clear. Orbits are unremarkable. Other: Mastoid air cells and middle ear cavities are clear. IMPRESSION: No acute intracranial abnormality. Electronically Signed   By: Fidela Salisbury M.D.   On: 06/17/2021 20:16        Scheduled Meds:  aspirin EC  81 mg Oral Daily   insulin aspart  0-9 Units Subcutaneous Q4H     LOS: 0 days    Time spent: 35 minutes    Siraj Dermody Darleen Crocker, DO [facility]  If 7PM-7AM, please contact night-coverage [email or web address] 06/18/2021, 9:17 AM

## 2021-06-19 ENCOUNTER — Inpatient Hospital Stay (HOSPITAL_COMMUNITY): Payer: BC Managed Care – PPO

## 2021-06-19 DIAGNOSIS — R471 Dysarthria and anarthria: Secondary | ICD-10-CM | POA: Diagnosis not present

## 2021-06-19 DIAGNOSIS — G8191 Hemiplegia, unspecified affecting right dominant side: Secondary | ICD-10-CM | POA: Diagnosis not present

## 2021-06-19 LAB — CBC
HCT: 30.6 % — ABNORMAL LOW (ref 36.0–46.0)
Hemoglobin: 10.1 g/dL — ABNORMAL LOW (ref 12.0–15.0)
MCH: 29.1 pg (ref 26.0–34.0)
MCHC: 33 g/dL (ref 30.0–36.0)
MCV: 88.2 fL (ref 80.0–100.0)
Platelets: 519 10*3/uL — ABNORMAL HIGH (ref 150–400)
RBC: 3.47 MIL/uL — ABNORMAL LOW (ref 3.87–5.11)
RDW: 13.6 % (ref 11.5–15.5)
WBC: 7.8 10*3/uL (ref 4.0–10.5)
nRBC: 0 % (ref 0.0–0.2)

## 2021-06-19 LAB — BASIC METABOLIC PANEL
Anion gap: 10 (ref 5–15)
BUN: 18 mg/dL (ref 6–20)
CO2: 21 mmol/L — ABNORMAL LOW (ref 22–32)
Calcium: 8.8 mg/dL — ABNORMAL LOW (ref 8.9–10.3)
Chloride: 103 mmol/L (ref 98–111)
Creatinine, Ser: 1.69 mg/dL — ABNORMAL HIGH (ref 0.44–1.00)
GFR, Estimated: 37 mL/min — ABNORMAL LOW (ref >60)
Glucose, Bld: 117 mg/dL — ABNORMAL HIGH (ref 70–99)
Potassium: 4 mmol/L (ref 3.5–5.1)
Sodium: 134 mmol/L — ABNORMAL LOW (ref 135–145)

## 2021-06-19 LAB — MAGNESIUM: Magnesium: 2.3 mg/dL (ref 1.7–2.4)

## 2021-06-19 LAB — GLUCOSE, CAPILLARY
Glucose-Capillary: 104 mg/dL — ABNORMAL HIGH (ref 70–99)
Glucose-Capillary: 108 mg/dL — ABNORMAL HIGH (ref 70–99)
Glucose-Capillary: 112 mg/dL — ABNORMAL HIGH (ref 70–99)
Glucose-Capillary: 115 mg/dL — ABNORMAL HIGH (ref 70–99)
Glucose-Capillary: 119 mg/dL — ABNORMAL HIGH (ref 70–99)
Glucose-Capillary: 137 mg/dL — ABNORMAL HIGH (ref 70–99)

## 2021-06-19 MED ORDER — ASPIRIN EC 325 MG PO TBEC
325.0000 mg | DELAYED_RELEASE_TABLET | Freq: Every day | ORAL | Status: DC
  Administered 2021-06-20: 325 mg via ORAL
  Filled 2021-06-19: qty 1

## 2021-06-19 MED ORDER — SODIUM CHLORIDE 0.9 % IV SOLN: INTRAVENOUS | Status: DC

## 2021-06-19 NOTE — TOC Progression Note (Signed)
Referral to [facility] entered.

## 2021-06-19 NOTE — Consult Note (Signed)
Lori A. Merlene Laughter, MD     [email or web address]          Lori Fleming is an 50 y.o. female.   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 Dual antiplatelet agents are recommended for 3 weeks. Afterwards aspirin 325 is recommended. Vigorous long-term blood pressure and blood sugar control is recommended. Agree with occupational and physical therapy. The patient also should be on a statin. Additional labs have also been obtained.       Patient presents with a 2 day history dysarthria, right hemiparesis and falling. She denies dizziness, dyspnea or dysphagia. The patient reports that her symptoms have improved since being hospitalized. She denies headaches, palpitation or chest pain.  She reports that her hemoglobin A1c have been 7.7 recently. He was as high as 11 in January of this year. She is on oral medications for this.The review systems otherwise negative.    GENERAL:  This is a very pleasant thin female who is doing well this time.  HEENT:  Neck is supple no trauma noted.  ABDOMEN: soft  EXTREMITIES: No edema   BACK: Normal  SKIN: Normal by inspection.    MENTAL STATUS: Alert and oriented -  including orientation to her age and the month. Speech -  mild dysarthria is noted; language ( able to name 6/6 objects at the bedside with good comprehension and the speech expression) and cognition are generally intact. Judgment and insight normal.   CRANIAL NERVES: Pupils are equal, round and reactive to light and accomodation; extra ocular movements are full, there is no significant nystagmus; visual fields are full; upper and lower facial muscles are normal in strength and symmetric, there is no flattening of the nasolabial folds; tongue is midline; uvula is midline; shoulder elevation is normal.  MOTOR:  There is mild weakness of the hand and the right foot most  evidence with hand dexterity impairment which is moderate to severe and also foot tapping which is moderate. Interestingly, I appreciate no drift although she does have a classic  pronator drift of the right upper extremity. Otherwise, tone, bulk and strength are normal throughout.  COORDINATION: Left finger to nose is normal, right finger to nose is normal, No rest tremor; no intention tremor; no postural tremor; no bradykinesia.  REFLEXES: Deep tendon reflexes are symmetrical and normal.   SENSATION: Normal to light touch, temperature, and pain. There is no extension to double simultaneous stimulation.     Blood pressure (!) 142/78, pulse 86, temperature 98 F (36.7 C), temperature source Oral, resp. rate 17, height '5\' 6"'$  (1.676 m), weight 68.9 kg, last menstrual period 06/11/2021, SpO2 100 %.  Past Medical History:  Diagnosis Date   Diabetes mellitus without complication ([place])    Hypertension     History reviewed. No pertinent surgical history.  Family History  Problem Relation Age of Onset   Hypertension Father    Diabetes Father    Hypertension Sister    Mental illness Sister    Mental illness Brother    Alcohol abuse Paternal Grandfather    Diabetes Maternal Aunt     Social History:  reports that she has never smoked. She has never used smokeless tobacco. She reports that she does not drink alcohol and does not use drugs.  Allergies: No Known Allergies  Medications: Prior to Admission medications   Medication Sig Start Date End Date Taking? Authorizing Provider  amLODipine (NORVASC) 5 MG tablet Take 1  tablet (5 mg total) by mouth daily. 05/18/21 06/17/21 Yes Couture, Cortni S, PA-C  losartan-hydrochlorothiazide (HYZAAR) 100-25 MG tablet TAKE 1 TABLET BY MOUTH EVERY DAY 04/19/21  Yes Hoyt Koch, MD  metFORMIN (GLUCOPHAGE-XR) 750 MG 24 hr tablet TAKE 1 TABLET (750 MG TOTAL) BY MOUTH 2 (TWO) TIMES DAILY WITH A MEAL. 04/28/21  Yes Hoyt Koch, MD  Multiple  Vitamins-Minerals (WOMENS MULTI VITAMIN & MINERAL PO) Take by mouth.   Yes [provider]    Scheduled Meds:  aspirin EC  81 mg Oral Daily   insulin aspart  0-9 Units Subcutaneous Q4H   simvastatin  40 mg Oral q1800   Continuous Infusions:  sodium chloride 100 mL/hr at 06/19/21 1548   PRN Meds:.     Results for orders placed or performed during the hospital encounter of 06/17/21 (from the past 48 hour(s))  Resp Panel by RT-PCR (Flu A&B, Covid) Nasopharyngeal Swab     Status: None   Collection Time: 06/17/21  8:20 PM   Specimen: Nasopharyngeal Swab; Nasopharyngeal(NP) swabs in vial transport medium  Result Value Ref Range   SARS Coronavirus 2 by RT PCR NEGATIVE NEGATIVE    Comment: (NOTE) SARS-CoV-2 target nucleic acids are NOT DETECTED.  The SARS-CoV-2 RNA is generally detectable in upper respiratory specimens during the acute phase of infection. The lowest concentration of SARS-CoV-2 viral copies this assay can detect is 138 copies/mL. A negative result does not preclude SARS-Cov-2 infection and should not be used as the sole basis for treatment or other patient management decisions. A negative result may occur with  improper specimen collection/handling, submission of specimen other than nasopharyngeal swab, presence of viral mutation(s) within the areas targeted by this assay, and inadequate number of viral copies(<138 copies/mL). A negative result must be combined with clinical observations, patient history, and epidemiological information. The expected result is Negative.  Fact Sheet for Patients:  EntrepreneurPulse.com.au  Fact Sheet for Healthcare Providers:  IncredibleEmployment.be  This test is no t yet approved or cleared by the [place] FDA and  has been authorized for detection and/or diagnosis of SARS-CoV-2 by FDA under an Emergency Use Authorization (EUA). This EUA will remain  in effect (meaning this test  can be used) for the duration of the COVID-19 declaration under Section 564(b)(1) of the Act, 21 U.S.C.section 360bbb-3(b)(1), unless the authorization is terminated  or revoked sooner.       Influenza A by PCR NEGATIVE NEGATIVE   Influenza B by PCR NEGATIVE NEGATIVE    Comment: (NOTE) The Xpert Xpress SARS-CoV-2/FLU/RSV plus assay is intended as an aid in the diagnosis of influenza from Nasopharyngeal swab specimens and should not be used as a sole basis for treatment. Nasal washings and aspirates are unacceptable for Xpert Xpress SARS-CoV-2/FLU/RSV testing.  Fact Sheet for Patients: EntrepreneurPulse.com.au  Fact Sheet for Healthcare Providers: IncredibleEmployment.be  This test is not yet approved or cleared by the [place] FDA and has been authorized for detection and/or diagnosis of SARS-CoV-2 by FDA under an Emergency Use Authorization (EUA). This EUA will remain in effect (meaning this test can be used) for the duration of the COVID-19 declaration under Section 564(b)(1) of the Act, 21 U.S.C. section 360bbb-3(b)(1), unless the authorization is terminated or revoked.  Performed at [facility], [address], [place], [place] [address]   CBG monitoring, ED     Status: Abnormal   Collection Time: 06/18/21 12:52 AM  Result Value Ref Range   Glucose-Capillary 110 (H) 70 - 99  mg/dL    Comment: Glucose reference range applies only to samples taken after fasting for at least 8 hours.  CBG monitoring, ED     Status: Abnormal   Collection Time: 06/18/21  3:56 AM  Result Value Ref Range   Glucose-Capillary 109 (H) 70 - 99 mg/dL    Comment: Glucose reference range applies only to samples taken after fasting for at least 8 hours.  HIV Antibody (routine testing w rflx)     Status: None   Collection Time: 06/18/21  5:59 AM  Result Value Ref Range   HIV Screen 4th Generation wRfx Non Reactive Non Reactive    Comment: Performed at [facility], [address] [place], [place] [address]  Comprehensive metabolic panel     Status: Abnormal   Collection Time: 06/18/21  5:59 AM  Result Value Ref Range   Sodium 134 (L) 135 - 145 mmol/L   Potassium 4.0 3.5 - 5.1 mmol/L   Chloride 104 98 - 111 mmol/L   CO2 23 22 - 32 mmol/L   Glucose, Bld 114 (H) 70 - 99 mg/dL    Comment: Glucose reference range applies only to samples taken after fasting for at least 8 hours.   BUN 14 6 - 20 mg/dL   Creatinine, Ser 1.13 (H) 0.44 - 1.00 mg/dL   Calcium 8.8 (L) 8.9 - 10.3 mg/dL   Total Protein 6.7 6.5 - 8.1 g/dL   Albumin 3.5 3.5 - 5.0 g/dL   AST 11 (L) 15 - 41 U/L   ALT 9 0 - 44 U/L   Alkaline Phosphatase 57 38 - 126 U/L   Total Bilirubin 0.5 0.3 - 1.2 mg/dL   GFR, Estimated 60 (L) >60 mL/min    Comment: (NOTE) Calculated using the CKD-EPI Creatinine Equation (2021)    Anion gap 7 5 - 15    Comment: Performed at [facility], [address], [place], [place] [address]  CBC     Status: Abnormal   Collection Time: 06/18/21  5:59 AM  Result Value Ref Range   WBC 8.4 4.0 - 10.5 K/uL   RBC 3.32 (L) 3.87 - 5.11 MIL/uL   Hemoglobin 9.6 (L) 12.0 - 15.0 g/dL   HCT 29.0 (L) 36.0 - 46.0 %   MCV 87.3 80.0 - 100.0 fL   MCH 28.9 26.0 - 34.0 pg   MCHC 33.1 30.0 - 36.0 g/dL   RDW 13.7 11.5 - 15.5 %   Platelets 485 (H) 150 - 400 K/uL   nRBC 0.0 0.0 - 0.2 %    Comment: Performed at [facility], [address] [place], [place] [address]  Protime-INR     Status: None   Collection Time: 06/18/21  5:59 AM  Result Value Ref Range   Prothrombin Time 12.9 11.4 - 15.2 seconds   INR 1.0 0.8 - 1.2    Comment: (NOTE) INR goal varies based on device and disease states. Performed at [facility], [address] [place], [place] [address]   APTT     Status: None   Collection Time: 06/18/21  5:59 AM  Result Value Ref Range   aPTT 28 24 - 36 seconds    Comment: Performed at [facility], [address], [place], [place] [address]  Magnesium      Status: None   Collection Time: 06/18/21  5:59 AM  Result Value Ref Range   Magnesium 2.2 1.7 - 2.4 mg/dL    Comment: Performed at [facility],  [address] [place], [place] [address]  Phosphorus     Status: None   Collection Time: 06/18/21  5:59 AM  Result Value Ref Range   Phosphorus 3.3 2.5 - 4.6 mg/dL    Comment: Performed at [facility], [address] [place], [place] [address]  Hemoglobin A1c     Status: Abnormal   Collection Time: 06/18/21  5:59 AM  Result Value Ref Range   Hgb A1c MFr Bld 7.2 (H) 4.8 - 5.6 %    Comment: (NOTE) Pre diabetes:          5.7%-6.4%  Diabetes:              >6.4%  Glycemic control for   <7.0% adults with diabetes    Mean Plasma Glucose 159.94 mg/dL    Comment: Performed at [facility] [address] [place], [place] [address]  Lipid panel     Status: Abnormal   Collection Time: 06/18/21  5:59 AM  Result Value Ref Range   Cholesterol 211 (H) 0 - 200 mg/dL   Triglycerides 110 <150 mg/dL   HDL 38 (L) >40 mg/dL   Total CHOL/HDL Ratio 5.6 RATIO   VLDL 22 0 - 40 mg/dL   LDL Cholesterol 151 (H) 0 - 99 mg/dL    Comment:        Total Cholesterol/HDL:CHD Risk Coronary Heart Disease Risk Table                     Men   Women  1/2 Average Risk   3.4   3.3  Average Risk       5.0   4.4  2 X Average Risk   9.6   7.1  3 X Average Risk  23.4   11.0        Use the calculated Patient Ratio above and the CHD Risk Table to determine the patient's CHD Risk.        ATP III CLASSIFICATION (LDL):  <100     mg/dL   Optimal  100-129  mg/dL   Near or Above                    Optimal  130-159  mg/dL   Borderline  160-189  mg/dL   High  >190     mg/dL   Very High Performed at [facility], [address], [place], [place] [address]   CBG monitoring, ED     Status: Abnormal   Collection Time: 06/18/21 12:06 PM  Result Value Ref Range   Glucose-Capillary 115 (H) 70 - 99 mg/dL    Comment: Glucose reference range applies only to samples taken  after fasting for at least 8 hours.  CBG monitoring, ED     Status: None   Collection Time: 06/18/21  4:10 PM  Result Value Ref Range   Glucose-Capillary 99 70 - 99 mg/dL    Comment: Glucose reference range applies only to samples taken after fasting for at least 8 hours.  Glucose, capillary     Status: None   Collection Time: 06/18/21  8:09 PM  Result Value Ref Range   Glucose-Capillary 91 70 - 99 mg/dL    Comment: Glucose reference range applies only to samples taken after fasting for at least 8 hours.  Glucose, capillary     Status: Abnormal   Collection Time: 06/19/21 12:39 AM  Result Value Ref Range   Glucose-Capillary 104 (H) 70 - 99 mg/dL  Comment: Glucose reference range applies only to samples taken after fasting for at least 8 hours.  Glucose, capillary     Status: Abnormal   Collection Time: 06/19/21  4:54 AM  Result Value Ref Range   Glucose-Capillary 112 (H) 70 - 99 mg/dL    Comment: Glucose reference range applies only to samples taken after fasting for at least 8 hours.  Basic metabolic panel     Status: Abnormal   Collection Time: 06/19/21  5:54 AM  Result Value Ref Range   Sodium 134 (L) 135 - 145 mmol/L   Potassium 4.0 3.5 - 5.1 mmol/L   Chloride 103 98 - 111 mmol/L   CO2 21 (L) 22 - 32 mmol/L   Glucose, Bld 117 (H) 70 - 99 mg/dL    Comment: Glucose reference range applies only to samples taken after fasting for at least 8 hours.   BUN 18 6 - 20 mg/dL   Creatinine, Ser 1.69 (H) 0.44 - 1.00 mg/dL   Calcium 8.8 (L) 8.9 - 10.3 mg/dL   GFR, Estimated 37 (L) >60 mL/min    Comment: (NOTE) Calculated using the CKD-EPI Creatinine Equation (2021)    Anion gap 10 5 - 15    Comment: Performed at [facility], [address], [place], [place] [address]  CBC     Status: Abnormal   Collection Time: 06/19/21  5:54 AM  Result Value Ref Range   WBC 7.8 4.0 - 10.5 K/uL   RBC 3.47 (L) 3.87 - 5.11 MIL/uL   Hemoglobin 10.1 (L) 12.0 - 15.0 g/dL   HCT 30.6 (L) 36.0 - 46.0 %    MCV 88.2 80.0 - 100.0 fL   MCH 29.1 26.0 - 34.0 pg   MCHC 33.0 30.0 - 36.0 g/dL   RDW 13.6 11.5 - 15.5 %   Platelets 519 (H) 150 - 400 K/uL   nRBC 0.0 0.0 - 0.2 %    Comment: Performed at [facility], [address] [place], [place] [address]  Magnesium     Status: None   Collection Time: 06/19/21  5:54 AM  Result Value Ref Range   Magnesium 2.3 1.7 - 2.4 mg/dL    Comment: Performed at [facility], [address], [place],  [address]  Glucose, capillary     Status: Abnormal   Collection Time: 06/19/21  8:05 AM  Result Value Ref Range   Glucose-Capillary 108 (H) 70 - 99 mg/dL    Comment: Glucose reference range applies only to samples taken after fasting for at least 8 hours.  Glucose, capillary     Status: Abnormal   Collection Time: 06/19/21 11:59 AM  Result Value Ref Range   Glucose-Capillary 137 (H) 70 - 99 mg/dL    Comment: Glucose reference range applies only to samples taken after fasting for at least 8 hours.  Glucose, capillary     Status: Abnormal   Collection Time: 06/19/21  4:27 PM  Result Value Ref Range   Glucose-Capillary 115 (H) 70 - 99 mg/dL    Comment: Glucose reference range applies only to samples taken after fasting for at least 8 hours.    Studies/Results:  HEAD NECK CTA FINDINGS: CTA NECK FINDINGS   Aortic arch: Visualized aortic arch normal in caliber with normal 3 vessel morphology. No stenosis or other abnormality about the origin of the great vessels.   Right carotid system: Right common and internal carotid arteries patent without stenosis, dissection or occlusion. Mild for age atheromatous change about the  right bifurcation without stenosis.   Left carotid system: Left common and internal carotid arteries patent without stenosis, dissection or occlusion. Minimal for age atheromatous change about the left bifurcation without stenosis.   Vertebral arteries: Both vertebral arteries arise from the subclavian arteries. No proximal  subclavian artery stenosis. Both vertebral arteries widely patent without stenosis, dissection or occlusion.   Skeleton: No significant spondylosis for age. No discrete or worrisome osseous lesions.   Other neck: No other acute soft tissue abnormality within the neck. Few small right thyroid nodules noted, largest of which measures 6 mm, of doubtful significance given size and patient age, no follow-up imaging recommended (ref: J Am Coll Radiol. 2015 Feb;12(2): 143-50).No other mass or adenopathy.   Upper chest: Visualized upper chest demonstrates no acute finding. Partially visualized lungs are clear.   Review of the MIP images confirms the above findings   CTA HEAD FINDINGS   Anterior circulation: Both internal carotid arteries widely patent to the termini without stenosis or other abnormality. A1 segments widely patent. 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 No M1 stenosis or occlusion. Normal MCA bifurcations. Distal MCA branches well perfused and symmetric.   Posterior circulation: Vertebral arteries largely code dominant and widely patent to the vertebrobasilar junction. Neither PICA origin well visualized. Basilar widely patent. Superior cerebral arteries patent bilaterally. Both PCAs primarily supplied via the basilar well perfused to their distal aspects.   Venous sinuses: Patent.   Anatomic variants: Azygous ACA.  No aneurysm.   Review of the MIP images confirms the above findings   IMPRESSION: 1. Negative CTA of the head and neck. No large vessel occlusion, hemodynamically significant stenosis, or other acute vascular abnormality. 2. Azygous ACA.    BRAIN MRI FINDINGS: Brain: Acute infarct of the left cerebral peduncle of the midbrain. Mild associated edema without mass effect. No evidence of acute hemorrhage, hydrocephalus, midline shift encephalomalacia in the right eccentric body of the corpus callosum, most likely from prior infarct.  Additional mild scattered supratentorial T2 white matter hyperintensities and moderate pontine white matter T2 hyperintensity, nonspecific but most likely related to chronic microvascular ischemic disease given patient risk factors (including diabetes and hypertension). No acute hemorrhage, hydrocephalus, mass lesion, midline shift or extra-axial fluid collection.   Vascular: Major arterial flow voids are maintained at the skull base.   Skull and upper cervical spine: No focal marrow replacing lesion. Mild T1 hypointensity of the cervical marrow, nonspecific but most likely related to the patient's anemia.   Sinuses/Orbits: Mild paranasal sinus mucosal thickening. No acute orbital findings.   Other: Mastoid effusions.   IMPRESSION: 1. 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 Mild associated edema without mass effect. 2. 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 Sequela of prior demyelination is thought less likely.    Brain MRI is reviewed in person. There is increased signal seen on FLAIR imaging involving the left midbrain extending into the cerebral peduncle. There is mild global atrophy for age. There is also mild the periventricular white matter signal on FLAIR imaging along with mild pontine signal. Both point to likely microscopic ischemic changes. There also couple of white matter lesions seen FLAIR imaging. No hemorrhage.    TTE   1. Left ventricular ejection fraction, by estimation, is 60 to 65%. The  left ventricle has normal function. The left ventricle has no regional  wall motion abnormalities. Left ventricular diastolic parameters were  normal.   2. Right ventricular  systolic function is normal. The right ventricular  size is normal.   3. The mitral valve is abnormal. No evidence of mitral valve  regurgitation. No evidence of mitral stenosis.   4. The aortic valve was not well visualized.  There is mild calcification  of the aortic valve. Aortic valve regurgitation is not visualized. Mild  aortic valve sclerosis is present, with no evidence of aortic valve  stenosis.   5. The inferior vena cava is normal in size with greater than 50%  respiratory variability, suggesting right atrial pressure of 3 mmHg.       Lori Pytel A. Merlene Fleming, M.D.  Diplomate, Tax adviser of Psychiatry and Neurology ( Neurology). 06/19/2021, 5:28 PM

## 2021-06-19 NOTE — Progress Notes (Signed)
SLP Cancellation Note  Patient Details Name: Koreena Nogle MRN: [id number] DOB: Sep 07, 1971   Cancelled treatment:       Reason Eval/Treat Not Completed: Other (comment) (Pt passed [facility] and RN reports Pt consuming po without incident. SLP will sign off.)   Thank you,  Genene Churn, [place]  [place] 06/19/2021, 11:23 AM

## 2021-06-19 NOTE — Plan of Care (Signed)
  Problem: Acute Rehab OT Goals (only OT should resolve) Goal: Pt. Will Perform Eating Flowsheets (Taken 06/19/2021 0935) Pt Will Perform Eating:  Independently  sitting Note: Using RUE as dominant Goal: Pt. Will Perform Grooming Flowsheets (Taken 06/19/2021 0935) Pt Will Perform Grooming:  with modified independence  standing Note: Incorporating RUE and working towards use as dominant Goal: Pt. Will Transfer To Toilet Flowsheets (Taken 06/19/2021 0935) Pt Will Transfer to Toilet:  with modified independence  ambulating Goal: Pt. Will Perform Toileting-Clothing Manipulation Flowsheets (Taken 06/19/2021 0935) Pt Will Perform Toileting - Clothing Manipulation and hygiene:  with modified independence  sitting/lateral leans  sit to/from stand Goal: Pt/Caregiver Will Perform Home Exercise Program Flowsheets (Taken 06/19/2021 0935) Pt/caregiver will Perform Home Exercise Program:  Increased strength  Right Upper extremity  Independently  With written HEP provided

## 2021-06-19 NOTE — Evaluation (Signed)
Occupational Therapy Evaluation Patient Details Name: Lori Fleming MRN: [id number] DOB: [birth year] Today's Date: 06/19/2021   History of Present Illness Pt has been having RT sided weakness with hx of falling since 9/21. MRI pending   Clinical Impression   Pt agreeable to OT evaluation, reports she is unsure if they are transferring her to [facility] or where they will complete the MRI. Pt reports improvement in right hand functioning, on evaluation pt demonstrating decreased strength and coordination in RUE, increased time required for task completion. Recommend outpatient OT services on discharge.       Recommendations for follow up therapy are one component of a multi-disciplinary discharge planning process, led by the attending physician.  Recommendations may be updated based on patient status, additional functional criteria and insurance authorization.   Follow Up Recommendations  Outpatient OT    Equipment Recommendations  None recommended by OT       Precautions / Restrictions Precautions Precautions: Fall Restrictions Weight Bearing Restrictions: No      Mobility Bed Mobility Overal bed mobility: Independent                  Transfers Overall transfer level: Independent Equipment used: None                      ADL either performed or assessed with clinical judgement   ADL Overall ADL's : Needs assistance/impaired Eating/Feeding: Modified independent;Sitting Eating/Feeding Details (indicate cue type and reason): pt using non-dominant left hand for feeding due to decreased fine motor coordination in right hand Grooming: Wash/dry hands;Wash/dry face;Modified independent;Standing Grooming Details (indicate cue type and reason): increased time                 Toilet Transfer: Min guard;Ambulation   Toileting- Clothing Manipulation and Hygiene: Supervision/safety;Sitting/lateral lean;Sit to/from stand         General ADL Comments: Increased  time for task completion, supervision/min-guard for safety     Vision Baseline Vision/History: 0 No visual deficits Ability to See in Adequate Light: 0 Adequate Patient Visual Report: No change from baseline Vision Assessment?: No apparent visual deficits            Pertinent Vitals/Pain Pain Assessment: No/denies pain     Hand Dominance Right   Extremity/Trunk Assessment Upper Extremity Assessment Upper Extremity Assessment: RUE deficits/detail RUE Deficits / Details: Strength is 4/5 throughout (5/5 in LUE). Decreased grip strength. Mild Dysdiadochokinesia noted RUE Sensation: WNL RUE Coordination: decreased fine motor;decreased gross motor   Lower Extremity Assessment Lower Extremity Assessment: Defer to PT evaluation   Cervical / Trunk Assessment Cervical / Trunk Assessment: Normal   Communication Communication Communication: No difficulties   Cognition Arousal/Alertness: Awake/alert Behavior During Therapy: WFL for tasks assessed/performed Overall Cognitive Status: Within Functional Limits for tasks assessed                                                Home Living Family/patient expects to be discharged to:: Private residence Living Arrangements: Spouse/significant other Available Help at Discharge: Family;Available PRN/intermittently Type of Home: House Home Access: Level entry     Home Layout: One level     Bathroom Shower/Tub: Teacher, early years/pre: Standard     Home Equipment: None          Prior Functioning/Environment Level of Independence: Independent  Comments: independent in ADLs, functional mobility. Works from home for [employer]        OT Problem List: Decreased strength;Decreased activity tolerance;Impaired balance (sitting and/or standing);Decreased coordination;Decreased safety awareness;Impaired UE functional use      OT Treatment/Interventions: Self-care/ADL training;Therapeutic  exercise;Neuromuscular education;Therapeutic activities;Patient/family education    OT Goals(Current goals can be found in the care plan section) Acute Rehab OT Goals Patient Stated Goal: To go home OT Goal Formulation: With patient Time For Goal Achievement: 07/04/21 Potential to Achieve Goals: Good  OT Frequency: Min 1X/week                  AM-PAC OT "6 Clicks" Daily Activity     Outcome Measure Help from another person eating meals?: None Help from another person taking care of personal grooming?: None Help from another person toileting, which includes using toliet, bedpan, or urinal?: A Little Help from another person bathing (including washing, rinsing, drying)?: A Little Help from another person to put on and taking off regular upper body clothing?: None Help from another person to put on and taking off regular lower body clothing?: None 6 Click Score: 22   End of Session    Activity Tolerance: Patient tolerated treatment well Patient left: in bed;with call bell/phone within reach;with bed alarm set  OT Visit Diagnosis: Repeated falls (R29.6);Muscle weakness (generalized) (M62.81);Hemiplegia and hemiparesis Hemiplegia - Right/Left: Right Hemiplegia - dominant/non-dominant: Dominant Hemiplegia - caused by: Unspecified                Time: [id number] OT Time Calculation (min): 15 min Charges:  OT General Charges $OT Visit: 1 Visit OT Evaluation $OT Eval Low Complexity: [place], OTR/L  [phone] 06/19/2021, 9:33 AM

## 2021-06-19 NOTE — Progress Notes (Signed)
PROGRESS NOTE    Lori Fleming  [id number] DOB: 1971-07-05 DOA: 06/17/2021 PCP: Hoyt Koch, MD    Brief Narrative: Lori Fleming is a 50 year old female who presented with right arm weakness, gait instability. She reports an onset morning of 06/15/21. HX of htn, dm. She reports she awoke 0745, noted gait difficulties, slurred speech, and difficulty writing with her right arm. She reports the speech difficulties have resolved, but her speech still seems like she is searching for words. She has 4/5 grip, 4/5 flexion, 4/5 extension of the right arm. Intact sensation. Slight right arm drift with closed eyes. CN 2-12 intact.   Assessment & Plan:   Principal Problem:   Right sided weakness -initial ct head was without acute findings -head/neck angio was without acute findings -MRI planned for further investigation -Blood glucose was recorded within normal -No history of seizure or seizure like activity -further neurology evaluation -2d echocardiogram was without valvular regurgitation or stenosis, aortic valve view was difficult   Active Problems:     Essential hypertension -home amlodipine '5mg'$  held while investigating -home hyzaar held while investigating -pt reports she monitors her home bp, reports 'around 130 / 90'    Thrombocytosis -chronic, continue to monitor    Diabetes -pt metformin held -SSI    Normocytic anemia -stable, monitor cbc for any changes        DVT prophylaxis: scd's Code Status: full Family Communication:  Disposition Plan: evaluation with neurology and MRI to decide further, statin?   Consultants:  neurology    Subjective: Patient is in bed, only complaint is right arm weakness and walking difficulty  Objective: Vitals:   06/19/21 0415 06/19/21 0515 06/19/21 0915 06/19/21 1201  BP: (!) 139/92 139/84 130/72 138/89  Pulse:   92 90  Resp: '16 15 17 15  '$ Temp: 98.4 F (36.9 C) 98.7 F (37.1 C) 98.1 F (36.7 C) 98.3  F (36.8 C)  TempSrc: Oral Oral Oral Oral  SpO2: 97% 98% 99% 98%  Weight:      Height:        Intake/Output Summary (Last 24 hours) at 06/19/2021 1243 Last data filed at 06/19/2021 0900 Gross per 24 hour  Intake 120 ml  Output --  Net 120 ml   Filed Weights   06/17/21 1510 06/18/21 1714  Weight: 70.7 kg 68.9 kg    Examination:  General exam: Appears well, pink, warm, dry to the touch. Conversational  Respiratory system: Clear to auscultation. Respiratory effort normal.  Cardiovascular system: S1 & S2 heard, RRR. No JVD, murmurs, rubs, gallops or clicks. No pedal edema.  Gastrointestinal system: Abdomen is nondistended, soft and nontender. No organomegaly or masses felt. Normal bowel sounds heard.  Central nervous system: Alert and oriented. CN 2-12 grossly intact 4/5 Weakness in right arm flexion, extension, grip. Sensation intact  Extremities: moves all appropriately, pulses intact, sensation intact, no edema  Skin: No rashes, lesions or ulcers  Psychiatry: Judgement and insight appear normal. Mood & affect appropriate.     Data Reviewed: I have personally reviewed following labs and imaging studies  CBC: Recent Labs  Lab 06/17/21 1524 06/18/21 0559 06/19/21 0554  WBC 6.6 8.4 7.8  HGB 10.1* 9.6* 10.1*  HCT 30.4* 29.0* 30.6*  MCV 88.6 87.3 88.2  PLT 472* 485* [id number]*   Basic Metabolic Panel: Recent Labs  Lab 06/17/21 1524 06/18/21 0559 06/19/21 0554  NA 136 134* 134*  K 4.8 4.0 4.0  CL 105 104 103  CO2 24 23  21*  GLUCOSE 142* 114* 117*  BUN '17 14 18  '$ CREATININE 1.44* 1.13* 1.69*  CALCIUM 8.9 8.8* 8.8*  MG  --  2.2 2.3  PHOS  --  3.3  --    GFR: Estimated Creatinine Clearance: 37.7 mL/min (A) (by C-G formula based on SCr of 1.69 mg/dL (H)). Liver Function Tests: Recent Labs  Lab 06/18/21 0559  AST 11*  ALT 9  ALKPHOS 57  BILITOT 0.5  PROT 6.7  ALBUMIN 3.5   No results for input(s): LIPASE, AMYLASE in the last 168 hours. No results for  input(s): AMMONIA in the last 168 hours. Coagulation Profile: Recent Labs  Lab 06/18/21 0559  INR 1.0   Cardiac Enzymes: No results for input(s): CKTOTAL, CKMB, CKMBINDEX, TROPONINI in the last 168 hours. BNP (last 3 results) No results for input(s): PROBNP in the last 8760 hours. HbA1C: Recent Labs    06/18/21 0559  HGBA1C 7.2*   CBG: Recent Labs  Lab 06/18/21 2009 06/19/21 0039 06/19/21 0454 06/19/21 0805 06/19/21 1159  GLUCAP 91 104* 112* 108* 137*   Lipid Profile: Recent Labs    06/18/21 0559  CHOL 211*  HDL 38*  LDLCALC 151*  TRIG 110  CHOLHDL 5.6   Thyroid Function Tests: No results for input(s): TSH, T4TOTAL, FREET4, T3FREE, THYROIDAB in the last 72 hours. Anemia Panel: No results for input(s): VITAMINB12, FOLATE, FERRITIN, TIBC, IRON, RETICCTPCT in the last 72 hours. Urine analysis:    Component Value Date/Time   COLORURINE YELLOW 06/17/2021 1708   APPEARANCEUR HAZY (A) 06/17/2021 1708   LABSPEC 1.009 06/17/2021 1708   PHURINE 7.0 06/17/2021 1708   GLUCOSEU NEGATIVE 06/17/2021 1708   HGBUR SMALL (A) 06/17/2021 1708   BILIRUBINUR NEGATIVE 06/17/2021 1708   KETONESUR 5 (A) 06/17/2021 1708   PROTEINUR >=300 (A) 06/17/2021 1708   NITRITE NEGATIVE 06/17/2021 1708   LEUKOCYTESUR NEGATIVE 06/17/2021 1708   Sepsis Labs: '@LABRCNTIP'$ (procalcitonin:4,lacticidven:4)  ) Recent Results (from the past 240 hour(s))  Resp Panel by RT-PCR (Flu A&B, Covid) Nasopharyngeal Swab     Status: None   Collection Time: 06/17/21  8:20 PM   Specimen: Nasopharyngeal Swab; Nasopharyngeal(NP) swabs in vial transport medium  Result Value Ref Range Status   SARS Coronavirus 2 by RT PCR NEGATIVE NEGATIVE Final    Comment: (NOTE) SARS-CoV-2 target nucleic acids are NOT DETECTED.  The SARS-CoV-2 RNA is generally detectable in upper respiratory specimens during the acute phase of infection. The lowest concentration of SARS-CoV-2 viral copies this assay can detect is 138  copies/mL. A negative result does not preclude SARS-Cov-2 infection and should not be used as the sole basis for treatment or other patient management decisions. A negative result may occur with  improper specimen collection/handling, submission of specimen other than nasopharyngeal swab, presence of viral mutation(s) within the areas targeted by this assay, and inadequate number of viral copies(<138 copies/mL). A negative result must be combined with clinical observations, patient history, and epidemiological information. The expected result is Negative.  Fact Sheet for Patients:  EntrepreneurPulse.com.au  Fact Sheet for Healthcare Providers:  IncredibleEmployment.be  This test is no t yet approved or cleared by the [place] FDA and  has been authorized for detection and/or diagnosis of SARS-CoV-2 by FDA under an Emergency Use Authorization (EUA). This EUA will remain  in effect (meaning this test can be used) for the duration of the COVID-19 declaration under Section 564(b)(1) of the Act, 21 U.S.C.section 360bbb-3(b)(1), unless the authorization is terminated  or revoked sooner.  Influenza A by PCR NEGATIVE NEGATIVE Final   Influenza B by PCR NEGATIVE NEGATIVE Final    Comment: (NOTE) The Xpert Xpress SARS-CoV-2/FLU/RSV plus assay is intended as an aid in the diagnosis of influenza from Nasopharyngeal swab specimens and should not be used as a sole basis for treatment. Nasal washings and aspirates are unacceptable for Xpert Xpress SARS-CoV-2/FLU/RSV testing.  Fact Sheet for Patients: EntrepreneurPulse.com.au  Fact Sheet for Healthcare Providers: IncredibleEmployment.be  This test is not yet approved or cleared by the [place] FDA and has been authorized for detection and/or diagnosis of SARS-CoV-2 by FDA under an Emergency Use Authorization (EUA). This EUA will remain in effect (meaning  this test can be used) for the duration of the COVID-19 declaration under Section 564(b)(1) of the Act, 21 U.S.C. section 360bbb-3(b)(1), unless the authorization is terminated or revoked.  Performed at [facility], [address], [place], [place] [address]          Radiology Studies: CT ANGIO HEAD NECK W WO CM  Result Date: 06/18/2021 CLINICAL DATA:  Initial evaluation for neuro deficit, stroke suspected. EXAM: CT ANGIOGRAPHY HEAD AND NECK TECHNIQUE: Multidetector CT imaging of the head and neck was performed using the standard protocol during bolus administration of intravenous contrast. Multiplanar CT image reconstructions and MIPs were obtained to evaluate the vascular anatomy. Carotid stenosis measurements (when applicable) are obtained utilizing NASCET criteria, using the distal internal carotid diameter as the denominator. CONTRAST:  69m OMNIPAQUE IOHEXOL 350 MG/ML SOLN COMPARISON:  CT from 06/17/2021. FINDINGS: CTA NECK FINDINGS Aortic arch: Visualized aortic arch normal in caliber with normal 3 vessel morphology. No stenosis or other abnormality about the origin of the great vessels. Right carotid system: Right common and internal carotid arteries patent without stenosis, dissection or occlusion. Mild for age atheromatous change about the right bifurcation without stenosis. Left carotid system: Left common and internal carotid arteries patent without stenosis, dissection or occlusion. Minimal for age atheromatous change about the left bifurcation without stenosis. Vertebral arteries: Both vertebral arteries arise from the subclavian arteries. No proximal subclavian artery stenosis. Both vertebral arteries widely patent without stenosis, dissection or occlusion. Skeleton: No significant spondylosis for age. No discrete or worrisome osseous lesions. Other neck: No other acute soft tissue abnormality within the neck. Few small right thyroid nodules noted, largest of which measures 6 mm, of  doubtful significance given size and patient age, no follow-up imaging recommended (ref: J Am Coll Radiol. 2015 Feb;12(2): 143-50).No other mass or adenopathy. Upper chest: Visualized upper chest demonstrates no acute finding. Partially visualized lungs are clear. Review of the MIP images confirms the above findings CTA HEAD FINDINGS Anterior circulation: Both internal carotid arteries widely patent to the termini without stenosis or other abnormality. A1 segments widely patent. 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 No M1 stenosis or occlusion. Normal MCA bifurcations. Distal MCA branches well perfused and symmetric. Posterior circulation: Vertebral arteries largely code dominant and widely patent to the vertebrobasilar junction. Neither PICA origin well visualized. Basilar widely patent. Superior cerebral arteries patent bilaterally. Both PCAs primarily supplied via the basilar well perfused to their distal aspects. Venous sinuses: Patent. Anatomic variants: Azygous ACA.  No aneurysm. Review of the MIP images confirms the above findings IMPRESSION: 1. Negative CTA of the head and neck. No large vessel occlusion, hemodynamically significant stenosis, or other acute vascular abnormality. 2. Azygous ACA. Electronically Signed   By: BJeannine BogaM.D.   On: 06/18/2021 01:44   CT HEAD WO CONTRAST (  5MM)  Result Date: 06/17/2021 CLINICAL DATA:  Cerebral hemorrhage suspected. Slurred speech, right-sided weakness, multiple falls. EXAM: CT HEAD WITHOUT CONTRAST TECHNIQUE: Contiguous axial images were obtained from the base of the skull through the vertex without intravenous contrast. COMPARISON:  None. FINDINGS: Brain: Normal anatomic configuration. No abnormal intra or extra-axial mass lesion or fluid collection. No abnormal mass effect or midline shift. No evidence of acute intracranial hemorrhage or infarct. Ventricular size is normal. Cerebellum unremarkable. Vascular: No asymmetric  hyperdense vasculature at the skull base. Mild atherosclerotic calcification within the carotid siphons bilaterally. Skull: Intact Sinuses/Orbits: Paranasal sinuses are clear. Orbits are unremarkable. Other: Mastoid air cells and middle ear cavities are clear. IMPRESSION: No acute intracranial abnormality. Electronically Signed   By: Fidela Salisbury M.D.   On: 06/17/2021 20:16   MR BRAIN WO CONTRAST  Result Date: 06/19/2021 CLINICAL DATA:  Neuro deficit, acute, stroke suspected EXAM: MRI HEAD WITHOUT CONTRAST TECHNIQUE: Multiplanar, multiecho pulse sequences of the brain and surrounding structures were obtained without intravenous contrast. COMPARISON:  CT from 06/18/2021. 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 Mild associated edema without mass effect. No evidence of acute hemorrhage, hydrocephalus, midline shift encephalomalacia in the right eccentric body of the corpus callosum, most likely from prior infarct. Additional mild scattered supratentorial T2 white matter hyperintensities and moderate pontine white matter T2 hyperintensity, nonspecific but most likely related to chronic microvascular ischemic disease given patient risk factors (including diabetes and hypertension). No acute hemorrhage, hydrocephalus, mass lesion, midline shift or extra-axial fluid collection. Vascular: Major arterial flow voids are maintained at the skull base. Skull and upper cervical spine: No focal marrow replacing lesion. Mild T1 hypointensity of the cervical marrow, nonspecific but most likely related to the patient's anemia. Sinuses/Orbits: Mild paranasal sinus mucosal thickening. No acute orbital findings. Other: Mastoid effusions. IMPRESSION: 1. 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 Mild associated edema without mass effect. 2. 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 Sequela of prior demyelination is thought less  likely. Electronically Signed   By: Margaretha Sheffield M.D.   On: 06/19/2021 11:37   ECHOCARDIOGRAM COMPLETE  Result Date: 06/18/2021    ECHOCARDIOGRAM REPORT   Patient Name:   JOLENE KURAS Date of Exam: 06/18/2021 Medical Rec #:  [id number]       Height:       66.0 in Accession #:    [id number]      Weight:       155.8 lb Date of Birth:  Apr 08, 1971      BSA:          1.798 m Patient Age:    73 years        BP:           137/82 mmHg Patient Gender: F               HR:           88 bpm. Exam Location:  [facility] Procedure: 2D Echo, Cardiac Doppler and Color Doppler Indications:    CVA  History:        Patient has no prior history of Echocardiogram examinations.                 Risk Factors:Hypertension and Diabetes.  Sonographer:    Dustin Flock RDCS Referring Phys: [id number] OLADAPO ADEFESO IMPRESSIONS  1. Left ventricular ejection fraction, by estimation, is 60 to 65%. The left ventricle has normal  function. The left ventricle has no regional wall motion abnormalities. Left ventricular diastolic parameters were normal.  2. Right ventricular systolic function is normal. The right ventricular size is normal.  3. The mitral valve is abnormal. No evidence of mitral valve regurgitation. No evidence of mitral stenosis.  4. The aortic valve was not well visualized. There is mild calcification of the aortic valve. Aortic valve regurgitation is not visualized. Mild aortic valve sclerosis is present, with no evidence of aortic valve stenosis.  5. The inferior vena cava is normal in size with greater than 50% respiratory variability, suggesting right atrial pressure of 3 mmHg. FINDINGS  Left Ventricle: Left ventricular ejection fraction, by estimation, is 60 to 65%. The left ventricle has normal function. The left ventricle has no regional wall motion abnormalities. The left ventricular internal cavity size was normal in size. There is  no left ventricular hypertrophy. Left ventricular diastolic parameters were  normal. Right Ventricle: The right ventricular size is normal. No increase in right ventricular wall thickness. Right ventricular systolic function is normal. Left Atrium: Left atrial size was normal in size. Right Atrium: Right atrial size was normal in size. Pericardium: There is no evidence of pericardial effusion. Mitral Valve: The mitral valve is abnormal. There is mild thickening of the mitral valve leaflet(s). There is mild calcification of the mitral valve leaflet(s). Mild mitral annular calcification. No evidence of mitral valve regurgitation. No evidence of mitral valve stenosis. Tricuspid Valve: The tricuspid valve is normal in structure. Tricuspid valve regurgitation is not demonstrated. No evidence of tricuspid stenosis. Aortic Valve: The aortic valve was not well visualized. There is mild calcification of the aortic valve. Aortic valve regurgitation is not visualized. Mild aortic valve sclerosis is present, with no evidence of aortic valve stenosis. Pulmonic Valve: The pulmonic valve was normal in structure. Pulmonic valve regurgitation is not visualized. No evidence of pulmonic stenosis. Aorta: The aortic root is normal in size and structure. Venous: The inferior vena cava is normal in size with greater than 50% respiratory variability, suggesting right atrial pressure of 3 mmHg. IAS/Shunts: No atrial level shunt detected by color flow Doppler.  LEFT VENTRICLE PLAX 2D LVIDd:         4.10 cm     Diastology LVIDs:         2.50 cm     LV e' medial:    5.13 cm/s LV PW:         1.10 cm     LV E/e' medial:  11.8 LV IVS:        1.10 cm     LV e' lateral:   10.60 cm/s LVOT diam:     2.00 cm     LV E/e' lateral: 5.7 LV SV:         64 LV SV Index:   36 LVOT Area:     3.14 cm  LV Volumes (MOD) LV vol d, MOD A4C: 81.4 ml LV vol s, MOD A4C: 23.7 ml LV SV MOD A4C:     81.4 ml RIGHT VENTRICLE RV Basal diam:  2.80 cm RV S prime:     14.00 cm/s TAPSE (M-mode): 2.0 cm LEFT ATRIUM             Index       RIGHT ATRIUM           Index LA diam:        3.60 cm 2.00 cm/m  RA Area:     6.34 cm LA  Vol [facility]   21.7 ml 12.07 ml/m RA Volume:   9.74 ml  5.42 ml/m LA Vol (A4C):   23.3 ml 12.96 ml/m LA Biplane Vol: 24.7 ml 13.73 ml/m  AORTIC VALVE LVOT Vmax:   113.00 cm/s LVOT Vmean:  87.000 cm/s LVOT VTI:    0.204 m  AORTA Ao Root diam: 2.50 cm MITRAL VALVE MV Area (PHT): 5.46 cm    SHUNTS MV Decel Time: 139 msec    Systemic VTI:  0.20 m MV E velocity: 60.70 cm/s  Systemic Diam: 2.00 cm MV A velocity: 72.20 cm/s MV E/A ratio:  0.84 Jenkins Rouge MD Electronically signed by Jenkins Rouge MD Signature Date/Time: 06/18/2021/1:50:34 PM    Final         Scheduled Meds:  aspirin EC  81 mg Oral Daily   insulin aspart  0-9 Units Subcutaneous Q4H   simvastatin  40 mg Oral q1800   Continuous Infusions:  sodium chloride 100 mL/hr at 06/19/21 0852     LOS: 1 day       Ellie Lunch, student [facility] Pager [phone]  If 7PM-7AM, please contact night-coverage [email or web address] Password [facility] 06/19/2021, 12:43 PM

## 2021-06-19 NOTE — Plan of Care (Signed)
  Problem: Education: Goal: Knowledge of General Education information will improve Description: Including pain rating scale, medication(s)/side effects and non-pharmacologic comfort measures Outcome: Progressing   Problem: Health Behavior/Discharge Planning: Goal: Ability to manage health-related needs will improve Outcome: Progressing   Problem: Safety: Goal: Ability to remain free from injury will improve Outcome: Progressing   Problem: Education: Goal: Knowledge of disease or condition will improve Outcome: Progressing Goal: Knowledge of secondary prevention will improve Outcome: Progressing

## 2021-06-20 LAB — BASIC METABOLIC PANEL
Anion gap: 7 (ref 5–15)
BUN: 17 mg/dL (ref 6–20)
CO2: 24 mmol/L (ref 22–32)
Calcium: 8.3 mg/dL — ABNORMAL LOW (ref 8.9–10.3)
Chloride: 107 mmol/L (ref 98–111)
Creatinine, Ser: 1.35 mg/dL — ABNORMAL HIGH (ref 0.44–1.00)
GFR, Estimated: 48 mL/min — ABNORMAL LOW (ref >60)
Glucose, Bld: 111 mg/dL — ABNORMAL HIGH (ref 70–99)
Potassium: 3.9 mmol/L (ref 3.5–5.1)
Sodium: 138 mmol/L (ref 135–145)

## 2021-06-20 LAB — VITAMIN B12: Vitamin B-12: 501 pg/mL (ref 180–914)

## 2021-06-20 LAB — GLUCOSE, CAPILLARY
Glucose-Capillary: 100 mg/dL — ABNORMAL HIGH (ref 70–99)
Glucose-Capillary: 104 mg/dL — ABNORMAL HIGH (ref 70–99)
Glucose-Capillary: 106 mg/dL — ABNORMAL HIGH (ref 70–99)

## 2021-06-20 LAB — CBC
HCT: 28.9 % — ABNORMAL LOW (ref 36.0–46.0)
Hemoglobin: 9.3 g/dL — ABNORMAL LOW (ref 12.0–15.0)
MCH: 28.5 pg (ref 26.0–34.0)
MCHC: 32.2 g/dL (ref 30.0–36.0)
MCV: 88.7 fL (ref 80.0–100.0)
Platelets: 455 10*3/uL — ABNORMAL HIGH (ref 150–400)
RBC: 3.26 MIL/uL — ABNORMAL LOW (ref 3.87–5.11)
RDW: 13.4 % (ref 11.5–15.5)
WBC: 6.3 10*3/uL (ref 4.0–10.5)
nRBC: 0 % (ref 0.0–0.2)

## 2021-06-20 LAB — MAGNESIUM: Magnesium: 2.2 mg/dL (ref 1.7–2.4)

## 2021-06-20 LAB — RPR: RPR Ser Ql: NONREACTIVE

## 2021-06-20 LAB — TSH: TSH: 1.228 u[IU]/mL (ref 0.350–4.500)

## 2021-06-20 MED ORDER — ASPIRIN 325 MG PO TBEC: 325.0000 mg | DELAYED_RELEASE_TABLET | Freq: Every day | ORAL | 3 refills | Status: DC

## 2021-06-20 MED ORDER — CLOPIDOGREL BISULFATE 75 MG PO TABS: 75.0000 mg | ORAL_TABLET | Freq: Every day | ORAL | 0 refills | Status: AC

## 2021-06-20 MED ORDER — SIMVASTATIN 40 MG PO TABS: 40.0000 mg | ORAL_TABLET | Freq: Every day | ORAL | 3 refills | Status: DC

## 2021-06-20 MED ORDER — CLOPIDOGREL BISULFATE 75 MG PO TABS
75.0000 mg | ORAL_TABLET | Freq: Every day | ORAL | Status: DC
  Administered 2021-06-20: 75 mg via ORAL
  Filled 2021-06-20: qty 1

## 2021-06-20 NOTE — Progress Notes (Signed)
Discharge instructions provided to patient. Patient verbalized understanding of discharge instructions. Patient aware of follow-up with PT tomorrow 06/21/21. Patient asking for work note, otherwise no further questions.

## 2021-06-20 NOTE — Discharge Summary (Signed)
Physician Discharge Summary  Lori Fleming [id number] DOB: 06-07-1971 DOA: 06/17/2021  PCP: Hoyt Koch, MD  Admit date: 06/17/2021  Discharge date: 06/20/2021  Admitted From:Home  Disposition:  Home  Recommendations for Outpatient Follow-up:  Follow up with PCP in 1-2 weeks, follow-up BMP in 1 week Continue on aspirin and Plavix as prescribed for 3 weeks and then aspirin 325 mg daily thereafter Continue on statin as prescribed Follow-up with Dr. Merlene Laughter with neurology in 4 weeks as recommended Continue other home medications as prior  Home Health: Outpatient PT/OT  Equipment/Devices: None  Discharge Condition:Stable  CODE STATUS: Full  Diet recommendation: Heart Healthy/carb modified  Brief/Interim Summary:  Lori Fleming is a 50 y.o. female with medical history significant for essential HTN and type 2 diabetes mellitus who presents to the emergency department due to 2-day onset of weakness on the right and gait difficulty.  She was admitted for TIA/CVA evaluation and initial plans were to transfer to [facility] over the course of the weekend due to lack of MRI and neurology coverage, however there was no bed availability and patient stayed at [facility].  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  She underwent PT/OT evaluation with recommendations for outpatient therapy.  Neurology recommends dual antiplatelet therapy for 3 weeks and then full dose aspirin thereafter as well as continuation of statin.  She was noted to have AKI during the course of this admission as well due to some mild dehydration which has now been improving since IV fluid hydration.  She will need close follow-up with her PCP to recheck BMP in approximately 1 week.  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  Discharge Instructions  Discharge Instructions     Ambulatory referral to Physical Therapy   Complete by: As directed    Diet - low sodium heart healthy   Complete by: As directed    Increase activity slowly   Complete by: As directed       Allergies as of 06/20/2021   No Known Allergies      Medication List     TAKE these medications    amLODipine 5 MG tablet Commonly known as: NORVASC Take 1 tablet (5 mg total) by mouth daily.   aspirin 325 MG EC tablet Take 1 tablet (325 mg total) by mouth daily.   clopidogrel 75 MG tablet Commonly known as: PLAVIX Take 1 tablet (75 mg total) by mouth daily with breakfast for 21 days.   losartan-hydrochlorothiazide 100-25 MG tablet Commonly known as: HYZAAR TAKE 1 TABLET BY MOUTH EVERY DAY   metFORMIN 750 MG 24 hr tablet Commonly known as: GLUCOPHAGE-XR TAKE 1 TABLET (750 MG TOTAL) BY MOUTH 2 (TWO) TIMES DAILY WITH A MEAL.   simvastatin 40 MG tablet Commonly known as: ZOCOR Take 1 tablet (40 mg total) by mouth daily at 6 PM.   WOMENS MULTI VITAMIN & MINERAL PO Take by mouth.        Follow-up Information     [facility].   Why: Staff from the outpatient therapy clinic will call you to schedule appointments Contact information: [place]  [phone]        Hoyt Koch, MD. Schedule an appointment as soon as possible for a visit in 1 week(s).   Specialty: Internal Medicine Contact information: [place] [address] [phone]         Phillips Odor, MD. Schedule an appointment as soon as possible for a visit in 4 week(s).   Specialty: Neurology Contact information: [place] [address] [phone]                 No Known Allergies  Consultations: Neurology   Procedures/Studies: CT ANGIO HEAD NECK W WO CM  Result Date: 06/18/2021 CLINICAL DATA:  Initial evaluation for neuro deficit, stroke suspected. EXAM: CT ANGIOGRAPHY HEAD AND NECK TECHNIQUE: Multidetector CT imaging of the head and neck was performed using the standard protocol during bolus administration of intravenous contrast. Multiplanar CT image reconstructions and MIPs were obtained to evaluate the vascular anatomy. Carotid stenosis measurements (when applicable) are obtained utilizing NASCET criteria, using the distal internal carotid diameter as the denominator. CONTRAST:  78m OMNIPAQUE IOHEXOL 350 MG/ML SOLN COMPARISON:  CT from 06/17/2021. FINDINGS: CTA NECK FINDINGS Aortic arch: Visualized aortic arch normal in caliber with normal 3 vessel morphology. No stenosis or other abnormality about the origin of the great vessels. Right carotid system: Right common and internal carotid arteries patent without stenosis, dissection or occlusion. Mild for age atheromatous change about the right bifurcation without stenosis. Left carotid system: Left common and internal carotid arteries patent without stenosis, dissection or occlusion. Minimal for age atheromatous change about the left bifurcation without stenosis. Vertebral arteries: Both vertebral arteries arise from the subclavian arteries. No proximal subclavian artery stenosis. Both vertebral arteries widely patent without stenosis, dissection or occlusion. Skeleton: No significant spondylosis for age. No discrete or worrisome osseous lesions. Other neck: No other acute soft tissue abnormality within the neck. Few small right thyroid nodules noted, largest of which measures 6 mm, of doubtful significance given size and patient age, no follow-up imaging recommended (ref: J Am Coll Radiol. 2015 Feb;12(2): 143-50).No other mass or adenopathy. Upper chest: Visualized upper chest demonstrates no acute  finding. Partially visualized lungs are clear. Review of the MIP images confirms the above findings CTA HEAD FINDINGS Anterior circulation: Both internal carotid arteries widely patent to the termini without stenosis or other abnormality. A1 segments widely patent. 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 No M1 stenosis or occlusion. Normal MCA bifurcations. Distal MCA branches well perfused and symmetric. Posterior circulation: Vertebral arteries largely code dominant and widely patent to the vertebrobasilar junction. Neither PICA origin well visualized. Basilar widely patent. Superior cerebral arteries patent bilaterally. Both PCAs primarily supplied via the basilar well perfused to their distal aspects. Venous sinuses: Patent. Anatomic variants: Azygous ACA.  No aneurysm. Review of the MIP images confirms the above findings IMPRESSION: 1. Negative CTA of the head and neck. No large vessel occlusion, hemodynamically significant stenosis, or other acute vascular abnormality. 2. Azygous ACA. Electronically Signed   By: BJeannine BogaM.D.   On: 06/18/2021 01:44   CT HEAD WO CONTRAST (5MM)  Result Date: 06/17/2021 CLINICAL DATA:  Cerebral hemorrhage suspected. Slurred speech, right-sided weakness, multiple falls. EXAM: CT HEAD WITHOUT CONTRAST TECHNIQUE: Contiguous axial images were obtained from the base of the skull through the vertex without intravenous contrast. COMPARISON:  None. FINDINGS: Brain: Normal anatomic configuration. No abnormal intra or extra-axial mass lesion or fluid collection. No abnormal mass effect or midline shift. No evidence  of acute intracranial hemorrhage or infarct. Ventricular size is normal. Cerebellum unremarkable. Vascular: No asymmetric hyperdense vasculature at the skull base. Mild atherosclerotic calcification within the carotid siphons bilaterally. Skull: Intact Sinuses/Orbits: Paranasal sinuses are clear. Orbits are unremarkable. Other: Mastoid air cells  and middle ear cavities are clear. IMPRESSION: No acute intracranial abnormality. Electronically Signed   By: Fidela Salisbury M.D.   On: 06/17/2021 20:16   MR BRAIN WO CONTRAST  Result Date: 06/19/2021 CLINICAL DATA:  Neuro deficit, acute, stroke suspected EXAM: MRI HEAD WITHOUT CONTRAST TECHNIQUE: Multiplanar, multiecho pulse sequences of the brain and surrounding structures were obtained without intravenous contrast. COMPARISON:  CT from 06/18/2021. 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 Mild associated edema without mass effect. No evidence of acute hemorrhage, hydrocephalus, midline shift encephalomalacia in the right eccentric body of the corpus callosum, most likely from prior infarct. Additional mild scattered supratentorial T2 white matter hyperintensities and moderate pontine white matter T2 hyperintensity, nonspecific but most likely related to chronic microvascular ischemic disease given patient risk factors (including diabetes and hypertension). No acute hemorrhage, hydrocephalus, mass lesion, midline shift or extra-axial fluid collection. Vascular: Major arterial flow voids are maintained at the skull base. Skull and upper cervical spine: No focal marrow replacing lesion. Mild T1 hypointensity of the cervical marrow, nonspecific but most likely related to the patient's anemia. Sinuses/Orbits: Mild paranasal sinus mucosal thickening. No acute orbital findings. Other: Mastoid effusions. IMPRESSION: 1. 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 Mild associated edema without mass effect. 2. 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 Sequela of prior demyelination is thought less likely. Electronically Signed   By: Margaretha Sheffield M.D.   On: 06/19/2021 11:37   ECHOCARDIOGRAM COMPLETE  Result Date: 06/18/2021    ECHOCARDIOGRAM REPORT   Patient Name:   Lori Fleming Date of Exam: 06/18/2021  Medical Rec #:  [id number]       Height:       66.0 in Accession #:    [id number]      Weight:       155.8 lb Date of Birth:  05-25-1971      BSA:          1.798 m Patient Age:    65 years        BP:           137/82 mmHg Patient Gender: F               HR:           88 bpm. Exam Location:  [facility] Procedure: 2D Echo, Cardiac Doppler and Color Doppler Indications:    CVA  History:        Patient has no prior history of Echocardiogram examinations.                 Risk Factors:Hypertension and Diabetes.  Sonographer:    Dustin Flock RDCS Referring Phys: [id number] OLADAPO ADEFESO IMPRESSIONS  1. Left ventricular ejection fraction, by estimation, is 60 to 65%. The left ventricle has normal function. The left ventricle has no regional wall motion abnormalities. Left ventricular diastolic parameters were normal.  2. Right ventricular systolic function is normal. The right ventricular size is normal.  3. The mitral valve is abnormal. No evidence of mitral valve regurgitation. No evidence of mitral stenosis.  4. The aortic valve was not well visualized. There is mild calcification of the aortic valve. Aortic valve  regurgitation is not visualized. Mild aortic valve sclerosis is present, with no evidence of aortic valve stenosis.  5. The inferior vena cava is normal in size with greater than 50% respiratory variability, suggesting right atrial pressure of 3 mmHg. FINDINGS  Left Ventricle: Left ventricular ejection fraction, by estimation, is 60 to 65%. The left ventricle has normal function. The left ventricle has no regional wall motion abnormalities. The left ventricular internal cavity size was normal in size. There is  no left ventricular hypertrophy. Left ventricular diastolic parameters were normal. Right Ventricle: The right ventricular size is normal. No increase in right ventricular wall thickness. Right ventricular systolic function is normal. Left Atrium: Left atrial size was normal in size. Right Atrium:  Right atrial size was normal in size. Pericardium: There is no evidence of pericardial effusion. Mitral Valve: The mitral valve is abnormal. There is mild thickening of the mitral valve leaflet(s). There is mild calcification of the mitral valve leaflet(s). Mild mitral annular calcification. No evidence of mitral valve regurgitation. No evidence of mitral valve stenosis. Tricuspid Valve: The tricuspid valve is normal in structure. Tricuspid valve regurgitation is not demonstrated. No evidence of tricuspid stenosis. Aortic Valve: The aortic valve was not well visualized. There is mild calcification of the aortic valve. Aortic valve regurgitation is not visualized. Mild aortic valve sclerosis is present, with no evidence of aortic valve stenosis. Pulmonic Valve: The pulmonic valve was normal in structure. Pulmonic valve regurgitation is not visualized. No evidence of pulmonic stenosis. Aorta: The aortic root is normal in size and structure. Venous: The inferior vena cava is normal in size with greater than 50% respiratory variability, suggesting right atrial pressure of 3 mmHg. IAS/Shunts: No atrial level shunt detected by color flow Doppler.  LEFT VENTRICLE PLAX 2D LVIDd:         4.10 cm     Diastology LVIDs:         2.50 cm     LV e' medial:    5.13 cm/s LV PW:         1.10 cm     LV E/e' medial:  11.8 LV IVS:        1.10 cm     LV e' lateral:   10.60 cm/s LVOT diam:     2.00 cm     LV E/e' lateral: 5.7 LV SV:         64 LV SV Index:   36 LVOT Area:     3.14 cm  LV Volumes (MOD) LV vol d, MOD A4C: 81.4 ml LV vol s, MOD A4C: 23.7 ml LV SV MOD A4C:     81.4 ml RIGHT VENTRICLE RV Basal diam:  2.80 cm RV S prime:     14.00 cm/s TAPSE (M-mode): 2.0 cm LEFT ATRIUM             Index       RIGHT ATRIUM          Index LA diam:        3.60 cm 2.00 cm/m  RA Area:     6.34 cm LA Vol (A2C):   21.7 ml 12.07 ml/m RA Volume:   9.74 ml  5.42 ml/m LA Vol (A4C):   23.3 ml 12.96 ml/m LA Biplane Vol: 24.7 ml 13.73 ml/m  AORTIC  VALVE LVOT Vmax:   113.00 cm/s LVOT Vmean:  87.000 cm/s LVOT VTI:    0.204 m  AORTA Ao Root diam: 2.50 cm MITRAL VALVE MV Area (PHT): 5.46  cm    SHUNTS MV Decel Time: 139 msec    Systemic VTI:  0.20 m MV E velocity: 60.70 cm/s  Systemic Diam: 2.00 cm MV A velocity: 72.20 cm/s MV E/A ratio:  0.84 Jenkins Rouge MD Electronically signed by Jenkins Rouge MD Signature Date/Time: 06/18/2021/1:50:34 PM    Final      Discharge Exam: Vitals:   06/20/21 0459 06/20/21 0828  BP: (!) 184/89 (!) 172/92  Pulse: 82 83  Resp: 14 16  Temp: 97.7 F (36.5 C) 97.7 F (36.5 C)  SpO2: 100% 100%   Vitals:   06/19/21 2029 06/20/21 0024 06/20/21 0459 06/20/21 0828  BP: 139/81 (!) 147/78 (!) 184/89 (!) 172/92  Pulse: 84 84 82 83  Resp: '16 14 14 16  '$ Temp: 98.1 F (36.7 C)  97.7 F (36.5 C) 97.7 F (36.5 C)  TempSrc: Oral  Oral Oral  SpO2: 100%  100% 100%  Weight:      Height:        General: Pt is alert, awake, not in acute distress Cardiovascular: RRR, S1/S2 +, no rubs, no gallops Respiratory: CTA bilaterally, no wheezing, no rhonchi Abdominal: Soft, NT, ND, bowel sounds + Extremities: no edema, no cyanosis    The results of significant diagnostics from this hospitalization (including imaging, microbiology, ancillary and laboratory) are listed below for reference.     Microbiology: Recent Results (from the past 240 hour(s))  Resp Panel by RT-PCR (Flu A&B, Covid) Nasopharyngeal Swab     Status: None   Collection Time: 06/17/21  8:20 PM   Specimen: Nasopharyngeal Swab; Nasopharyngeal(NP) swabs in vial transport medium  Result Value Ref Range Status   SARS Coronavirus 2 by RT PCR NEGATIVE NEGATIVE Final    Comment: (NOTE) SARS-CoV-2 target nucleic acids are NOT DETECTED.  The SARS-CoV-2 RNA is generally detectable in upper respiratory specimens during the acute phase of infection. The lowest concentration of SARS-CoV-2 viral copies this assay can detect is 138 copies/mL. A negative result does  not preclude SARS-Cov-2 infection and should not be used as the sole basis for treatment or other patient management decisions. A negative result may occur with  improper specimen collection/handling, submission of specimen other than nasopharyngeal swab, presence of viral mutation(s) within the areas targeted by this assay, and inadequate number of viral copies(<138 copies/mL). A negative result must be combined with clinical observations, patient history, and epidemiological information. The expected result is Negative.  Fact Sheet for Patients:  EntrepreneurPulse.com.au  Fact Sheet for Healthcare Providers:  IncredibleEmployment.be  This test is no t yet approved or cleared by the [place] FDA and  has been authorized for detection and/or diagnosis of SARS-CoV-2 by FDA under an Emergency Use Authorization (EUA). This EUA will remain  in effect (meaning this test can be used) for the duration of the COVID-19 declaration under Section 564(b)(1) of the Act, 21 U.S.C.section 360bbb-3(b)(1), unless the authorization is terminated  or revoked sooner.       Influenza A by PCR NEGATIVE NEGATIVE Final   Influenza B by PCR NEGATIVE NEGATIVE Final    Comment: (NOTE) The Xpert Xpress SARS-CoV-2/FLU/RSV plus assay is intended as an aid in the diagnosis of influenza from Nasopharyngeal swab specimens and should not be used as a sole basis for treatment. Nasal washings and aspirates are unacceptable for Xpert Xpress SARS-CoV-2/FLU/RSV testing.  Fact Sheet for Patients: EntrepreneurPulse.com.au  Fact Sheet for Healthcare Providers: IncredibleEmployment.be  This test is not yet approved or cleared by the [place] FDA  and has been authorized for detection and/or diagnosis of SARS-CoV-2 by FDA under an Emergency Use Authorization (EUA). This EUA will remain in effect (meaning this test can be used) for the  duration of the COVID-19 declaration under Section 564(b)(1) of the Act, 21 U.S.C. section 360bbb-3(b)(1), unless the authorization is terminated or revoked.  Performed at [facility], [address] [place], [place] [address]      Labs: BNP (last 3 results) No results for input(s): BNP in the last 8760 hours. Basic Metabolic Panel: Recent Labs  Lab 06/17/21 1524 06/18/21 0559 06/19/21 0554 06/20/21 0548  NA 136 134* 134* 138  K 4.8 4.0 4.0 3.9  CL 105 104 103 107  CO2 24 23 21* 24  GLUCOSE 142* 114* 117* 111*  BUN '17 14 18 17  '$ CREATININE 1.44* 1.13* 1.69* 1.35*  CALCIUM 8.9 8.8* 8.8* 8.3*  MG  --  2.2 2.3 2.2  PHOS  --  3.3  --   --    Liver Function Tests: Recent Labs  Lab 06/18/21 0559  AST 11*  ALT 9  ALKPHOS 57  BILITOT 0.5  PROT 6.7  ALBUMIN 3.5   No results for input(s): LIPASE, AMYLASE in the last 168 hours. No results for input(s): AMMONIA in the last 168 hours. CBC: Recent Labs  Lab 06/17/21 1524 06/18/21 0559 06/19/21 0554 06/20/21 0548  WBC 6.6 8.4 7.8 6.3  HGB 10.1* 9.6* 10.1* 9.3*  HCT 30.4* 29.0* 30.6* 28.9*  MCV 88.6 87.3 88.2 88.7  PLT 472* 485* 519* 455*   Cardiac Enzymes: No results for input(s): CKTOTAL, CKMB, CKMBINDEX, TROPONINI in the last 168 hours. BNP: Invalid input(s): POCBNP CBG: Recent Labs  Lab 06/19/21 1627 06/19/21 2054 06/20/21 0023 06/20/21 0456 06/20/21 0746  GLUCAP 115* 119* 104* 100* 106*   D-Dimer No results for input(s): DDIMER in the last 72 hours. Hgb A1c Recent Labs    06/18/21 0559  HGBA1C 7.2*   Lipid Profile Recent Labs    06/18/21 0559  CHOL 211*  HDL 38*  LDLCALC 151*  TRIG 110  CHOLHDL 5.6   Thyroid function studies Recent Labs    06/20/21 0548  TSH 1.228   Anemia work up Recent Labs    06/20/21 0548  VITAMINB12 501   Urinalysis    Component Value Date/Time   COLORURINE YELLOW 06/17/2021 1708   APPEARANCEUR HAZY (A) 06/17/2021 1708   LABSPEC 1.009 06/17/2021 1708    PHURINE 7.0 06/17/2021 1708   GLUCOSEU NEGATIVE 06/17/2021 1708   HGBUR SMALL (A) 06/17/2021 1708   BILIRUBINUR NEGATIVE 06/17/2021 1708   KETONESUR 5 (A) 06/17/2021 1708   PROTEINUR >=300 (A) 06/17/2021 1708   NITRITE NEGATIVE 06/17/2021 1708   LEUKOCYTESUR NEGATIVE 06/17/2021 1708   Sepsis Labs Invalid input(s): PROCALCITONIN,  WBC,  LACTICIDVEN Microbiology Recent Results (from the past 240 hour(s))  Resp Panel by RT-PCR (Flu A&B, Covid) Nasopharyngeal Swab     Status: None   Collection Time: 06/17/21  8:20 PM   Specimen: Nasopharyngeal Swab; Nasopharyngeal(NP) swabs in vial transport medium  Result Value Ref Range Status   SARS Coronavirus 2 by RT PCR NEGATIVE NEGATIVE Final    Comment: (NOTE) SARS-CoV-2 target nucleic acids are NOT DETECTED.  The SARS-CoV-2 RNA is generally detectable in upper respiratory specimens during the acute phase of infection. The lowest concentration of SARS-CoV-2 viral copies this assay can detect is 138 copies/mL. A negative result does not preclude SARS-Cov-2 infection and should not be used as the sole basis  for treatment or other patient management decisions. A negative result may occur with  improper specimen collection/handling, submission of specimen other than nasopharyngeal swab, presence of viral mutation(s) within the areas targeted by this assay, and inadequate number of viral copies(<138 copies/mL). A negative result must be combined with clinical observations, patient history, and epidemiological information. The expected result is Negative.  Fact Sheet for Patients:  EntrepreneurPulse.com.au  Fact Sheet for Healthcare Providers:  IncredibleEmployment.be  This test is no t yet approved or cleared by the [place] FDA and  has been authorized for detection and/or diagnosis of SARS-CoV-2 by FDA under an Emergency Use Authorization (EUA). This EUA will remain  in effect (meaning this test  can be used) for the duration of the COVID-19 declaration under Section 564(b)(1) of the Act, 21 U.S.C.section 360bbb-3(b)(1), unless the authorization is terminated  or revoked sooner.       Influenza A by PCR NEGATIVE NEGATIVE Final   Influenza B by PCR NEGATIVE NEGATIVE Final    Comment: (NOTE) The Xpert Xpress SARS-CoV-2/FLU/RSV plus assay is intended as an aid in the diagnosis of influenza from Nasopharyngeal swab specimens and should not be used as a sole basis for treatment. Nasal washings and aspirates are unacceptable for Xpert Xpress SARS-CoV-2/FLU/RSV testing.  Fact Sheet for Patients: EntrepreneurPulse.com.au  Fact Sheet for Healthcare Providers: IncredibleEmployment.be  This test is not yet approved or cleared by the [place] FDA and has been authorized for detection and/or diagnosis of SARS-CoV-2 by FDA under an Emergency Use Authorization (EUA). This EUA will remain in effect (meaning this test can be used) for the duration of the COVID-19 declaration under Section 564(b)(1) of the Act, 21 U.S.C. section 360bbb-3(b)(1), unless the authorization is terminated or revoked.  Performed at [facility], [address], [place], [place] [address]      Time coordinating discharge: 35 minutes  SIGNED:   Rodena Goldmann, DO [facility] 06/20/2021, 9:39 AM  If 7PM-7AM, please contact night-coverage [email or web address]

## 2021-06-21 ENCOUNTER — Other Ambulatory Visit: Payer: Self-pay

## 2021-06-21 ENCOUNTER — Encounter (HOSPITAL_COMMUNITY): Payer: Self-pay | Admitting: Physical Therapy

## 2021-06-21 ENCOUNTER — Ambulatory Visit (HOSPITAL_COMMUNITY): Payer: BC Managed Care – PPO | Admitting: Physical Therapy

## 2021-06-21 DIAGNOSIS — M6281 Muscle weakness (generalized): Secondary | ICD-10-CM

## 2021-06-21 DIAGNOSIS — R2689 Other abnormalities of gait and mobility: Secondary | ICD-10-CM

## 2021-06-21 LAB — HOMOCYSTEINE: Homocysteine: 17 umol/L — ABNORMAL HIGH (ref 0.0–14.5)

## 2021-06-21 NOTE — Therapy (Signed)
[place], [place], [address] Phone: [phone]   Fax:  [phone]  Physical Therapy Evaluation  Patient Details  Name: Lori Fleming MRN: [id number] Date of Birth: 1970-11-06 Referring Provider (PT): Pratik Manuella Ghazi DO   Encounter Date: 06/21/2021   PT End of Session - 06/21/21 1208     Visit Number 1    Number of Visits 8    Date for PT Re-Evaluation 07/19/21    Authorization Type BCBS (no auth, VL 60)    Authorization - Visit Number 1    Authorization - Number of Visits 35    PT Start Time [id number]    PT Stop Time 1206    PT Time Calculation (min) 31 min    Equipment Utilized During Treatment Gait belt    Activity Tolerance Patient tolerated treatment well    Behavior During Therapy WFL for tasks assessed/performed             Past Medical History:  Diagnosis Date   Diabetes mellitus without complication ([place])    Hypertension     History reviewed. No pertinent surgical history.  There were no vitals filed for this visit.    Subjective Assessment - 06/21/21 1136     Subjective Patient is a 50 y.o. female who presents to physical therapy with referral for weakness. 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 She was discharged from hospital yesterday. Patient states she feels unbalanced. She has improved a lot since even yesterday. She has fallen twice at home so far about a week ago. She is having issues with stumbling and feeling off balance.    Limitations Standing;Walking;House hold activities    Patient Stated Goals get stronger in her legs and better balance    Currently in Pain? No/denies                [facility] PT Assessment - 06/21/21 0001       Assessment   Medical Diagnosis weakness, CVA    Referring Provider (PT) Pratik Manuella Ghazi DO    Onset Date/Surgical Date 06/16/21    Next MD Visit Dr. Sharlet Salina 10/6      Precautions    Precautions Fall      Balance Screen   Has the patient fallen in the past 6 months Yes    How many times? 2    Has the patient had a decrease in activity level because of a fear of falling?  No    Is the patient reluctant to leave their home because of a fear of falling?  No      Prior Function   Level of Independence Independent    Vocation Full time employment      Cognition   Overall Cognitive Status Within Functional Limits for tasks assessed      Observation/Other Assessments   Observations Ambulates without AD    Focus on Therapeutic Outcomes (FOTO)  n/a      Sensation   Light Touch Appears Intact      ROM / Strength   AROM / PROM / Strength Strength      Strength   Strength Assessment Site Hip;Knee;Ankle    Right/Left Hip Right;Left    Right Hip Flexion 4-/5    Left Hip Flexion 4/5    Right/Left Knee Right;Left    Right Knee Flexion 4+/5    Right Knee Extension 4+/5  Left Knee Flexion 5/5    Left Knee Extension 5/5    Right/Left Ankle Right;Left    Right Ankle Dorsiflexion 4/5    Left Ankle Dorsiflexion 5/5      Transfers   Five time sit to stand comments  28.76 seconds without UE support, requires several attempts intermittently      Ambulation/Gait   Ambulation/Gait Yes    Ambulation Distance (Feet) 270 Feet    Assistive device None    Gait Pattern Antalgic    Ambulation Surface Level;Indoor    Gait velocity decreased    Gait Comments 2MWT, intermittent unsteadiness, decreased R UE movement      Standardized Balance Assessment   Standardized Balance Assessment Dynamic Gait Index      Dynamic Gait Index   Level Surface Mild Impairment    Change in Gait Speed Mild Impairment    Gait with Horizontal Head Turns Mild Impairment    Gait with Vertical Head Turns Mild Impairment    Gait and Pivot Turn Mild Impairment    Step Over Obstacle Moderate Impairment    Step Around Obstacles Moderate Impairment    Steps Moderate Impairment    Total Score 13                         Objective measurements completed on examination: See above findings.       [facility] Adult PT Treatment/Exercise - 06/21/21 0001       Exercises   Exercises Knee/Hip      Knee/Hip Exercises: Seated   Long Arc Quad 10 reps;Right    Long Arc Quad Limitations 10 second  holds    Sit to [employer] 10 reps                     PT Education - 06/21/21 1136     Education Details Patient educated on exam findings, POC, scope of PT, initial HEP    Person(s) Educated Patient    Methods Explanation;Handout    Comprehension Verbalized understanding              PT Short Term Goals - 06/21/21 1213       PT SHORT TERM GOAL #1   Title Patient will be independent with HEP in order to improve functional outcomes.    Time 2    Period Weeks    Status New    Target Date 07/05/21      PT SHORT TERM GOAL #2   Title Patient will report at least 25% improvement in symptoms for improved quality of life.    Time 2    Period Weeks    Status New    Target Date 07/05/21               PT Long Term Goals - 06/21/21 1213       PT LONG TERM GOAL #1   Title Patient will report at least 75% improvement in symptoms for improved quality of life.    Time 4    Period Weeks    Status New    Target Date 07/19/21      PT LONG TERM GOAL #2   Title Patient will score at least 20/24 on DGI to demosntrate improved dynamic balance for mobility.    Time 4    Period Weeks    Status New    Target Date 07/19/21      PT LONG TERM GOAL #3  Title Patient will be able to navigate stairs with reciprocal pattern without compensation in order to demonstrate improved LE strength.    Time 4    Period Weeks    Status New    Target Date 07/19/21      PT LONG TERM GOAL #4   Title Patient will be able to complete 5x STS in under 11.4 seconds in order to reduce the risk of falls.    Time 4    Period Weeks    Status New    Target Date 07/19/21                     Plan - 06/21/21 1210     Clinical Impression Statement Patient is a 50 y.o. female who presents to physical therapy with referral for weakness following acute L CVA with resulting R weakness. She presents with deficits in RLE strength, endurance, gait, balance, and functional mobility with ADL. She is having to modify and restrict ADL as indicated by DGI score subjective information and objective measures which is affecting overall participation. Patient will benefit from skilled physical therapy in order to improve function and reduce impairment.    Personal Factors and Comorbidities Comorbidity 3+    Comorbidities DM, HTN, CVA    Examination-Activity Limitations Locomotion Level;Transfers;Squat;Stairs;Stand;Lift    Examination-Participation Restrictions Meal Prep;Cleaning;Occupation;Community Activity;Shop;Volunteer;Dorita Sciara    Stability/Clinical Decision Making Stable/Uncomplicated    Clinical Decision Making Low    Rehab Potential Good    PT Frequency 2x / week    PT Duration 4 weeks    PT Treatment/Interventions ADLs/Self Care Home Management;Aquatic Therapy;Electrical Stimulation;Iontophoresis '4mg'$ /ml Dexamethasone;Moist Heat;Traction;Ultrasound;DME Instruction;Gait training;Stair training;Functional mobility training;Therapeutic activities;Therapeutic exercise;Balance training;Neuromuscular re-education;Patient/family education;Orthotic Fit/Training;Manual techniques;Scar mobilization;Passive range of motion;Energy conservation;Splinting;Dry needling;Taping;Spinal Manipulations;Joint Manipulations    PT Next Visit Plan functional strengthening, gait and balance training    PT Home Exercise Plan STS, LAQ    Recommended Other Services OT - will send referral    Consulted and Agree with Plan of Care Patient             Patient will benefit from skilled therapeutic intervention in order to improve the following deficits and impairments:  Abnormal gait, Difficulty  walking, Decreased endurance, Decreased activity tolerance, Decreased balance, Improper body mechanics, Decreased mobility, Decreased strength  Visit Diagnosis: Muscle weakness (generalized)  Other abnormalities of gait and mobility     Problem List Patient Active Problem List   Diagnosis Date Noted   Thrombocytosis 06/18/2021   Hyperglycemia due to diabetes mellitus ([place]) 06/18/2021   Normocytic anemia 06/18/2021   TIA (transient ischemic attack) 06/18/2021   Right sided weakness 06/17/2021   Hyperlipidemia associated with type 2 diabetes mellitus ([place]) 10/21/2020   Routine general medical examination at a health care facility 04/07/2018   Breast cyst 04/05/2016   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

## 2021-06-21 NOTE — Patient Instructions (Signed)
Access Code: [email or web address] URL: [email or web address] Date: 06/21/2021 Prepared by: Mitzi Hansen April Carlyon  Exercises Seated Long Arc Quad - 3 x daily - 7 x weekly - 10 reps - 10 second hold Sit to Stand with Arms Crossed - 3 x daily - 7 x weekly - 10 reps

## 2021-06-22 ENCOUNTER — Telehealth: Payer: Self-pay

## 2021-06-22 NOTE — Telephone Encounter (Signed)
Transition Care Management Unsuccessful Follow-up Telephone Call  Date of discharge and from where:  06/20/2021 [facility]  Attempts:  1st Attempt  Reason for unsuccessful TCM follow-up call:  No answer/busy  Tomasa Rand, RN, BSN, CEN [place] Coordinator [phone]

## 2021-06-23 ENCOUNTER — Ambulatory Visit (HOSPITAL_COMMUNITY): Payer: BC Managed Care – PPO

## 2021-06-23 ENCOUNTER — Telehealth (HOSPITAL_COMMUNITY): Payer: Self-pay | Admitting: Physical Therapy

## 2021-06-23 NOTE — Telephone Encounter (Signed)
Pt called said she doesn't need our services and she requested to be D/C.

## 2021-06-26 ENCOUNTER — Ambulatory Visit (HOSPITAL_COMMUNITY): Payer: BC Managed Care – PPO

## 2021-06-29 ENCOUNTER — Ambulatory Visit (INDEPENDENT_AMBULATORY_CARE_PROVIDER_SITE_OTHER): Payer: BC Managed Care – PPO | Admitting: Internal Medicine

## 2021-06-29 ENCOUNTER — Other Ambulatory Visit: Payer: Self-pay

## 2021-06-29 ENCOUNTER — Encounter: Payer: Self-pay | Admitting: Internal Medicine

## 2021-06-29 ENCOUNTER — Other Ambulatory Visit: Payer: Self-pay | Admitting: Internal Medicine

## 2021-06-29 DIAGNOSIS — R531 Weakness: Secondary | ICD-10-CM

## 2021-06-29 DIAGNOSIS — E118 Type 2 diabetes mellitus with unspecified complications: Secondary | ICD-10-CM | POA: Diagnosis not present

## 2021-06-29 DIAGNOSIS — I10 Essential (primary) hypertension: Secondary | ICD-10-CM

## 2021-06-29 DIAGNOSIS — Z8673 Personal history of transient ischemic attack (TIA), and cerebral infarction without residual deficits: Secondary | ICD-10-CM

## 2021-06-29 DIAGNOSIS — E785 Hyperlipidemia, unspecified: Secondary | ICD-10-CM

## 2021-06-29 DIAGNOSIS — E1169 Type 2 diabetes mellitus with other specified complication: Secondary | ICD-10-CM

## 2021-06-29 MED ORDER — ATENOLOL 25 MG PO TABS
25.0000 mg | ORAL_TABLET | Freq: Every day | ORAL | 1 refills | Status: DC
Start: 2021-06-29 — End: 2021-12-26

## 2021-06-29 MED ORDER — ONETOUCH VERIO VI STRP: ORAL_STRIP | 12 refills | Status: DC

## 2021-06-29 NOTE — Assessment & Plan Note (Signed)
Lipids not at goal and simvastatin dose increased to 40 mg daily. Will continue and needs follow up lipid panel in 3 months.

## 2021-06-29 NOTE — Assessment & Plan Note (Signed)
With retinopathy and recent stroke. HgA1c at goal most recently on her metformin 750 mg BID and is on ARB and statin.

## 2021-06-29 NOTE — Patient Instructions (Addendum)
We will stop the amlodipine and start atenolol which is a daily medicine.  Come back in 3-4 weeks so we can see if this is working or if we need to adjust the dose.

## 2021-06-29 NOTE — Assessment & Plan Note (Signed)
Uncontrolled and with higher dose simvastatin amlodipine is not an ideal choice. Continue losartan/hctz 100/25 and add atenolol 25 mg daily. Follow up 3-4 weeks.

## 2021-06-29 NOTE — Assessment & Plan Note (Signed)
Recent stroke and now taking simvastatin and plavix and aspirin. After 1 month will continue on aspirin alone. Is improving overall and declines PT/OT at this time. Advised to let us know if she changes her mind about this.

## 2021-06-29 NOTE — Assessment & Plan Note (Signed)
Simvastatin dose increased. Taking aspirin and plavix for 1 month and then will continue about aspirin alone.

## 2021-06-29 NOTE — Progress Notes (Signed)
   Subjective:   Patient ID: Lori Fleming, female    DOB: [birth year], 50 y.o.   MRN: [id number]  HPI The patient is a 50 YO female coming in for hospital follow up (in for stroke, right arm and leg weakness and speech changes, taking plavix and aspirin currently then will continue on aspirin). She is improving and feels leg is almost normal. Arm is improving and just some fine motor deficits in the hand. Speech back to normal.   PMH, [place], social history reviewed and updated  Review of Systems  Constitutional: Negative.   HENT: Negative.    Eyes: Negative.   Respiratory:  Negative for cough, chest tightness and shortness of breath.   Cardiovascular:  Negative for chest pain, palpitations and leg swelling.  Gastrointestinal:  Negative for abdominal distention, abdominal pain, constipation, diarrhea, nausea and vomiting.  Musculoskeletal: Negative.   Skin: Negative.   Neurological:  Positive for weakness.  Psychiatric/Behavioral: Negative.     Objective:  Physical Exam Constitutional:      Appearance: She is well-developed.  HENT:     Head: Normocephalic and atraumatic.  Cardiovascular:     Rate and Rhythm: Normal rate and regular rhythm.  Pulmonary:     Effort: Pulmonary effort is normal. No respiratory distress.     Breath sounds: Normal breath sounds. No wheezing or rales.  Abdominal:     General: Bowel sounds are normal. There is no distension.     Palpations: Abdomen is soft.     Tenderness: There is no abdominal tenderness. There is no rebound.  Musculoskeletal:     Cervical back: Normal range of motion.  Skin:    General: Skin is warm and dry.  Neurological:     Mental Status: She is alert and oriented to person, place, and time.     Sensory: No sensory deficit.     Coordination: Coordination normal.     Comments: Minor weakness right hand, no noticeable weakness right or left leg or arms    Vitals:   06/29/21 0849  BP: (!) 142/88  Pulse: (!) 114  Resp: 18   Temp: 98.3 F (36.8 C)  TempSrc: Oral  SpO2: 99%  Weight: 154 lb 3.2 oz (69.9 kg)  Height: '5\' 6"'$  (1.676 m)    This visit occurred during the SARS-CoV-2 public health emergency.  Safety protocols were in place, including screening questions prior to the visit, additional usage of staff PPE, and extensive cleaning of exam room while observing appropriate contact time as indicated for disinfecting solutions.   Assessment & Plan:

## 2021-06-30 ENCOUNTER — Telehealth: Payer: Self-pay

## 2021-06-30 NOTE — Telephone Encounter (Signed)
Transition Care Management Unsuccessful Follow-up Telephone Call  Date of discharge and from where:  06/20/21 from [facility]  Attempts:  2nd Attempt  Reason for unsuccessful TCM follow-up call:  Left voice message   Thea Silversmith, RN, MSN, BSN, [place] Care Management Coordinator [phone]

## 2021-07-04 ENCOUNTER — Ambulatory Visit: Payer: BC Managed Care – PPO | Admitting: Internal Medicine

## 2021-07-04 ENCOUNTER — Encounter (HOSPITAL_COMMUNITY): Payer: BC Managed Care – PPO

## 2021-07-05 ENCOUNTER — Ambulatory Visit (INDEPENDENT_AMBULATORY_CARE_PROVIDER_SITE_OTHER): Payer: BC Managed Care – PPO | Admitting: Cardiology

## 2021-07-05 ENCOUNTER — Other Ambulatory Visit: Payer: Self-pay

## 2021-07-05 ENCOUNTER — Encounter: Payer: Self-pay | Admitting: Cardiology

## 2021-07-05 VITALS — BP 146/88 | HR 82 | Ht 66.0 in | Wt 161.6 lb

## 2021-07-05 DIAGNOSIS — I10 Essential (primary) hypertension: Secondary | ICD-10-CM | POA: Diagnosis not present

## 2021-07-05 DIAGNOSIS — Z8673 Personal history of transient ischemic attack (TIA), and cerebral infarction without residual deficits: Secondary | ICD-10-CM

## 2021-07-05 DIAGNOSIS — E785 Hyperlipidemia, unspecified: Secondary | ICD-10-CM | POA: Diagnosis not present

## 2021-07-05 MED ORDER — ROSUVASTATIN CALCIUM 10 MG PO TABS: 10.0000 mg | ORAL_TABLET | Freq: Every day | ORAL | 3 refills | Status: DC

## 2021-07-05 NOTE — H&P (View-Only) (Signed)
Cardiology Office Note:    Date:  07/05/2021   ID:  Lori Fleming, DOB 01-Jul-1971, MRN [id number]  PCP:  Lori Koch, MD  Cardiologist:  Lori Heinz, MD  Electrophysiologist:  None   Referring MD: Lori Fleming, *   Chief Complaint  Patient presents with   Cerebrovascular Accident    History of Present Illness:    Lori Fleming is a 50 y.o. female with a hx of T2DM, hypertension, CVA who is referred by Dr Lori Fleming for evaluation of CVA.  Admitted with CVA 06/20/2021.  Echocardiogram on 06/18/2021 showed normal biventricular function, no significant valvular disease.  She was discharged on aspirin plus Plavix for 3 weeks, then plan to decrease to aspirin alone.  MRI brain showed acute infarct of left cerebral peduncle of the midbrain, but also probable remote infarct in corpus callosum.  She does report about 10 years ago she had an episode where she went numb on her right side.  She currently denies any chest pain, dyspnea, headedness, syncope, lower extreme edema, or palpitations.  She was seen by her PCP last week and atenolol was added for her hypertension.  No smoking history.  Family history includes history of TIA in 93s.    Past Medical History:  Diagnosis Date   Diabetes mellitus without complication ([place])    Hypertension     No past surgical history on file.  Current Medications: Current Meds  Medication Sig   aspirin EC 325 MG EC tablet Take 1 tablet (325 mg total) by mouth daily.   atenolol (TENORMIN) 25 MG tablet Take 1 tablet (25 mg total) by mouth daily.   clopidogrel (PLAVIX) 75 MG tablet Take 1 tablet (75 mg total) by mouth daily with breakfast for 21 days.   glucose blood (ONETOUCH VERIO) test strip Use daily to check sugar   losartan-hydrochlorothiazide (HYZAAR) 100-25 MG tablet TAKE 1 TABLET BY MOUTH EVERY DAY   metFORMIN (GLUCOPHAGE-XR) 750 MG 24 hr tablet TAKE 1 TABLET (750 MG TOTAL) BY MOUTH 2 (TWO) TIMES DAILY WITH A MEAL.    Multiple Vitamins-Minerals (WOMENS MULTI VITAMIN & MINERAL PO) Take 1 tablet by mouth daily.   rosuvastatin (CRESTOR) 10 MG tablet Take 1 tablet (10 mg total) by mouth daily.   [DISCONTINUED] simvastatin (ZOCOR) 40 MG tablet Take 1 tablet (40 mg total) by mouth daily at 6 PM.     Allergies:   Patient has no known allergies.   Social History   Socioeconomic History   Marital status: Married    Spouse name: Not on file   Number of children: Not on file   Years of education: Not on file   Highest education level: Not on file  Occupational History   Not on file  Tobacco Use   Smoking status: Never   Smokeless tobacco: Never  Substance and Sexual Activity   Alcohol use: No    Alcohol/week: 0.0 standard drinks   Drug use: No   Sexual activity: Not on file  Other Topics Concern   Not on file  Social History Narrative   Not on file   Social Determinants of Health   Financial Resource Strain: Not on file  Food Insecurity: Not on file  Transportation Needs: Not on file  Physical Activity: Not on file  Stress: Not on file  Social Connections: Not on file     Family History: The patient's family history includes Alcohol abuse in her paternal grandfather; Diabetes in her father and maternal aunt; Hypertension  in her father and sister; Mental illness in her brother and sister.  ROS:   Please see the history of present illness.     All other systems reviewed and are negative.  EKGs/Labs/Other Studies Reviewed:    The following studies were reviewed today:   EKG:  EKG is not ordered today.  The ekg ordered 06/17/21 demonstrates normal sinus rhythm, rate 94, borderline LVH/left atrial enlargement  Recent Labs: 06/18/2021: ALT 9 06/20/2021: BUN 17; Creatinine, Ser 1.35; Hemoglobin 9.3; Magnesium 2.2; Platelets 455; Potassium 3.9; Sodium 138; TSH 1.228  Recent Lipid Panel    Component Value Date/Time   CHOL 211 (H) 06/18/2021 0559   TRIG 110 06/18/2021 0559   HDL 38 (L)  06/18/2021 0559   CHOLHDL 5.6 06/18/2021 0559   VLDL 22 06/18/2021 0559   LDLCALC 151 (H) 06/18/2021 0559   LDLCALC 187 (H) 10/21/2020 1705    Physical Exam:    VS:  BP (!) 146/88   Pulse 82   Ht '5\' 6"'$  (1.676 m)   Wt 161 lb 9.6 oz (73.3 kg)   LMP 06/11/2021 (Exact Date)   SpO2 98%   BMI 26.08 kg/m     Wt Readings from Last 3 Encounters:  07/05/21 161 lb 9.6 oz (73.3 kg)  06/29/21 154 lb 3.2 oz (69.9 kg)  06/18/21 151 lb 14.4 oz (68.9 kg)     GEN:  Well nourished, well developed in no acute distress HEENT: Normal NECK: No JVD; No carotid bruits LYMPHATICS: No lymphadenopathy CARDIAC: RRR, no murmurs, rubs, gallops RESPIRATORY:  Clear to auscultation without rales, wheezing or rhonchi  ABDOMEN: Soft, non-tender, non-distended MUSCULOSKELETAL:  No edema; No deformity  SKIN: Warm and dry NEUROLOGIC:  Alert and oriented x 3 PSYCHIATRIC:  Normal affect   ASSESSMENT:    1. History of CVA (cerebrovascular accident)   2. Essential hypertension   3. Hyperlipidemia, unspecified hyperlipidemia type    PLAN:    CVA: Admitted with CVA 06/20/2021.  Echocardiogram on 06/18/2021 showed normal biventricular function, no significant valvular disease.  She was discharged on aspirin plus Plavix for 3 weeks, then plan to decrease to aspirin alone.  MRI brain showed acute infarct of left cerebral peduncle of the midbrain, but also probable remote infarct in corpus callosum.   -Follow with neurology -On aspirin, Plavix, statin -Unclear cause, given young age recommend TEE to exclude cardiac etiology.  Discussed loop recorder but she declines at this time.  We will do 30-day [employer] monitor to evaluate for Afib  Hypertension: On losartan-hydrochlorothiazide 100-25 mg daily and was just started on atenolol 25 mg daily by PCP.  BP remains mildly elevated, will monitor response since just started atenolol  Hyperlipidemia: On simvastatin 40 mg.  LDL 151 on 06/18/2021.  Will switch to  rosuvastatin 10 mg daily  RTC in 2 months   Medication Adjustments/Labs and Tests Ordered: Current medicines are reviewed at length with the patient today.  Concerns regarding medicines are outlined above.  Orders Placed This Encounter  Procedures   CBC   Basic metabolic panel   Ambulatory referral to Neurology   Cardiac event monitor   Meds ordered this encounter  Medications   rosuvastatin (CRESTOR) 10 MG tablet    Sig: Take 1 tablet (10 mg total) by mouth daily.    Dispense:  90 tablet    Refill:  3    07/05/21 simvastatin  stopped     Patient Instructions  Medication Instructions:  STOP Simvastatin   START Crestor 10  mg daily at dinner *If you need a refill on your cardiac medications before your next appointment, please call your pharmacy*   Lab Work: Cbc,bmet  If you have labs (blood work) drawn today and your tests are completely normal, you will receive your results only by: [place] (if you have MyChart) OR A paper copy in the mail If you have any lab test that is abnormal or we need to change your treatment, we will call you to review the results.   Testing/Procedures: Your physician has requested that you have a TEE. During a TEE, sound waves are used to create images of your heart. It provides your doctor with information about the size and shape of your heart and how well your heart's chambers and valves are working. In this test, a transducer is attached to the end of a flexible tube that's guided down your throat and into your esophagus (the tube leading from you mouth to your stomach) to get a more detailed image of your heart. You are not awake for the procedure. Please see the instruction sheet given to you today. For further information please visit [email or web address].    Your physician has recommended that you wear an event monitor. Event monitors are medical devices that record the heart's electrical activity. Doctors most often [place] these  monitors to diagnose arrhythmias. Arrhythmias are problems with the speed or rhythm of the heartbeat. The monitor is a small, portable device. You can wear one while you do your normal daily activities. This is usually used to diagnose what is causing palpitations/syncope (passing out).  Follow-Up: At [facility], you and your health needs are our priority.  As part of our continuing mission to provide you with exceptional heart care, we have created designated Provider Care Teams.  These Care Teams include your primary Cardiologist (physician) and Advanced Practice Providers (APPs -  Physician Assistants and Nurse Practitioners) who all work together to provide you with the care you need, when you need it.  We recommend signing up for the patient portal called "MyChart".  Sign up information is provided on this After Visit Summary.  MyChart is used to connect with patients for Virtual Visits (Telemedicine).  Patients are able to view lab/test results, encounter notes, upcoming appointments, etc.  Non-urgent messages can be sent to your provider as well.   To learn more about what you can do with MyChart, go to [email or web address].    Your next appointment:  08/31/2021 with Dr.Analaya Hoey  Provider:   Dr.Mayce Noyes   Other Instructions You have been referred to [facility] Neurology. They will call you to schedule an appointment.    Signed, Lori Heinz, MD  07/05/2021 6:01 PM    [facility]

## 2021-07-05 NOTE — Patient Instructions (Addendum)
Medication Instructions:  STOP Simvastatin   START Crestor 10 mg daily at dinner *If you need a refill on your cardiac medications before your next appointment, please call your pharmacy*   Lab Work: Cbc,bmet  If you have labs (blood work) drawn today and your tests are completely normal, you will receive your results only by: Lori Fleming (if you have MyChart) OR A paper copy in the mail If you have any lab test that is abnormal or we need to change your treatment, we will call you to review the results.   Testing/Procedures: Your physician has requested that you have a TEE. During a TEE, sound waves are used to create images of your heart. It provides your doctor with information about the size and shape of your heart and how well your heart's chambers and valves are working. In this test, a transducer is attached to the end of a flexible tube that's guided down your throat and into your esophagus (the tube leading from you mouth to your stomach) to get a more detailed image of your heart. You are not awake for the procedure. Please see the instruction sheet given to you today. For further information please visit [email or web address].    Your physician has recommended that you wear an event monitor. Event monitors are medical devices that record the heart's electrical activity. Doctors most often [place] these monitors to diagnose arrhythmias. Arrhythmias are problems with the speed or rhythm of the heartbeat. The monitor is a small, portable device. You can wear one while you do your normal daily activities. This is usually used to diagnose what is causing palpitations/syncope (passing out).  Follow-Up: At [facility], you and your health needs are our priority.  As part of our continuing mission to provide you with exceptional heart care, we have created designated Provider Care Teams.  These Care Teams include your primary Cardiologist (physician) and Advanced Practice Providers (APPs -   Physician Assistants and Nurse Practitioners) who all work together to provide you with the care you need, when you need it.  We recommend signing up for the patient portal called "MyChart".  Sign up information is provided on this After Visit Summary.  MyChart is used to connect with patients for Virtual Visits (Telemedicine).  Patients are able to view lab/test results, encounter notes, upcoming appointments, etc.  Non-urgent messages can be sent to your provider as well.   To learn more about what you can do with MyChart, go to [email or web address].    Your next appointment:  08/31/2021 with Dr.Schumann  Provider:   Dr.Schumann   Other Instructions You have been referred to [facility] Neurology. They will call you to schedule an appointment.

## 2021-07-05 NOTE — Progress Notes (Signed)
Cardiology Office Note:    Date:  07/05/2021   ID:  Lori Fleming, DOB Dec 28, 1970, MRN [id number]  PCP:  Lori Koch, MD  Cardiologist:  Lori Heinz, MD  Electrophysiologist:  None   Referring MD: Lori Fleming, *   Chief Complaint  Patient presents with   Cerebrovascular Accident    History of Present Illness:    Lori Fleming is a 50 y.o. female with a hx of T2DM, hypertension, CVA who is referred by Lori Fleming for evaluation of CVA.  Admitted with CVA 06/20/2021.  Echocardiogram on 06/18/2021 showed normal biventricular function, no significant valvular disease.  She was discharged on aspirin plus Plavix for 3 weeks, then plan to decrease to aspirin alone.  MRI brain showed acute infarct of left cerebral peduncle of the midbrain, but also probable remote infarct in corpus callosum.  She does report about 10 years ago she had an episode where she went numb on her right side.  She currently denies any chest pain, dyspnea, headedness, syncope, lower extreme edema, or palpitations.  She was seen by her PCP last week and atenolol was added for her hypertension.  No smoking history.  Family history includes history of TIA in 78s.    Past Medical History:  Diagnosis Date   Diabetes mellitus without complication ([place])    Hypertension     No past surgical history on file.  Current Medications: Current Meds  Medication Sig   aspirin EC 325 MG EC tablet Take 1 tablet (325 mg total) by mouth daily.   atenolol (TENORMIN) 25 MG tablet Take 1 tablet (25 mg total) by mouth daily.   clopidogrel (PLAVIX) 75 MG tablet Take 1 tablet (75 mg total) by mouth daily with breakfast for 21 days.   glucose blood (ONETOUCH VERIO) test strip Use daily to check sugar   losartan-hydrochlorothiazide (HYZAAR) 100-25 MG tablet TAKE 1 TABLET BY MOUTH EVERY DAY   metFORMIN (GLUCOPHAGE-XR) 750 MG 24 hr tablet TAKE 1 TABLET (750 MG TOTAL) BY MOUTH 2 (TWO) TIMES DAILY WITH A MEAL.    Multiple Vitamins-Minerals (WOMENS MULTI VITAMIN & MINERAL PO) Take 1 tablet by mouth daily.   rosuvastatin (CRESTOR) 10 MG tablet Take 1 tablet (10 mg total) by mouth daily.   [DISCONTINUED] simvastatin (ZOCOR) 40 MG tablet Take 1 tablet (40 mg total) by mouth daily at 6 PM.     Allergies:   Patient has no known allergies.   Social History   Socioeconomic History   Marital status: Married    Spouse name: Not on file   Number of children: Not on file   Years of education: Not on file   Highest education level: Not on file  Occupational History   Not on file  Tobacco Use   Smoking status: Never   Smokeless tobacco: Never  Substance and Sexual Activity   Alcohol use: No    Alcohol/week: 0.0 standard drinks   Drug use: No   Sexual activity: Not on file  Other Topics Concern   Not on file  Social History Narrative   Not on file   Social Determinants of Health   Financial Resource Strain: Not on file  Food Insecurity: Not on file  Transportation Needs: Not on file  Physical Activity: Not on file  Stress: Not on file  Social Connections: Not on file     Family History: The patient's family history includes Alcohol abuse in her paternal grandfather; Diabetes in her father and maternal aunt; Hypertension  in her father and sister; Mental illness in her brother and sister.  ROS:   Please see the history of present illness.     All other systems reviewed and are negative.  EKGs/Labs/Other Studies Reviewed:    The following studies were reviewed today:   EKG:  EKG is not ordered today.  The ekg ordered 06/17/21 demonstrates normal sinus rhythm, rate 94, borderline LVH/left atrial enlargement  Recent Labs: 06/18/2021: ALT 9 06/20/2021: BUN 17; Creatinine, Ser 1.35; Hemoglobin 9.3; Magnesium 2.2; Platelets 455; Potassium 3.9; Sodium 138; TSH 1.228  Recent Lipid Panel    Component Value Date/Time   CHOL 211 (H) 06/18/2021 0559   TRIG 110 06/18/2021 0559   HDL 38 (L)  06/18/2021 0559   CHOLHDL 5.6 06/18/2021 0559   VLDL 22 06/18/2021 0559   LDLCALC 151 (H) 06/18/2021 0559   LDLCALC 187 (H) 10/21/2020 1705    Physical Exam:    VS:  BP (!) 146/88   Pulse 82   Ht '5\' 6"'$  (1.676 m)   Wt 161 lb 9.6 oz (73.3 kg)   LMP 06/11/2021 (Exact Date)   SpO2 98%   BMI 26.08 kg/m     Wt Readings from Last 3 Encounters:  07/05/21 161 lb 9.6 oz (73.3 kg)  06/29/21 154 lb 3.2 oz (69.9 kg)  06/18/21 151 lb 14.4 oz (68.9 kg)     GEN:  Well nourished, well developed in no acute distress HEENT: Normal NECK: No JVD; No carotid bruits LYMPHATICS: No lymphadenopathy CARDIAC: RRR, no murmurs, rubs, gallops RESPIRATORY:  Clear to auscultation without rales, wheezing or rhonchi  ABDOMEN: Soft, non-tender, non-distended MUSCULOSKELETAL:  No edema; No deformity  SKIN: Warm and dry NEUROLOGIC:  Alert and oriented x 3 PSYCHIATRIC:  Normal affect   ASSESSMENT:    1. History of CVA (cerebrovascular accident)   2. Essential hypertension   3. Hyperlipidemia, unspecified hyperlipidemia type    PLAN:    CVA: Admitted with CVA 06/20/2021.  Echocardiogram on 06/18/2021 showed normal biventricular function, no significant valvular disease.  She was discharged on aspirin plus Plavix for 3 weeks, then plan to decrease to aspirin alone.  MRI brain showed acute infarct of left cerebral peduncle of the midbrain, but also probable remote infarct in corpus callosum.   -Follow with neurology -On aspirin, Plavix, statin -Unclear cause, given young age recommend TEE to exclude cardiac etiology.  Discussed loop recorder but she declines at this time.  We will do 30-day [employer] monitor to evaluate for Afib  Hypertension: On losartan-hydrochlorothiazide 100-25 mg daily and was just started on atenolol 25 mg daily by PCP.  BP remains mildly elevated, will monitor response since just started atenolol  Hyperlipidemia: On simvastatin 40 mg.  LDL 151 on 06/18/2021.  Will switch to  rosuvastatin 10 mg daily  RTC in 2 months   Medication Adjustments/Labs and Tests Ordered: Current medicines are reviewed at length with the patient today.  Concerns regarding medicines are outlined above.  Orders Placed This Encounter  Procedures   CBC   Basic metabolic panel   Ambulatory referral to Neurology   Cardiac event monitor   Meds ordered this encounter  Medications   rosuvastatin (CRESTOR) 10 MG tablet    Sig: Take 1 tablet (10 mg total) by mouth daily.    Dispense:  90 tablet    Refill:  3    07/05/21 simvastatin  stopped     Patient Instructions  Medication Instructions:  STOP Simvastatin   START Crestor 10  mg daily at dinner *If you need a refill on your cardiac medications before your next appointment, please call your pharmacy*   Lab Work: Cbc,bmet  If you have labs (blood work) drawn today and your tests are completely normal, you will receive your results only by: [place] (if you have MyChart) OR A paper copy in the mail If you have any lab test that is abnormal or we need to change your treatment, we will call you to review the results.   Testing/Procedures: Your physician has requested that you have a TEE. During a TEE, sound waves are used to create images of your heart. It provides your doctor with information about the size and shape of your heart and how well your heart's chambers and valves are working. In this test, a transducer is attached to the end of a flexible tube that's guided down your throat and into your esophagus (the tube leading from you mouth to your stomach) to get a more detailed image of your heart. You are not awake for the procedure. Please see the instruction sheet given to you today. For further information please visit [email or web address].    Your physician has recommended that you wear an event monitor. Event monitors are medical devices that record the heart's electrical activity. Doctors most often [place] these  monitors to diagnose arrhythmias. Arrhythmias are problems with the speed or rhythm of the heartbeat. The monitor is a small, portable device. You can wear one while you do your normal daily activities. This is usually used to diagnose what is causing palpitations/syncope (passing out).  Follow-Up: At [facility], you and your health needs are our priority.  As part of our continuing mission to provide you with exceptional heart care, we have created designated Provider Care Teams.  These Care Teams include your primary Cardiologist (physician) and Advanced Practice Providers (APPs -  Physician Assistants and Nurse Practitioners) who all work together to provide you with the care you need, when you need it.  We recommend signing up for the patient portal called "MyChart".  Sign up information is provided on this After Visit Summary.  MyChart is used to connect with patients for Virtual Visits (Telemedicine).  Patients are able to view lab/test results, encounter notes, upcoming appointments, etc.  Non-urgent messages can be sent to your provider as well.   To learn more about what you can do with MyChart, go to [email or web address].    Your next appointment:  08/31/2021 with Lori.Jashayla Glatfelter  Provider:   Dr.Serria Sloma   Other Instructions You have been referred to [facility] Neurology. They will call you to schedule an appointment.    Signed, Lori Heinz, MD  07/05/2021 6:01 PM    [facility]

## 2021-07-06 ENCOUNTER — Encounter (HOSPITAL_COMMUNITY): Payer: BC Managed Care – PPO

## 2021-07-06 NOTE — Patient Instructions (Signed)
Lori Fleming  07/06/2021     '@PREFPERIOPPHARMACY'$ @   Your procedure is scheduled on  07/10/2021.   Report to [facility] at  0900 A.M.   Call this number if you have problems the morning of surgery:  [phone]   Remember:  Do not eat or drink after midnight.     DO NOT take any medications for diabetes the morning of your procedure.      Take these medicines the morning of surgery with A SIP OF WATER                                    atenolol.    Do not wear jewelry, make-up or nail polish.  Do not wear lotions, powders, or perfumes, or deodorant.  Do not shave 48 hours prior to surgery.  Men may shave face and neck.  Do not bring valuables to the hospital.  [facility] is not responsible for any belongings or valuables.  Contacts, dentures or bridgework may not be worn into surgery.  Leave your suitcase in the car.  After surgery it may be brought to your room.  For patients admitted to the hospital, discharge time will be determined by your treatment team.  Patients discharged the day of surgery will not be allowed to drive home and must have someone with them for 24 hours.    Special instructions:          DO NOT smoke tobacco or vape for 24 hours before your procedure.  Please read over the following fact sheets that you were given. Anesthesia Post-op Instructions and Care and Recovery After Surgery      Monitored Anesthesia Care, Care After This sheet gives you information about how to care for yourself after your procedure. Your health care provider may also give you more specific instructions. If you have problems or questions, contact your health care provider. What can I expect after the procedure? After the procedure, it is common to have: Tiredness. Forgetfulness about what happened after the procedure. Impaired judgment for important decisions. Nausea or vomiting. Some difficulty with balance. Follow these instructions at home: For the  time period you were told by your health care provider:   Rest as needed. Do not participate in activities where you could fall or become injured. Do not drive or use machinery. Do not drink alcohol. Do not take sleeping pills or medicines that cause drowsiness. Do not make important decisions or sign legal documents. Do not take care of children on your own. Eating and drinking Follow the diet that is recommended by your health care provider. Drink enough fluid to keep your urine pale yellow. If you vomit: Drink water, juice, or soup when you can drink without vomiting. Make sure you have little or no nausea before eating solid foods. General instructions Have a responsible adult stay with you for the time you are told. It is important to have someone help care for you until you are awake and alert. Take over-the-counter and prescription medicines only as told by your health care provider. If you have sleep apnea, surgery and certain medicines can increase your risk for breathing problems. Follow instructions from your health care provider about wearing your sleep device: Anytime you are sleeping, including during daytime naps. While taking prescription pain medicines, sleeping medicines, or medicines that make you drowsy. Avoid smoking. Keep all follow-up visits as  told by your health care provider. This is important. Contact a health care provider if: You keep feeling nauseous or you keep vomiting. You feel light-headed. You are still sleepy or having trouble with balance after 24 hours. You develop a rash. You have a fever. You have redness or swelling around the IV site. Get help right away if: You have trouble breathing. You have new-onset confusion at home. Summary For several hours after your procedure, you may feel tired. You may also be forgetful and have poor judgment. Have a responsible adult stay with you for the time you are told. It is important to have someone help  care for you until you are awake and alert. Rest as told. Do not drive or operate machinery. Do not drink alcohol or take sleeping pills. Get help right away if you have trouble breathing, or if you suddenly become confused. This information is not intended to replace advice given to you by your health care provider. Make sure you discuss any questions you have with your health care provider. Document Revised: 05/26/2020 Document Reviewed: 08/13/2019 Elsevier Patient Education  2022 [employer].

## 2021-07-07 ENCOUNTER — Other Ambulatory Visit: Payer: Self-pay

## 2021-07-07 ENCOUNTER — Encounter (HOSPITAL_COMMUNITY)
Admission: RE | Admit: 2021-07-07 | Discharge: 2021-07-07 | Disposition: A | Discharge status: Home / Self Care | Payer: BC Managed Care – PPO | Source: Ambulatory Visit | Attending: Cardiovascular Disease | Admitting: Cardiovascular Disease

## 2021-07-07 ENCOUNTER — Encounter (HOSPITAL_COMMUNITY): Payer: Self-pay

## 2021-07-07 DIAGNOSIS — Z7984 Long term (current) use of oral hypoglycemic drugs: Secondary | ICD-10-CM | POA: Diagnosis not present

## 2021-07-07 DIAGNOSIS — Z01812 Encounter for preprocedural laboratory examination: Secondary | ICD-10-CM | POA: Insufficient documentation

## 2021-07-07 DIAGNOSIS — I639 Cerebral infarction, unspecified: Secondary | ICD-10-CM | POA: Diagnosis not present

## 2021-07-07 DIAGNOSIS — E785 Hyperlipidemia, unspecified: Secondary | ICD-10-CM | POA: Diagnosis not present

## 2021-07-07 DIAGNOSIS — Z7902 Long term (current) use of antithrombotics/antiplatelets: Secondary | ICD-10-CM | POA: Diagnosis not present

## 2021-07-07 DIAGNOSIS — Z8673 Personal history of transient ischemic attack (TIA), and cerebral infarction without residual deficits: Secondary | ICD-10-CM | POA: Diagnosis not present

## 2021-07-07 DIAGNOSIS — Z79899 Other long term (current) drug therapy: Secondary | ICD-10-CM | POA: Diagnosis not present

## 2021-07-07 DIAGNOSIS — Z8249 Family history of ischemic heart disease and other diseases of the circulatory system: Secondary | ICD-10-CM | POA: Diagnosis not present

## 2021-07-07 DIAGNOSIS — Z833 Family history of diabetes mellitus: Secondary | ICD-10-CM | POA: Diagnosis not present

## 2021-07-07 DIAGNOSIS — E119 Type 2 diabetes mellitus without complications: Secondary | ICD-10-CM | POA: Diagnosis not present

## 2021-07-07 DIAGNOSIS — I10 Essential (primary) hypertension: Secondary | ICD-10-CM | POA: Diagnosis not present

## 2021-07-07 DIAGNOSIS — Z7982 Long term (current) use of aspirin: Secondary | ICD-10-CM | POA: Diagnosis not present

## 2021-07-07 HISTORY — DX: Cerebral infarction, unspecified: I63.9

## 2021-07-07 LAB — HEMOGLOBIN A1C
Hgb A1c MFr Bld: 7 % — ABNORMAL HIGH (ref 4.8–5.6)
Mean Plasma Glucose: 154.2 mg/dL

## 2021-07-07 LAB — BASIC METABOLIC PANEL
Anion gap: 7 (ref 5–15)
BUN: 17 mg/dL (ref 6–20)
CO2: 21 mmol/L — ABNORMAL LOW (ref 22–32)
Calcium: 8.7 mg/dL — ABNORMAL LOW (ref 8.9–10.3)
Chloride: 108 mmol/L (ref 98–111)
Creatinine, Ser: 1.27 mg/dL — ABNORMAL HIGH (ref 0.44–1.00)
GFR, Estimated: 52 mL/min — ABNORMAL LOW (ref >60)
Glucose, Bld: 95 mg/dL (ref 70–99)
Potassium: 4.7 mmol/L (ref 3.5–5.1)
Sodium: 136 mmol/L (ref 135–145)

## 2021-07-07 LAB — HCG, SERUM, QUALITATIVE: Preg, Serum: NEGATIVE

## 2021-07-10 ENCOUNTER — Ambulatory Visit (HOSPITAL_COMMUNITY)
Admission: RE | Admit: 2021-07-10 | Discharge: 2021-07-10 | Disposition: A | Discharge status: Home / Self Care | Payer: BC Managed Care – PPO | Attending: Cardiovascular Disease | Admitting: Cardiovascular Disease

## 2021-07-10 ENCOUNTER — Ambulatory Visit (HOSPITAL_COMMUNITY): Payer: BC Managed Care – PPO | Admitting: Anesthesiology

## 2021-07-10 ENCOUNTER — Other Ambulatory Visit: Payer: Self-pay

## 2021-07-10 ENCOUNTER — Ambulatory Visit (HOSPITAL_BASED_OUTPATIENT_CLINIC_OR_DEPARTMENT_OTHER)
Admission: RE | Admit: 2021-07-10 | Discharge: 2021-07-10 | Disposition: A | Discharge status: Home / Self Care | Payer: BC Managed Care – PPO | Source: Ambulatory Visit | Attending: Cardiovascular Disease | Admitting: Cardiovascular Disease

## 2021-07-10 ENCOUNTER — Encounter (HOSPITAL_COMMUNITY)
Admission: RE | Disposition: A | Discharge status: Home / Self Care | Payer: Self-pay | Source: Home / Self Care | Attending: Cardiovascular Disease

## 2021-07-10 ENCOUNTER — Encounter (HOSPITAL_COMMUNITY): Payer: Self-pay | Admitting: Cardiovascular Disease

## 2021-07-10 DIAGNOSIS — Z833 Family history of diabetes mellitus: Secondary | ICD-10-CM | POA: Insufficient documentation

## 2021-07-10 DIAGNOSIS — I639 Cerebral infarction, unspecified: Secondary | ICD-10-CM | POA: Insufficient documentation

## 2021-07-10 DIAGNOSIS — Z7984 Long term (current) use of oral hypoglycemic drugs: Secondary | ICD-10-CM | POA: Diagnosis not present

## 2021-07-10 DIAGNOSIS — Z8673 Personal history of transient ischemic attack (TIA), and cerebral infarction without residual deficits: Secondary | ICD-10-CM | POA: Insufficient documentation

## 2021-07-10 DIAGNOSIS — E119 Type 2 diabetes mellitus without complications: Secondary | ICD-10-CM | POA: Insufficient documentation

## 2021-07-10 DIAGNOSIS — I10 Essential (primary) hypertension: Secondary | ICD-10-CM | POA: Insufficient documentation

## 2021-07-10 DIAGNOSIS — I6389 Other cerebral infarction: Secondary | ICD-10-CM

## 2021-07-10 DIAGNOSIS — Z7982 Long term (current) use of aspirin: Secondary | ICD-10-CM | POA: Insufficient documentation

## 2021-07-10 DIAGNOSIS — Z7902 Long term (current) use of antithrombotics/antiplatelets: Secondary | ICD-10-CM | POA: Insufficient documentation

## 2021-07-10 DIAGNOSIS — Z8249 Family history of ischemic heart disease and other diseases of the circulatory system: Secondary | ICD-10-CM | POA: Insufficient documentation

## 2021-07-10 DIAGNOSIS — Z79899 Other long term (current) drug therapy: Secondary | ICD-10-CM | POA: Diagnosis not present

## 2021-07-10 DIAGNOSIS — E785 Hyperlipidemia, unspecified: Secondary | ICD-10-CM | POA: Insufficient documentation

## 2021-07-10 HISTORY — PX: TEE WITHOUT CARDIOVERSION: SHX5443

## 2021-07-10 LAB — ECHO TEE

## 2021-07-10 LAB — GLUCOSE, CAPILLARY: Glucose-Capillary: 110 mg/dL — ABNORMAL HIGH (ref 70–99)

## 2021-07-10 SURGERY — ECHOCARDIOGRAM, TRANSESOPHAGEAL
Anesthesia: General

## 2021-07-10 MED ORDER — LIDOCAINE HCL (PF) 2 % IJ SOLN
INTRAMUSCULAR | Status: AC
  Filled 2021-07-10: qty 5

## 2021-07-10 MED ORDER — PROPOFOL 10 MG/ML IV BOLUS
INTRAVENOUS | Status: AC
  Filled 2021-07-10: qty 40

## 2021-07-10 MED ORDER — BUTAMBEN-TETRACAINE-BENZOCAINE 2-2-14 % EX AERO
INHALATION_SPRAY | CUTANEOUS | Status: AC
  Filled 2021-07-10: qty 5

## 2021-07-10 MED ORDER — SODIUM CHLORIDE 0.9 % IV SOLN: INTRAVENOUS | Status: DC

## 2021-07-10 MED ORDER — SODIUM CHLORIDE BACTERIOSTATIC 0.9 % IJ SOLN
INTRAMUSCULAR | Status: AC
  Filled 2021-07-10: qty 10

## 2021-07-10 MED ORDER — PROPOFOL 10 MG/ML IV BOLUS
INTRAVENOUS | Status: DC | PRN
  Administered 2021-07-10: 20 mg via INTRAVENOUS
  Administered 2021-07-10: 100 mg via INTRAVENOUS

## 2021-07-10 MED ORDER — LACTATED RINGERS IV SOLN: INTRAVENOUS | Status: DC

## 2021-07-10 NOTE — Progress Notes (Signed)
*  PRELIMINARY RESULTS* Echocardiogram Echocardiogram Transesophageal has been performed with saline bubble study.  Samuel Germany 07/10/2021, 12:25 PM

## 2021-07-10 NOTE — Anesthesia Preprocedure Evaluation (Signed)
Anesthesia Evaluation  Patient identified by MRN, date of birth, ID band Patient awake    Reviewed: Allergy & Precautions, NPO status , Patient's Chart, lab work & pertinent test results, reviewed documented beta blocker date and time   Airway Mallampati: II  TM Distance: >3 FB Neck ROM: Full    Dental  (+) Dental Advisory Given, Teeth Intact   Pulmonary neg pulmonary ROS,    Pulmonary exam normal breath sounds clear to auscultation       Cardiovascular Exercise Tolerance: Good hypertension, Pt. on home beta blockers and Pt. on medications + Valvular Problems/Murmurs  Rhythm:Regular Rate:Normal + Systolic murmurs 1. Left ventricular ejection fraction, by estimation, is 60 to 65%. The left ventricle has normal function. The left ventricle has no regional wall motion abnormalities. Left ventricular diastolic parameters were  normal.  2. Right ventricular systolic function is normal. The right ventricular size is normal.  3. The mitral valve is abnormal. No evidence of mitral valve regurgitation. No evidence of mitral stenosis.  4. The aortic valve was not well visualized. There is mild calcification of the aortic valve. Aortic valve regurgitation is not visualized. Mild aortic valve sclerosis is present, with no evidence of aortic valve  stenosis.  5. The inferior vena cava is normal in size with greater than 50% respiratory variability, suggesting right atrial pressure of 3 mmHg.    Neuro/Psych CVA (right sided weakness), Residual Symptoms negative psych ROS   GI/Hepatic negative GI ROS, Neg liver ROS,   Endo/Other  diabetes, Well Controlled, Type 2, Oral Hypoglycemic Agents  Renal/GU Renal InsufficiencyRenal disease  negative genitourinary   Musculoskeletal negative musculoskeletal ROS (+)   Abdominal   Peds negative pediatric ROS (+)  Hematology negative hematology ROS (+) anemia ,   Anesthesia Other Findings    Reproductive/Obstetrics negative OB ROS                             Anesthesia Physical Anesthesia Plan  ASA: 3  Anesthesia Plan: General   Post-op Pain Management:    Induction: Intravenous  PONV Risk Score and Plan: TIVA  Airway Management Planned: Nasal Cannula and Natural Airway  Additional Equipment:   Intra-op Plan:   Post-operative Plan:   Informed Consent: I have reviewed the patients History and Physical, chart, labs and discussed the procedure including the risks, benefits and alternatives for the proposed anesthesia with the patient or authorized representative who has indicated his/her understanding and acceptance.     Dental advisory given  Plan Discussed with: CRNA and Surgeon  Anesthesia Plan Comments:         Anesthesia Quick Evaluation

## 2021-07-10 NOTE — Anesthesia Postprocedure Evaluation (Signed)
Anesthesia Post Note  Patient: Anja Desautel  Procedure(s) Performed: TRANSESOPHAGEAL ECHOCARDIOGRAM (TEE)  Patient location during evaluation: PACU Anesthesia Type: General Level of consciousness: awake and alert and oriented Pain management: pain level controlled Vital Signs Assessment: post-procedure vital signs reviewed and stable Respiratory status: spontaneous breathing, nonlabored ventilation and respiratory function stable Cardiovascular status: blood pressure returned to baseline and stable Postop Assessment: no apparent nausea or vomiting Anesthetic complications: no   No notable events documented.   Last Vitals:  Vitals:   07/10/21 1211 07/10/21 1215  BP: (!) 168/83 (!) 177/88  Pulse: 63 68  Resp: 16 16  Temp:    SpO2: 100% 100%    Last Pain:  Vitals:   07/10/21 1220  TempSrc:   PainSc: 0-No pain                 Shirrell Solinger C Lysander Calixte

## 2021-07-10 NOTE — Op Note (Signed)
    TRANSESOPHAGEAL ECHOCARDIOGRAM   NAME:  Lori Fleming    MRN: [id number] DOB:  1971/02/06    ADMIT DATE: 07/10/2021  INDICATIONS: Stroke  PROCEDURE:   Informed consent was obtained prior to the procedure. The risks, benefits and alternatives for the procedure were discussed and the patient comprehended these risks.  Risks include, but are not limited to, cough, sore throat, vomiting, nausea, somnolence, esophageal and stomach trauma or perforation, bleeding, low blood pressure, aspiration, pneumonia, infection, trauma to the teeth and death.    Procedural time out performed. The oropharynx was anesthetized with topical 1% benzocaine.    Anesthesia was administered by Dr. Gabriel Carina.  The patient was administered 120 mg of propofol and 0 mg of lidocaine to achieve and maintain moderate conscious sedation.  The patient's heart rate, blood pressure, and oxygen saturation are monitored continuously during the procedure. The period of conscious sedation is 10 minutes, of which I was present face-to-face 100% of this time.   The transesophageal probe was inserted in the esophagus and stomach without difficulty and multiple views were obtained.   COMPLICATIONS:    There were no immediate complications.  KEY FINDINGS:  Normal LV/RV function.  No PFO by bubble.  No LAA thrombus.   Full report to follow. Further management per primary team.   Lake Bells T. Audie Box, MD, [place]  [address], [place], [place] [address] [phone]  11:59 AM

## 2021-07-10 NOTE — Interval H&P Note (Signed)
History and Physical Interval Note:  07/10/2021 10:56 AM  Lori Fleming  has presented today for surgery, with the diagnosis of history of CVA.  The various methods of treatment have been discussed with the patient and family. After consideration of risks, benefits and other options for treatment, the patient has consented to  Procedure(s): TRANSESOPHAGEAL ECHOCARDIOGRAM (TEE) (N/A) as a surgical intervention.  The patient's history has been reviewed, patient examined, no change in status, stable for surgery.  I have reviewed the patient's chart and labs.  Questions were answered to the patient's satisfaction.     NPO for TEE with bubble for CVA work-up.   Shared Decision Making/Informed Consent The risks [esophageal damage, perforation (1:10,000 risk), bleeding, pharyngeal hematoma as well as other potential complications associated with conscious sedation including aspiration, arrhythmia, respiratory failure and death], benefits (treatment guidance and diagnostic support) and alternatives of a transesophageal echocardiogram were discussed in detail with Lori Fleming and she is willing to proceed.   Lake Bells T. Audie Box, MD, [place]  [address] [place], [place] [address] [phone]  10:56 AM

## 2021-07-10 NOTE — Transfer of Care (Signed)
Immediate Anesthesia Transfer of Care Note  Patient: Lori Fleming  Procedure(s) Performed: TRANSESOPHAGEAL ECHOCARDIOGRAM (TEE)  Patient Location: PACU  Anesthesia Type:General  Level of Consciousness: awake  Airway & Oxygen Therapy: Patient Spontanous Breathing and Patient connected to nasal cannula oxygen  Post-op Assessment: Report given to RN and Post -op Vital signs reviewed and stable  Post vital signs: Reviewed and stable  Last Vitals:  Vitals Value Taken Time  BP 154/80   Temp    Pulse 70   Resp 18   SpO2 100%     Last Pain:  Vitals:   07/10/21 0938  TempSrc: Oral  PainSc: 0-No pain      Patients Stated Pain Goal: 7 ([id number])  Complications: No notable events documented.

## 2021-07-11 ENCOUNTER — Encounter (HOSPITAL_COMMUNITY): Payer: BC Managed Care – PPO | Admitting: Physical Therapy

## 2021-07-12 ENCOUNTER — Encounter (HOSPITAL_COMMUNITY): Payer: Self-pay | Admitting: Cardiovascular Disease

## 2021-07-13 ENCOUNTER — Encounter (HOSPITAL_COMMUNITY): Payer: BC Managed Care – PPO

## 2021-07-18 ENCOUNTER — Encounter (HOSPITAL_COMMUNITY): Payer: BC Managed Care – PPO

## 2021-07-21 ENCOUNTER — Ambulatory Visit: Payer: BC Managed Care – PPO | Admitting: Internal Medicine

## 2021-07-21 ENCOUNTER — Encounter (HOSPITAL_COMMUNITY): Payer: BC Managed Care – PPO

## 2021-07-24 ENCOUNTER — Other Ambulatory Visit: Payer: Self-pay

## 2021-07-24 ENCOUNTER — Ambulatory Visit (INDEPENDENT_AMBULATORY_CARE_PROVIDER_SITE_OTHER): Payer: BC Managed Care – PPO | Admitting: Internal Medicine

## 2021-07-24 ENCOUNTER — Encounter: Payer: Self-pay | Admitting: Internal Medicine

## 2021-07-24 DIAGNOSIS — I10 Essential (primary) hypertension: Secondary | ICD-10-CM

## 2021-07-24 NOTE — Patient Instructions (Addendum)
We will keep the medicine the same.  Make sure to do the monitor. If the blood pressure is still high at the cardiologist visit we can adjust the medicines.

## 2021-07-24 NOTE — Progress Notes (Signed)
   Subjective:   Patient ID: Lori Fleming, female    DOB: 03-23-1971, 51 y.o.   MRN: [id number]  HPI The patient is a 50 YO female coming in for blood pressure follow up. Started atenolol about 3 weeks ago. Overall readings are down at home. Still elevated slightly at doctor's offices. She is not having any symptoms.   Review of Systems  Constitutional: Negative.   HENT: Negative.    Eyes: Negative.   Respiratory:  Negative for cough, chest tightness and shortness of breath.   Cardiovascular:  Negative for chest pain, palpitations and leg swelling.  Gastrointestinal:  Negative for abdominal distention, abdominal pain, constipation, diarrhea, nausea and vomiting.  Musculoskeletal: Negative.   Skin: Negative.   Neurological: Negative.   Psychiatric/Behavioral: Negative.     Objective:  Physical Exam Constitutional:      Appearance: She is well-developed.  HENT:     Head: Normocephalic and atraumatic.  Cardiovascular:     Rate and Rhythm: Normal rate and regular rhythm.  Pulmonary:     Effort: Pulmonary effort is normal. No respiratory distress.     Breath sounds: Normal breath sounds. No wheezing or rales.  Abdominal:     General: Bowel sounds are normal. There is no distension.     Palpations: Abdomen is soft.     Tenderness: There is no abdominal tenderness. There is no rebound.  Musculoskeletal:     Cervical back: Normal range of motion.  Skin:    General: Skin is warm and dry.  Neurological:     Mental Status: She is alert and oriented to person, place, and time.     Coordination: Coordination normal.    Vitals:   07/24/21 1552  BP: 126/90  Pulse: 68  Resp: 18  SpO2: 94%  Weight: 155 lb 12.8 oz (70.7 kg)  Height: 5\' 6"  (1.676 m)    This visit occurred during the SARS-CoV-2 public health emergency.  Safety protocols were in place, including screening questions prior to the visit, additional usage of staff PPE, and extensive cleaning of exam room while  observing appropriate contact time as indicated for disinfecting solutions.   Assessment & Plan:

## 2021-07-25 NOTE — Assessment & Plan Note (Signed)
BP is still above goal of <130/80 today and she is taking losartan/hctz 100/25 and atenolol 25 mg daily. We could add back amlodipine if needed next. She is not sure about adding another medication today since home readings are closer to goal.

## 2021-08-02 ENCOUNTER — Telehealth: Payer: Self-pay | Admitting: *Deleted

## 2021-08-02 NOTE — Telephone Encounter (Signed)
Received a fax from [facility] stating that patient's study has been cancelled per request of patient or practice.

## 2021-08-31 ENCOUNTER — Ambulatory Visit: Payer: BC Managed Care – PPO | Admitting: Cardiology

## 2021-10-04 ENCOUNTER — Ambulatory Visit: Payer: 59 | Admitting: Neurology

## 2021-10-18 ENCOUNTER — Other Ambulatory Visit: Payer: Self-pay | Admitting: Internal Medicine

## 2021-11-10 ENCOUNTER — Other Ambulatory Visit: Payer: Self-pay | Admitting: Internal Medicine

## 2021-12-26 ENCOUNTER — Other Ambulatory Visit: Payer: Self-pay | Admitting: Internal Medicine

## 2022-03-07 DIAGNOSIS — Z0131 Encounter for examination of blood pressure with abnormal findings: Secondary | ICD-10-CM | POA: Diagnosis not present

## 2022-03-07 DIAGNOSIS — Z6825 Body mass index (BMI) 25.0-25.9, adult: Secondary | ICD-10-CM | POA: Diagnosis not present

## 2022-03-07 DIAGNOSIS — Z131 Encounter for screening for diabetes mellitus: Secondary | ICD-10-CM | POA: Diagnosis not present

## 2022-03-07 DIAGNOSIS — Z13220 Encounter for screening for lipoid disorders: Secondary | ICD-10-CM | POA: Diagnosis not present

## 2022-03-07 DIAGNOSIS — I10 Essential (primary) hypertension: Secondary | ICD-10-CM | POA: Diagnosis not present

## 2022-03-18 ENCOUNTER — Emergency Department (HOSPITAL_COMMUNITY)
Admission: EM | Admit: 2022-03-18 | Discharge: 2022-03-19 | Disposition: A | Discharge status: Home / Self Care | Payer: BC Managed Care – PPO | Attending: Emergency Medicine | Admitting: Emergency Medicine

## 2022-03-18 ENCOUNTER — Encounter (HOSPITAL_COMMUNITY): Payer: Self-pay

## 2022-03-18 ENCOUNTER — Other Ambulatory Visit: Payer: Self-pay

## 2022-03-18 DIAGNOSIS — D649 Anemia, unspecified: Secondary | ICD-10-CM | POA: Diagnosis not present

## 2022-03-18 DIAGNOSIS — Z79899 Other long term (current) drug therapy: Secondary | ICD-10-CM | POA: Diagnosis not present

## 2022-03-18 DIAGNOSIS — Z7982 Long term (current) use of aspirin: Secondary | ICD-10-CM | POA: Diagnosis not present

## 2022-03-18 DIAGNOSIS — H5712 Ocular pain, left eye: Secondary | ICD-10-CM | POA: Diagnosis not present

## 2022-03-18 DIAGNOSIS — H401122 Primary open-angle glaucoma, left eye, moderate stage: Secondary | ICD-10-CM | POA: Diagnosis not present

## 2022-03-18 DIAGNOSIS — H538 Other visual disturbances: Secondary | ICD-10-CM | POA: Insufficient documentation

## 2022-03-18 DIAGNOSIS — D75839 Thrombocytosis, unspecified: Secondary | ICD-10-CM

## 2022-03-18 DIAGNOSIS — H40052 Ocular hypertension, left eye: Secondary | ICD-10-CM

## 2022-03-18 DIAGNOSIS — H409 Unspecified glaucoma: Secondary | ICD-10-CM

## 2022-03-18 DIAGNOSIS — N289 Disorder of kidney and ureter, unspecified: Secondary | ICD-10-CM

## 2022-03-18 DIAGNOSIS — D75838 Other thrombocytosis: Secondary | ICD-10-CM | POA: Diagnosis not present

## 2022-03-18 DIAGNOSIS — I10 Essential (primary) hypertension: Secondary | ICD-10-CM | POA: Diagnosis not present

## 2022-03-18 MED ORDER — ACETAZOLAMIDE SODIUM 500 MG IJ SOLR
500.0000 mg | Freq: Once | INTRAMUSCULAR | Status: AC
  Administered 2022-03-18: 500 mg via INTRAVENOUS
  Filled 2022-03-18: qty 500

## 2022-03-18 MED ORDER — TIMOLOL MALEATE 0.5 % OP SOLN
1.0000 [drp] | Freq: Once | OPHTHALMIC | Status: AC
  Administered 2022-03-18: 1 [drp] via OPHTHALMIC
  Filled 2022-03-18: qty 5

## 2022-03-18 MED ORDER — PREDNISOLONE ACETATE 1 % OP SUSP
2.0000 [drp] | OPHTHALMIC | Status: DC
  Administered 2022-03-18: 2 [drp] via OPHTHALMIC
  Filled 2022-03-18: qty 1

## 2022-03-18 MED ORDER — METOPROLOL TARTRATE 5 MG/5ML IV SOLN: 5.0000 mg | Freq: Once | INTRAVENOUS | Status: DC

## 2022-03-18 NOTE — ED Notes (Signed)
[facility] paged to Dr Clayborne Dana @ [phone]

## 2022-03-19 DIAGNOSIS — E1169 Type 2 diabetes mellitus with other specified complication: Secondary | ICD-10-CM | POA: Diagnosis not present

## 2022-03-19 DIAGNOSIS — H53132 Sudden visual loss, left eye: Secondary | ICD-10-CM | POA: Diagnosis not present

## 2022-03-19 DIAGNOSIS — E1165 Type 2 diabetes mellitus with hyperglycemia: Secondary | ICD-10-CM | POA: Diagnosis not present

## 2022-03-19 DIAGNOSIS — H3582 Retinal ischemia: Secondary | ICD-10-CM | POA: Diagnosis not present

## 2022-03-19 DIAGNOSIS — E1122 Type 2 diabetes mellitus with diabetic chronic kidney disease: Secondary | ICD-10-CM | POA: Diagnosis not present

## 2022-03-19 DIAGNOSIS — H4052X3 Glaucoma secondary to other eye disorders, left eye, severe stage: Secondary | ICD-10-CM | POA: Diagnosis not present

## 2022-03-19 DIAGNOSIS — Z8249 Family history of ischemic heart disease and other diseases of the circulatory system: Secondary | ICD-10-CM | POA: Diagnosis not present

## 2022-03-19 DIAGNOSIS — Z7982 Long term (current) use of aspirin: Secondary | ICD-10-CM | POA: Diagnosis not present

## 2022-03-19 DIAGNOSIS — I120 Hypertensive chronic kidney disease with stage 5 chronic kidney disease or end stage renal disease: Secondary | ICD-10-CM | POA: Diagnosis not present

## 2022-03-19 DIAGNOSIS — E118 Type 2 diabetes mellitus with unspecified complications: Secondary | ICD-10-CM | POA: Diagnosis not present

## 2022-03-19 DIAGNOSIS — E785 Hyperlipidemia, unspecified: Secondary | ICD-10-CM | POA: Diagnosis not present

## 2022-03-19 DIAGNOSIS — R519 Headache, unspecified: Secondary | ICD-10-CM | POA: Diagnosis not present

## 2022-03-19 DIAGNOSIS — N186 End stage renal disease: Secondary | ICD-10-CM | POA: Diagnosis not present

## 2022-03-19 DIAGNOSIS — H25012 Cortical age-related cataract, left eye: Secondary | ICD-10-CM | POA: Diagnosis not present

## 2022-03-19 DIAGNOSIS — H409 Unspecified glaucoma: Secondary | ICD-10-CM | POA: Diagnosis not present

## 2022-03-19 DIAGNOSIS — Z79899 Other long term (current) drug therapy: Secondary | ICD-10-CM | POA: Diagnosis not present

## 2022-03-19 DIAGNOSIS — G8929 Other chronic pain: Secondary | ICD-10-CM | POA: Diagnosis not present

## 2022-03-19 DIAGNOSIS — Z7984 Long term (current) use of oral hypoglycemic drugs: Secondary | ICD-10-CM | POA: Diagnosis not present

## 2022-03-19 DIAGNOSIS — E113512 Type 2 diabetes mellitus with proliferative diabetic retinopathy with macular edema, left eye: Secondary | ICD-10-CM | POA: Diagnosis not present

## 2022-03-19 DIAGNOSIS — Z8673 Personal history of transient ischemic attack (TIA), and cerebral infarction without residual deficits: Secondary | ICD-10-CM | POA: Diagnosis not present

## 2022-03-19 DIAGNOSIS — H40052 Ocular hypertension, left eye: Secondary | ICD-10-CM | POA: Diagnosis not present

## 2022-03-19 DIAGNOSIS — H40212 Acute angle-closure glaucoma, left eye: Secondary | ICD-10-CM | POA: Diagnosis not present

## 2022-03-19 DIAGNOSIS — N179 Acute kidney failure, unspecified: Secondary | ICD-10-CM | POA: Diagnosis not present

## 2022-03-19 DIAGNOSIS — E871 Hypo-osmolality and hyponatremia: Secondary | ICD-10-CM | POA: Diagnosis not present

## 2022-03-19 DIAGNOSIS — D649 Anemia, unspecified: Secondary | ICD-10-CM | POA: Diagnosis not present

## 2022-03-19 DIAGNOSIS — R319 Hematuria, unspecified: Secondary | ICD-10-CM | POA: Diagnosis not present

## 2022-03-19 DIAGNOSIS — H2513 Age-related nuclear cataract, bilateral: Secondary | ICD-10-CM | POA: Diagnosis not present

## 2022-03-19 DIAGNOSIS — H538 Other visual disturbances: Secondary | ICD-10-CM | POA: Diagnosis not present

## 2022-03-19 DIAGNOSIS — E113593 Type 2 diabetes mellitus with proliferative diabetic retinopathy without macular edema, bilateral: Secondary | ICD-10-CM | POA: Diagnosis not present

## 2022-03-19 DIAGNOSIS — H4089 Other specified glaucoma: Secondary | ICD-10-CM | POA: Diagnosis not present

## 2022-03-19 DIAGNOSIS — H5712 Ocular pain, left eye: Secondary | ICD-10-CM | POA: Diagnosis not present

## 2022-03-19 DIAGNOSIS — E113591 Type 2 diabetes mellitus with proliferative diabetic retinopathy without macular edema, right eye: Secondary | ICD-10-CM | POA: Diagnosis not present

## 2022-03-19 DIAGNOSIS — Z833 Family history of diabetes mellitus: Secondary | ICD-10-CM | POA: Diagnosis not present

## 2022-03-19 DIAGNOSIS — I10 Essential (primary) hypertension: Secondary | ICD-10-CM | POA: Diagnosis not present

## 2022-03-19 DIAGNOSIS — G4452 New daily persistent headache (NDPH): Secondary | ICD-10-CM | POA: Diagnosis not present

## 2022-03-19 MED ORDER — BRIMONIDINE TARTRATE 0.2 % OP SOLN
1.0000 [drp] | Freq: Three times a day (TID) | OPHTHALMIC | Status: DC
  Administered 2022-03-19: 1 [drp] via OPHTHALMIC
  Filled 2022-03-19: qty 5

## 2022-03-19 MED ORDER — BRIMONIDINE TARTRATE 0.2 % OP SOLN: 1.0000 [drp] | Freq: Three times a day (TID) | OPHTHALMIC | 12 refills | Status: AC

## 2022-03-19 MED ORDER — TIMOLOL MALEATE 0.5 % OP SOLN: 1.0000 [drp] | Freq: Two times a day (BID) | OPHTHALMIC | 0 refills | Status: DC

## 2022-03-19 MED ORDER — ACETAZOLAMIDE ER 500 MG PO CP12: 500.0000 mg | ORAL_CAPSULE | Freq: Two times a day (BID) | ORAL | 0 refills | Status: DC

## 2022-03-19 MED ORDER — HYDROCODONE-ACETAMINOPHEN 5-325 MG PO TABS
1.0000 | ORAL_TABLET | Freq: Once | ORAL | Status: AC
  Administered 2022-03-19: 1 via ORAL
  Filled 2022-03-19: qty 1

## 2022-03-19 NOTE — ED Notes (Signed)
E-signature pad unavailable at time of pt discharge. This RN discussed discharge materials with pt and answered all pt questions. Pt stated understanding of discharge material. ? ?

## 2022-03-20 DIAGNOSIS — E113512 Type 2 diabetes mellitus with proliferative diabetic retinopathy with macular edema, left eye: Secondary | ICD-10-CM | POA: Diagnosis not present

## 2022-03-20 DIAGNOSIS — H40052 Ocular hypertension, left eye: Secondary | ICD-10-CM | POA: Diagnosis not present

## 2022-03-20 DIAGNOSIS — H4089 Other specified glaucoma: Secondary | ICD-10-CM | POA: Diagnosis not present

## 2022-03-20 DIAGNOSIS — H4052X3 Glaucoma secondary to other eye disorders, left eye, severe stage: Secondary | ICD-10-CM | POA: Diagnosis not present

## 2022-03-20 DIAGNOSIS — H3582 Retinal ischemia: Secondary | ICD-10-CM | POA: Diagnosis not present

## 2022-03-20 DIAGNOSIS — E113593 Type 2 diabetes mellitus with proliferative diabetic retinopathy without macular edema, bilateral: Secondary | ICD-10-CM | POA: Diagnosis not present

## 2022-03-20 DIAGNOSIS — H2513 Age-related nuclear cataract, bilateral: Secondary | ICD-10-CM | POA: Diagnosis not present

## 2022-03-20 DIAGNOSIS — H53132 Sudden visual loss, left eye: Secondary | ICD-10-CM | POA: Diagnosis not present

## 2022-03-20 DIAGNOSIS — E113591 Type 2 diabetes mellitus with proliferative diabetic retinopathy without macular edema, right eye: Secondary | ICD-10-CM | POA: Diagnosis not present

## 2022-03-20 DIAGNOSIS — H25012 Cortical age-related cataract, left eye: Secondary | ICD-10-CM | POA: Diagnosis not present

## 2022-03-22 ENCOUNTER — Ambulatory Visit (INDEPENDENT_AMBULATORY_CARE_PROVIDER_SITE_OTHER): Payer: BC Managed Care – PPO | Admitting: Internal Medicine

## 2022-03-22 ENCOUNTER — Telehealth: Payer: Self-pay

## 2022-03-22 ENCOUNTER — Emergency Department (HOSPITAL_COMMUNITY): Payer: BC Managed Care – PPO

## 2022-03-22 ENCOUNTER — Encounter: Payer: Self-pay | Admitting: Internal Medicine

## 2022-03-22 ENCOUNTER — Other Ambulatory Visit: Payer: Self-pay

## 2022-03-22 ENCOUNTER — Inpatient Hospital Stay (HOSPITAL_COMMUNITY)
Admission: EM | Admit: 2022-03-22 | Discharge: 2022-03-25 | DRG: 683 | Disposition: A | Discharge status: Home / Self Care | Payer: BC Managed Care – PPO | Attending: Internal Medicine | Admitting: Internal Medicine

## 2022-03-22 ENCOUNTER — Encounter (HOSPITAL_COMMUNITY): Payer: Self-pay

## 2022-03-22 VITALS — BP 124/80 | HR 61 | Resp 18 | Ht 66.0 in | Wt 145.4 lb

## 2022-03-22 DIAGNOSIS — E1122 Type 2 diabetes mellitus with diabetic chronic kidney disease: Secondary | ICD-10-CM | POA: Diagnosis present

## 2022-03-22 DIAGNOSIS — E118 Type 2 diabetes mellitus with unspecified complications: Secondary | ICD-10-CM

## 2022-03-22 DIAGNOSIS — E871 Hypo-osmolality and hyponatremia: Secondary | ICD-10-CM | POA: Diagnosis present

## 2022-03-22 DIAGNOSIS — I120 Hypertensive chronic kidney disease with stage 5 chronic kidney disease or end stage renal disease: Secondary | ICD-10-CM | POA: Diagnosis present

## 2022-03-22 DIAGNOSIS — D649 Anemia, unspecified: Secondary | ICD-10-CM

## 2022-03-22 DIAGNOSIS — E785 Hyperlipidemia, unspecified: Secondary | ICD-10-CM

## 2022-03-22 DIAGNOSIS — N186 End stage renal disease: Secondary | ICD-10-CM | POA: Diagnosis present

## 2022-03-22 DIAGNOSIS — N179 Acute kidney failure, unspecified: Principal | ICD-10-CM | POA: Diagnosis present

## 2022-03-22 DIAGNOSIS — Z8673 Personal history of transient ischemic attack (TIA), and cerebral infarction without residual deficits: Secondary | ICD-10-CM

## 2022-03-22 DIAGNOSIS — H40212 Acute angle-closure glaucoma, left eye: Secondary | ICD-10-CM

## 2022-03-22 DIAGNOSIS — I10 Essential (primary) hypertension: Secondary | ICD-10-CM | POA: Diagnosis present

## 2022-03-22 DIAGNOSIS — Z8249 Family history of ischemic heart disease and other diseases of the circulatory system: Secondary | ICD-10-CM | POA: Diagnosis not present

## 2022-03-22 DIAGNOSIS — H409 Unspecified glaucoma: Secondary | ICD-10-CM

## 2022-03-22 DIAGNOSIS — E1169 Type 2 diabetes mellitus with other specified complication: Secondary | ICD-10-CM

## 2022-03-22 DIAGNOSIS — R519 Headache, unspecified: Secondary | ICD-10-CM

## 2022-03-22 DIAGNOSIS — Z833 Family history of diabetes mellitus: Secondary | ICD-10-CM | POA: Diagnosis not present

## 2022-03-22 DIAGNOSIS — Z79899 Other long term (current) drug therapy: Secondary | ICD-10-CM

## 2022-03-22 DIAGNOSIS — Z7982 Long term (current) use of aspirin: Secondary | ICD-10-CM | POA: Diagnosis not present

## 2022-03-22 DIAGNOSIS — R739 Hyperglycemia, unspecified: Secondary | ICD-10-CM

## 2022-03-22 DIAGNOSIS — E1165 Type 2 diabetes mellitus with hyperglycemia: Secondary | ICD-10-CM | POA: Diagnosis present

## 2022-03-22 DIAGNOSIS — G4452 New daily persistent headache (NDPH): Secondary | ICD-10-CM

## 2022-03-22 DIAGNOSIS — Z7984 Long term (current) use of oral hypoglycemic drugs: Secondary | ICD-10-CM | POA: Diagnosis not present

## 2022-03-22 DIAGNOSIS — G8929 Other chronic pain: Secondary | ICD-10-CM | POA: Diagnosis not present

## 2022-03-22 LAB — CBC WITH DIFFERENTIAL/PLATELET
Abs Immature Granulocytes: 0.02 10*3/uL (ref 0.00–0.07)
Basophils Absolute: 0 10*3/uL (ref 0.0–0.1)
Basophils Relative: 0 %
Eosinophils Absolute: 0 10*3/uL (ref 0.0–0.5)
Eosinophils Relative: 0 %
HCT: 33.7 % — ABNORMAL LOW (ref 36.0–46.0)
Hemoglobin: 11.2 g/dL — ABNORMAL LOW (ref 12.0–15.0)
Immature Granulocytes: 0 %
Lymphocytes Relative: 18 %
Lymphs Abs: 1.3 10*3/uL (ref 0.7–4.0)
MCH: 27.9 pg (ref 26.0–34.0)
MCHC: 33.2 g/dL (ref 30.0–36.0)
MCV: 83.8 fL (ref 80.0–100.0)
Monocytes Absolute: 0.3 10*3/uL (ref 0.1–1.0)
Monocytes Relative: 5 %
Neutro Abs: 5.6 10*3/uL (ref 1.7–7.7)
Neutrophils Relative %: 77 %
Platelets: 512 10*3/uL — ABNORMAL HIGH (ref 150–400)
RBC: 4.02 MIL/uL (ref 3.87–5.11)
RDW: 13.2 % (ref 11.5–15.5)
WBC: 7.3 10*3/uL (ref 4.0–10.5)
nRBC: 0 % (ref 0.0–0.2)

## 2022-03-22 LAB — CBC
HCT: 35 % — ABNORMAL LOW (ref 36.0–46.0)
Hemoglobin: 11.5 g/dL — ABNORMAL LOW (ref 12.0–15.0)
MCHC: 32.9 g/dL (ref 30.0–36.0)
MCV: 84.7 fl (ref 78.0–100.0)
Platelets: 489 10*3/uL — ABNORMAL HIGH (ref 150.0–400.0)
RBC: 4.13 Mil/uL (ref 3.87–5.11)
RDW: 14 % (ref 11.5–15.5)
WBC: 7.8 10*3/uL (ref 4.0–10.5)

## 2022-03-22 LAB — URINALYSIS, ROUTINE W REFLEX MICROSCOPIC
Bilirubin Urine: NEGATIVE
Glucose, UA: NEGATIVE mg/dL
Ketones, ur: 5 mg/dL — AB
Leukocytes,Ua: NEGATIVE
Nitrite: NEGATIVE
Protein, ur: >=300 mg/dL — AB
RBC / HPF: >50 RBC/hpf — ABNORMAL HIGH (ref 0–5)
Specific Gravity, Urine: 1.015 (ref 1.005–1.030)
pH: 5 (ref 5.0–8.0)

## 2022-03-22 LAB — COMPREHENSIVE METABOLIC PANEL
ALT: 13 U/L (ref 0–35)
ALT: 7 U/L (ref 0–44)
AST: 12 U/L (ref 0–37)
AST: 10 U/L — ABNORMAL LOW (ref 15–41)
Albumin: 4.2 g/dL (ref 3.5–5.2)
Albumin: 3.7 g/dL (ref 3.5–5.0)
Alkaline Phosphatase: 54 U/L (ref 39–117)
Alkaline Phosphatase: 49 U/L (ref 38–126)
Anion gap: 14 (ref 5–15)
BUN: 65 mg/dL — ABNORMAL HIGH (ref 6–23)
BUN: 65 mg/dL — ABNORMAL HIGH (ref 6–20)
CO2: 18 mEq/L — ABNORMAL LOW (ref 19–32)
CO2: 16 mmol/L — ABNORMAL LOW (ref 22–32)
Calcium: 9.6 mg/dL (ref 8.4–10.5)
Calcium: 8.6 mg/dL — ABNORMAL LOW (ref 8.9–10.3)
Chloride: 95 mEq/L — ABNORMAL LOW (ref 96–112)
Chloride: 98 mmol/L (ref 98–111)
Creatinine, Ser: 4.62 mg/dL (ref 0.40–1.20)
Creatinine, Ser: 4.86 mg/dL — ABNORMAL HIGH (ref 0.44–1.00)
GFR, Estimated: 10 mL/min — ABNORMAL LOW (ref >60)
GFR: 10.47 mL/min — CL (ref >60.00)
Glucose, Bld: 199 mg/dL — ABNORMAL HIGH (ref 70–99)
Glucose, Bld: 193 mg/dL — ABNORMAL HIGH (ref 70–99)
Potassium: 3.8 mEq/L (ref 3.5–5.1)
Potassium: 3.8 mmol/L (ref 3.5–5.1)
Sodium: 128 mEq/L — ABNORMAL LOW (ref 135–145)
Sodium: 128 mmol/L — ABNORMAL LOW (ref 135–145)
Total Bilirubin: 0.3 mg/dL (ref 0.2–1.2)
Total Bilirubin: 0.5 mg/dL (ref 0.3–1.2)
Total Protein: 7.6 g/dL (ref 6.0–8.3)
Total Protein: 6.9 g/dL (ref 6.5–8.1)

## 2022-03-22 LAB — SEDIMENTATION RATE: Sed Rate: 52 mm/hr — ABNORMAL HIGH (ref 0–30)

## 2022-03-22 LAB — TSH: TSH: 1.18 u[IU]/mL (ref 0.35–5.50)

## 2022-03-22 LAB — SODIUM, URINE, RANDOM: Sodium, Ur: 36 mmol/L

## 2022-03-22 LAB — VITAMIN D 25 HYDROXY (VIT D DEFICIENCY, FRACTURES): VITD: 13.64 ng/mL — ABNORMAL LOW (ref 30.00–100.00)

## 2022-03-22 LAB — MICROALBUMIN / CREATININE URINE RATIO
Creatinine,U: 435.4 mg/dL
Microalb Creat Ratio: 82.5 mg/g — ABNORMAL HIGH (ref 0.0–30.0)
Microalb, Ur: 359.1 mg/dL — ABNORMAL HIGH (ref 0.0–1.9)

## 2022-03-22 LAB — VITAMIN B12: Vitamin B-12: 562 pg/mL (ref 211–911)

## 2022-03-22 MED ORDER — ATENOLOL 50 MG PO TABS
25.0000 mg | ORAL_TABLET | Freq: Every day | ORAL | Status: DC
  Administered 2022-03-23 – 2022-03-25 (×3): 25 mg via ORAL
  Filled 2022-03-22 (×3): qty 1

## 2022-03-22 MED ORDER — BRIMONIDINE TARTRATE 0.2 % OP SOLN
1.0000 [drp] | Freq: Three times a day (TID) | OPHTHALMIC | Status: DC
  Administered 2022-03-23 – 2022-03-25 (×9): 1 [drp] via OPHTHALMIC
  Filled 2022-03-22: qty 5

## 2022-03-22 MED ORDER — TIMOLOL MALEATE 0.5 % OP SOLN
1.0000 [drp] | Freq: Two times a day (BID) | OPHTHALMIC | Status: DC
Start: 2022-03-22 — End: 2022-03-25
  Administered 2022-03-23 – 2022-03-25 (×6): 1 [drp] via OPHTHALMIC
  Filled 2022-03-22: qty 5

## 2022-03-22 MED ORDER — METHOCARBAMOL 500 MG PO TABS: 500.0000 mg | ORAL_TABLET | Freq: Two times a day (BID) | ORAL | 0 refills | Status: DC | PRN

## 2022-03-22 MED ORDER — ROSUVASTATIN CALCIUM 5 MG PO TABS
10.0000 mg | ORAL_TABLET | Freq: Every evening | ORAL | Status: DC
  Administered 2022-03-23 – 2022-03-25 (×3): 10 mg via ORAL
  Filled 2022-03-22 (×3): qty 2

## 2022-03-22 MED ORDER — HEPARIN SODIUM (PORCINE) 5000 UNIT/ML IJ SOLN
5000.0000 [IU] | Freq: Three times a day (TID) | INTRAMUSCULAR | Status: DC
  Administered 2022-03-23 – 2022-03-25 (×8): 5000 [IU] via SUBCUTANEOUS
  Filled 2022-03-22 (×7): qty 1

## 2022-03-22 MED ORDER — LACTATED RINGERS IV SOLN
INTRAVENOUS | Status: DC
Start: 2022-03-22 — End: 2022-03-23

## 2022-03-22 MED ORDER — SODIUM CHLORIDE 0.9 % IV BOLUS
1000.0000 mL | Freq: Once | INTRAVENOUS | Status: AC
  Administered 2022-03-22: 1000 mL via INTRAVENOUS

## 2022-03-22 NOTE — Assessment & Plan Note (Addendum)
Checking microalbumin to creatinine ratio. Recent HgA1c 7.9 she had done which is acceptable. Taking metformin 750 mg daily and on statin and ARB.

## 2022-03-22 NOTE — H&P (Signed)
History and Physical    Patient: Lori Fleming TFT:[id number] DOB: 07/11/1971 DOA: 03/22/2022 DOS: the patient was seen and examined on 03/23/2022 PCP: Hoyt Koch, MD  Patient coming from: Home  Chief Complaint:  Chief Complaint  Patient presents with   Abnormal Lab   HPI: Lori Fleming is a 51 y.o. female with medical history significant of hypertension, CVA, type 2 diabetes, hyperlipidemia and recent acute glaucoma of the left eye who presents for abnormal creatinine level.  She was recently seen at [facility] ED on 6/25 for acute glaucoma of the left eye.  She was evaluated by on-call ophthalmology and discharged with prescription for atenolol, brimonidine eyedrops and acetazolamide.  Today she presents to the ER after blood work with PCP shows elevated creatinine level.  She is also felt decreased urine output since yesterday.  Continues to have left-sided headache and eye pain and reports complete vision loss of left eye.  Has been taking multiple ibuprofen a day for the past week.  Has not taken her eyedrops or acetazolamide since yesterday since she was busy.  In the ED, she was afebrile normotensive on room air. CBC showed no leukocytosis, mild anemia with hemoglobin of 11.2 from prior of 9.3.  Platelets of 512.  CMP with sodium of 128, potassium of 3.8, glucose of 193, creatinine of 4.86 from a prior of 1.2.  GFR less than 10.  Increasing anion gap of 14 compared to prior.  UA showed greater than 300 protein, large hemoglobin with negative leukocyte and nitrite. Renal ultrasound shows increased bilateral renal parenchymal echogenicity consistent with chronic medical renal disease. Review of Systems: As mentioned in the history of present illness. All other systems reviewed and are negative. Past Medical History:  Diagnosis Date   Diabetes mellitus without complication ([place])    Hypertension    Stroke [facility])    Past Surgical History:  Procedure Laterality Date    TEE WITHOUT CARDIOVERSION N/A 07/10/2021   Procedure: TRANSESOPHAGEAL ECHOCARDIOGRAM (TEE);  Surgeon: Geralynn Rile, MD;  Location: [facility];  Service: Cardiovascular;  Laterality: N/A;   Social History:  reports that she has never smoked. She has never used smokeless tobacco. She reports that she does not drink alcohol and does not use drugs.  No Known Allergies  Family History  Problem Relation Age of Onset   Hypertension Father    Diabetes Father    Hypertension Sister    Mental illness Sister    Mental illness Brother    Alcohol abuse Paternal Grandfather    Diabetes Maternal Aunt     Prior to Admission medications   Medication Sig Start Date End Date Taking? Authorizing Provider  acetaZOLAMIDE ER (DIAMOX) 500 MG capsule Take 1 capsule (500 mg total) by mouth 2 (two) times daily. 03/29/22  Yes Delora Fuel, MD  atenolol (TENORMIN) 25 MG tablet TAKE 1 TABLET (25 MG TOTAL) BY MOUTH DAILY. 12/26/21  Yes Hoyt Koch, MD  brimonidine (ALPHAGAN) 0.2 % ophthalmic solution Place 1 drop into the left eye 3 (three) times daily. 7/62/83  Yes Delora Fuel, MD  ibuprofen (ADVIL) 200 MG tablet Take 400 mg by mouth every 6 (six) hours as needed for headache or moderate pain.   Yes [provider]  losartan-hydrochlorothiazide (HYZAAR) 100-25 MG tablet TAKE 1 TABLET BY MOUTH EVERY DAY Patient taking differently: Take 1 tablet by mouth daily. 10/20/21  Yes Hoyt Koch, MD  metFORMIN (GLUCOPHAGE-XR) 750 MG 24 hr tablet TAKE 1 TABLET (750 MG TOTAL)  BY MOUTH 2 (TWO) TIMES DAILY WITH A MEAL. 11/10/21  Yes Hoyt Koch, MD  rosuvastatin (CRESTOR) 10 MG tablet Take 1 tablet (10 mg total) by mouth daily. Patient taking differently: Take 10 mg by mouth every evening. 07/05/21 03/22/22 Yes Donato Heinz, MD  timolol (TIMOPTIC) 0.5 % ophthalmic solution Place 1 drop into the left eye every 12 (twelve) hours. 3/50/09  Yes Delora Fuel, MD  aspirin EC 325 MG EC  tablet Take 1 tablet (325 mg total) by mouth daily. Patient not taking: Reported on 03/19/2022 06/20/21   Heath Lark D, DO  glucose blood [facility] VERIO) test strip Use daily to check sugar 06/29/21   Hoyt Koch, MD  methocarbamol (ROBAXIN) 500 MG tablet Take 1 tablet (500 mg total) by mouth 2 (two) times daily as needed for muscle spasms (headache). Patient not taking: Reported on 03/22/2022 03/22/22   Hoyt Koch, MD    Physical Exam: Vitals:   03/22/22 2145 03/22/22 2150 03/22/22 2200 03/22/22 2215  BP:   137/69   Pulse: 73 75 72 72  Resp: 16 12 17 15   Temp:      TempSrc:      SpO2: 100% 100% 99% 100%  Weight:      Height:       Constitutional: NAD, calm, comfortable, middle-age female appearing much younger than stated age laying in bed asleep Eyes: Pupils not reactive to light in the left eye.  Lids and conjunctivae normal ENMT: Mucous membranes are moist.  Neck: normal, supple, Respiratory: clear to auscultation bilaterally, no wheezing, no crackles. Normal respiratory effort. No accessory muscle use.  Cardiovascular: Regular rate and rhythm, no murmurs / rubs / gallops. No extremity edema. Abdomen: no tenderness, no CVA tenderness, no masses palpated.  Bowel sounds positive.  Musculoskeletal: no clubbing / cyanosis. No joint deformity upper and lower extremities. Good ROM, no contractures. Normal muscle tone.  Skin: no rashes, lesions, ulcers.  Neurologic: CN 2-12 grossly intact.Strength 5/5 in all 4.  Psychiatric: Normal judgment and insight. Alert and oriented x 3. Normal mood. Data Reviewed:  See HPI  Assessment and Plan: * Acute renal failure (ARF) ([place]) Suspect secondary to recent initiation of acetazolamide for acute glaucoma of the left eye and more frequent NSAID use due to headache and eye pain.  She is also on ACE-I and thiazide for HTN. -Presenting creatinine of 4.86 from prior of 1.2. -Renal ultrasound consistent with chronic medical renal  disease. -check FENA labs -continuous IV LR overnight and follow repeat creatinine. Follow intake and output. - Avoid nephrotoxic agent and contrast study  Hyponatremia Sodium of 128 likely secondary to acetazolamide.  Monitor with morning sodium while receiving IV fluids.  Glaucoma Continue timolol and brimonidine ophthalmic drops.  Need to hold acetazolamide due to AKI.  We will need to discuss with ophthalmology in the morning regarding alternative treatments.  Hyperlipidemia associated with type 2 diabetes mellitus ([place]) Continue statin  Type 2 diabetes with complication (H[facility]) Check hemoglobin A1c.  Placed on very sensitive sliding scale insulin.  Essential hypertension Continue atenolol but will need to hold losartan-HCTZ due to AKI      Advance Care Planning:   Code Status: Full Code   Consults: none  Family Communication: No family at bedside  Severity of Illness: The appropriate patient status for this patient is INPATIENT. Inpatient status is judged to be reasonable and necessary in order to provide the required intensity of service to ensure the patient's safety. The patient's  presenting symptoms, physical exam findings, and initial radiographic and laboratory data in the context of their chronic comorbidities is felt to place them at high risk for further clinical deterioration. Furthermore, it is not anticipated that the patient will be medically stable for discharge from the hospital within 2 midnights of admission.   * I certify that at the point of admission it is my clinical judgment that the patient will require inpatient hospital care spanning beyond 2 midnights from the point of admission due to high intensity of service, high risk for further deterioration and high frequency of surveillance required.*  Author: Orene Desanctis, DO 03/23/2022 12:26 AM  For on call review [email or web address].

## 2022-03-22 NOTE — Assessment & Plan Note (Signed)
Checking lipid panel. 

## 2022-03-22 NOTE — Patient Instructions (Signed)
We have sent in methocarbamol to use for headaches if needed up to twice a day.   We are checking the labs today.

## 2022-03-22 NOTE — Progress Notes (Signed)
   Subjective:   Patient ID: Lori Fleming, female    DOB: 01-06-1971, 51 y.o.   MRN: [id number]  HPI The patient is a 51 YO female coming in for headaches and dealing with acute glaucoma and vision loss. Headaches around same time glaucoma started. Nothing helps has been taking ibuprofen for them. Bp okay at visits lately. Last HgA1c 7.9.   Review of Systems  Constitutional: Negative.   HENT: Negative.    Eyes: Negative.   Respiratory:  Negative for cough, chest tightness and shortness of breath.   Cardiovascular:  Negative for chest pain, palpitations and leg swelling.  Gastrointestinal:  Negative for abdominal distention, abdominal pain, constipation, diarrhea, nausea and vomiting.  Musculoskeletal: Negative.   Skin: Negative.   Neurological:  Positive for headaches.  Psychiatric/Behavioral: Negative.      Objective:  Physical Exam Constitutional:      Appearance: She is well-developed.  HENT:     Head: Normocephalic and atraumatic.  Cardiovascular:     Rate and Rhythm: Normal rate and regular rhythm.  Pulmonary:     Effort: Pulmonary effort is normal. No respiratory distress.     Breath sounds: Normal breath sounds. No wheezing or rales.  Abdominal:     General: Bowel sounds are normal. There is no distension.     Palpations: Abdomen is soft.     Tenderness: There is no abdominal tenderness. There is no rebound.  Musculoskeletal:     Cervical back: Normal range of motion.  Skin:    General: Skin is warm and dry.  Neurological:     Mental Status: She is alert and oriented to person, place, and time.     Coordination: Coordination normal.     Vitals:   03/22/22 0840  BP: 124/80  Pulse: 61  Resp: 18  SpO2: 98%  Weight: 145 lb 6.4 oz (66 kg)  Height: 5\' 6"  (1.676 m)    Assessment & Plan:

## 2022-03-22 NOTE — Assessment & Plan Note (Signed)
Checking CMP and CBC. Taking atenolol 25 mg daily and diamox 500 mg BID and losartan/hctz 100/25 mg daily.

## 2022-03-22 NOTE — Telephone Encounter (Signed)
CRITICAL VALUE STICKER  CRITICAL VALUE: Creatinine 4.62, GFR 10.47  RECEIVER (on-site recipient of call): [place], CMA  DATE & TIME NOTIFIED: 03/22/2022 @ 2:23 pm   MESSENGER (representative from lab): [place]  MD NOTIFIED: Jenny Reichmann due to Sharlet Salina being out of office  TIME OF NOTIFICATION: 2:24 pm   RESPONSE:

## 2022-03-22 NOTE — Assessment & Plan Note (Signed)
Continue atenolol but will need to hold losartan-HCTZ due to AKI

## 2022-03-22 NOTE — Assessment & Plan Note (Signed)
Could be related to her recent acute glaucoma but could be temporal arteritis. Checking ESR and CMP given hypertension for end organ damage. Treat as appropriate.

## 2022-03-22 NOTE — ED Triage Notes (Signed)
Pt reports she was sent by her PCP for renal failure, had blood work done today with results of Creatinine 4.2 and GFR 10. Denies hx of renal issues. She reports headache and high blood pressures.

## 2022-03-22 NOTE — Telephone Encounter (Signed)
Spoke with the patient and she verbalized understanding Dr. Gwynn Burly recommendations.

## 2022-03-22 NOTE — ED Provider Notes (Signed)
[name] Provider Note   CSN: [id number] Arrival date & time: 03/22/22  1559     History  Chief Complaint  Patient presents with   Abnormal Lab    Lori Fleming is a 51 y.o. female with medical history significant for chronic headache, hypertension, decreased vision, CVA, DM here for evaluation of abnormal labs.  Patient was seen in the emergency department approximately 3 days ago.  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  Started on eyedrops as well as Diamox.  Patient states when she initially started taking this medication she had increased urination however today has had very little urine output.  She is eating and drinking normally.  She is currently on her menstrual cycle.  She denies any prior history of renal dysfunction.  States she has her chronic headache which she has had over the last few months which is unchanged.  She denies any pain to her eyes, new vision changes, neck pain, fever, chest pain, shortness of breath, abdominal pain, diarrhea or dysuria.  No flank pain, abdominal pain, dysuria or hematuria HPI     Home Medications Prior to Admission medications   Medication Sig Start Date End Date Taking? Authorizing Provider  acetaZOLAMIDE ER (DIAMOX) 500 MG capsule Take 1 capsule (500 mg total) by mouth 2 (two) times daily. 0/86/76   Delora Fuel, MD  aspirin EC 325 MG EC tablet Take 1 tablet (325 mg total) by mouth daily. Patient not taking: Reported on 03/19/2022 06/20/21   Heath Lark D, DO  atenolol (TENORMIN) 25 MG tablet TAKE 1 TABLET (25 MG TOTAL) BY MOUTH DAILY. 12/26/21   Hoyt Koch, MD  brimonidine (ALPHAGAN) 0.2 % ophthalmic solution Place 1 drop into the left eye 3 (three) times daily. 1/95/09   Delora Fuel, MD  glucose blood [facility] VERIO) test strip Use daily to check sugar 06/29/21   Hoyt Koch, MD  ibuprofen (ADVIL) 200 MG tablet Take 400 mg by mouth every 6 (six) hours as needed for headache or moderate pain.    [provider]  losartan-hydrochlorothiazide (HYZAAR) 100-25 MG tablet TAKE 1 TABLET BY MOUTH EVERY DAY Patient taking differently: Take 1 tablet by mouth daily. 10/20/21   Hoyt Koch, MD  metFORMIN (GLUCOPHAGE-XR) 750 MG 24 hr tablet TAKE 1 TABLET (750 MG TOTAL) BY MOUTH 2 (TWO) TIMES DAILY WITH A MEAL. 11/10/21   Hoyt Koch, MD  methocarbamol (ROBAXIN) 500 MG tablet Take 1 tablet (500 mg total) by mouth 2 (two) times daily as needed for muscle spasms (headache). 03/22/22   Hoyt Koch, MD  rosuvastatin (CRESTOR) 10 MG tablet Take 1 tablet (10 mg total) by mouth daily. Patient taking differently: Take 10 mg by mouth every evening. 07/05/21 03/19/22  Donato Heinz, MD  timolol (TIMOPTIC) 0.5 % ophthalmic solution Place 1 drop into the left eye every 12 (twelve) hours. 12/17/69   Delora Fuel, MD      Allergies    Patient has no known allergies.    Review of Systems   Review of Systems  Constitutional: Negative.   HENT: Negative.    Respiratory: Negative.    Cardiovascular: Negative.   Gastrointestinal: Negative.   Genitourinary: Negative.   Musculoskeletal: Negative.   Skin: Negative.   Neurological:  Positive for headaches (chronic).  All other systems reviewed and are negative.   Physical Exam Updated Vital  Signs BP (!) 136/91   Pulse 60   Temp 98.1 F (36.7 C) (Oral)   Resp 18   Ht 5\' 6"  (1.676 m)   Wt 66.2 kg   LMP 03/20/2022   SpO2 100%   BMI 23.57 kg/m  Physical Exam Vitals and nursing note reviewed.  Constitutional:      General: She is not in acute distress.    Appearance: She is well-developed. She is not ill-appearing, toxic-appearing or diaphoretic.  HENT:     Head: Normocephalic and atraumatic.     Nose: Nose normal.     Mouth/Throat:     Mouth: Mucous membranes are moist.   Eyes:     Pupils: Pupils are equal, round, and reactive to light.  Cardiovascular:     Rate and Rhythm: Normal rate.     Pulses: Normal pulses.     Heart sounds: Normal heart sounds.  Pulmonary:     Effort: Pulmonary effort is normal. No respiratory distress.     Breath sounds: Normal breath sounds.  Abdominal:     General: Bowel sounds are normal. There is no distension.     Palpations: Abdomen is soft.     Tenderness: There is no abdominal tenderness. There is no right CVA tenderness, left CVA tenderness, guarding or rebound. Negative signs include Murphy's sign and McBurney's sign.  Musculoskeletal:        General: No swelling, tenderness, deformity or signs of injury. Normal range of motion.     Cervical back: Normal range of motion.     Right lower leg: No edema.     Left lower leg: No edema.  Skin:    General: Skin is warm and dry.     Capillary Refill: Capillary refill takes less than 2 seconds.  Neurological:     General: No focal deficit present.     Mental Status: She is alert and oriented to person, place, and time.     Cranial Nerves: No cranial nerve deficit.     Sensory: No sensory deficit.     Motor: No weakness.  Psychiatric:        Mood and Affect: Mood normal.     ED Results / Procedures / Treatments   Labs (all labs ordered are listed, but only abnormal results are displayed) Labs Reviewed  COMPREHENSIVE METABOLIC PANEL - Abnormal; Notable for the following components:      Result Value   Sodium 128 (*)    CO2 16 (*)    Glucose, Bld 193 (*)    BUN 65 (*)    Creatinine, Ser 4.86 (*)    Calcium 8.6 (*)    AST 10 (*)    GFR, Estimated 10 (*)    All other components within normal limits  CBC WITH DIFFERENTIAL/PLATELET - Abnormal; Notable for the following components:   Hemoglobin 11.2 (*)    HCT 33.7 (*)    Platelets 512 (*)    All other components within normal limits  URINALYSIS, ROUTINE W REFLEX MICROSCOPIC - Abnormal; Notable for the following  components:   APPearance HAZY (*)    Hgb urine dipstick LARGE (*)    Ketones, ur 5 (*)    Protein, ur >=300 (*)    RBC / HPF >50 (*)    Bacteria, UA RARE (*)    All other components within normal limits    EKG None  Radiology US RENAL  Result Date: 03/22/2022 CLINICAL DATA:  Renal failure.  Hematuria. EXAM: RENAL / URINARY  TRACT ULTRASOUND COMPLETE COMPARISON:  None Available. FINDINGS: Right Kidney: Renal measurements: 9.4 x 4.0 x 3.9 cm = volume: 78 mL. Moderately increased renal parenchymal echogenicity noted. No mass or hydronephrosis visualized. Left Kidney: Renal measurements: 8.8 x 5.2 x 3.9 cm = volume: 93 mL. Moderately increased renal parenchymal echogenicity noted. No mass or hydronephrosis visualized. Bladder: Appears normal for degree of bladder distention. Other: None. IMPRESSION: Increased bilateral renal parenchymal echogenicity, consistent with chronic medical renal disease. No evidence of hydronephrosis. Electronically Signed   By: Marlaine Hind M.D.   On: 03/22/2022 18:55    Procedures .Critical Care  Performed by: Nettie Elm, PA-C Authorized by: Nettie Elm, PA-C   Critical care provider statement:    Critical care time (minutes):  35   Critical care was necessary to treat or prevent imminent or life-threatening deterioration of the following conditions:  Renal failure   Critical care was time spent personally by me on the following activities:  Development of treatment plan with patient or surrogate, discussions with consultants, evaluation of patient's response to treatment, examination of patient, ordering and review of laboratory studies, ordering and review of radiographic studies, ordering and performing treatments and interventions, pulse oximetry, re-evaluation of patient's condition and review of old charts     Medications Ordered in ED Medications  sodium chloride 0.9 % bolus 1,000 mL (has no administration in time range)    ED Course/  Medical Decision Making/ A&P    51 year old history of diabetes, hypertension, chronic headache here for evaluation of abnormal lab.  Seen in ED 3 days ago for possible angle-closure glaucoma due to neovascularization from diabetic retinopathy.  Ophthalmology saw patient at that time as well as outpatient started her on Diamox as well as some eyedrops.  States she does have chronic headaches however this is unchanged.  No vision changes, numbness or weakness.  Told by PCP in follow-up today she had abnormal labs come to the emergency department.  She does admit to increased urinary frequency after starting her new medications however today has had decreased urine output.  She admits to normal p.o. intake.  Has never been told she had any problems with her kidneys previously.  She is currently on her menstrual cycle.   Labs and imaging personally viewed and interpreted:  Ultrasound renal with likely medical renal disease UA negative for infection, does show blood however patient on menstrual cycle CBC without leukocytosis, hemoglobin 11.2 similar to prior lab Metabolic panel sodium 970, glucose 193, creatinine 4.86 baseline at 1.2 EKG without ischemic change   I suspect patient's new acute renal failure likely due to medications.  Will need admission for further work-up.  We will get ultrasound renal given her known history of severe hypertension as well.  No flank pain, abdominal pain, dysuria or to suggest renal stone as cause of her acute renal failure.  Needing empty bladder, low suspicion for obstructive uropathy  Otherwise the patient's chronic headache she has a nonfocal neuro exam without deficits.  I have low suspicion for CVA, ICH, bleed.  She has no current eye pain and states her vision changes are at her baseline low suspicion for acute angle glaucoma.  CONSULT with Dr. Flossie Buffy with [facility] is agreeable to evaluate patient for admission  The patient appears reasonably stabilized for  admission considering the current resources, flow, and capabilities available in the ED at this time, and I doubt any other [facility] requiring further screening and/or treatment in the ED prior to admission.  Medical Decision Making Amount and/or Complexity of Data Reviewed Independent Historian: spouse External Data Reviewed: labs, radiology, ECG and notes. Labs: ordered. Decision-making details documented in ED Course. Radiology: ordered and independent interpretation performed. Decision-making details documented in ED Course. ECG/medicine tests: ordered and independent interpretation performed. Decision-making details documented in ED Course.  Risk OTC drugs. Prescription drug management. Parenteral controlled substances. Decision regarding hospitalization. Diagnosis or treatment significantly limited by social determinants of health.          Final Clinical Impression(s) / ED Diagnoses Final diagnoses:  Acute renal failure, unspecified acute renal failure type ([place])  Chronic nonintractable headache, unspecified headache type  Hyperglycemia  Hyponatremia    Rx / DC Orders ED Discharge Orders     None         Niamh Rada A, PA-C 03/22/22 2012    Carmin Muskrat, MD 03/22/22 2158

## 2022-03-22 NOTE — Assessment & Plan Note (Addendum)
Suspect secondary to recent initiation of acetazolamide for acute glaucoma of the left eye and more frequent NSAID use due to headache and eye pain.  She is also on ACE-I and thiazide for HTN. -Presenting creatinine of 4.86 from prior of 1.2. -Renal ultrasound consistent with chronic medical renal disease. -check FENA labs -continuous IV LR overnight and follow repeat creatinine. Follow intake and output. - Avoid nephrotoxic agent and contrast study

## 2022-03-22 NOTE — Telephone Encounter (Signed)
Pt needs to go to ED for sudden acute renal failure please

## 2022-03-22 NOTE — ED Provider Triage Note (Signed)
Emergency Medicine Provider Triage Evaluation Note  Coleen Cardiff , a 51 y.o. female  was evaluated in triage.  Pt complains of abnormal labs.  Patient was seen by primary care earlier today as part of a routine visit to check blood pressure.  Patient was seen in the emergency department approximately 4 days ago with concerns about acute glaucoma.  She had elevated blood pressures at that time which supposedly normalized prior to her discharge.  Patient states that her follow-up appointment was today.  Her blood work showed a creatinine level of 4.62.  Patient has no history of renal failure.  Patient has no complaints at this time.  Denies any recent illness other than the eye complaint earlier in the week.  Review of Systems  Positive: Abnormal lab Negative: Chest pain, hematuria, dysuria, abdominal pain  Physical Exam  BP 114/73   Pulse 68   Temp 98.1 F (36.7 C) (Oral)   Resp 18   Ht 5\' 6"  (1.676 m)   Wt 66.2 kg   LMP 03/20/2022   SpO2 100%   BMI 23.57 kg/m  Gen:   Awake, no distress   Resp:  Normal effort  MSK:   Moves extremities without difficulty  Other:    Medical Decision Making  Medically screening exam initiated at 4:10 PM.  Appropriate orders placed.  Darsha Zumstein was informed that the remainder of the evaluation will be completed by another provider, this initial triage assessment does not replace that evaluation, and the importance of remaining in the ED until their evaluation is complete.     Dorothyann Peng, [place] 03/22/22 [id number]

## 2022-03-23 DIAGNOSIS — I10 Essential (primary) hypertension: Secondary | ICD-10-CM | POA: Diagnosis not present

## 2022-03-23 DIAGNOSIS — N179 Acute kidney failure, unspecified: Secondary | ICD-10-CM | POA: Diagnosis not present

## 2022-03-23 LAB — GLUCOSE, CAPILLARY
Glucose-Capillary: 126 mg/dL — ABNORMAL HIGH (ref 70–99)
Glucose-Capillary: 168 mg/dL — ABNORMAL HIGH (ref 70–99)
Glucose-Capillary: 121 mg/dL — ABNORMAL HIGH (ref 70–99)

## 2022-03-23 LAB — CBC
HCT: 31.3 % — ABNORMAL LOW (ref 36.0–46.0)
Hemoglobin: 11 g/dL — ABNORMAL LOW (ref 12.0–15.0)
MCH: 28.7 pg (ref 26.0–34.0)
MCHC: 35.1 g/dL (ref 30.0–36.0)
MCV: 81.7 fL (ref 80.0–100.0)
Platelets: 465 10*3/uL — ABNORMAL HIGH (ref 150–400)
RBC: 3.83 MIL/uL — ABNORMAL LOW (ref 3.87–5.11)
RDW: 13.1 % (ref 11.5–15.5)
WBC: 7 10*3/uL (ref 4.0–10.5)
nRBC: 0 % (ref 0.0–0.2)

## 2022-03-23 LAB — BASIC METABOLIC PANEL
Anion gap: 10 (ref 5–15)
BUN: 64 mg/dL — ABNORMAL HIGH (ref 6–20)
CO2: 17 mmol/L — ABNORMAL LOW (ref 22–32)
Calcium: 8.5 mg/dL — ABNORMAL LOW (ref 8.9–10.3)
Chloride: 108 mmol/L (ref 98–111)
Creatinine, Ser: 4.33 mg/dL — ABNORMAL HIGH (ref 0.44–1.00)
GFR, Estimated: 12 mL/min — ABNORMAL LOW (ref >60)
Glucose, Bld: 113 mg/dL — ABNORMAL HIGH (ref 70–99)
Potassium: 3.1 mmol/L — ABNORMAL LOW (ref 3.5–5.1)
Sodium: 135 mmol/L (ref 135–145)

## 2022-03-23 LAB — CBG MONITORING, ED: Glucose-Capillary: 123 mg/dL — ABNORMAL HIGH (ref 70–99)

## 2022-03-23 MED ORDER — LACTATED RINGERS IV SOLN: INTRAVENOUS | Status: DC

## 2022-03-23 MED ORDER — HYDROCODONE-ACETAMINOPHEN 5-325 MG PO TABS: 1.0000 | ORAL_TABLET | Freq: Four times a day (QID) | ORAL | Status: AC | PRN

## 2022-03-23 MED ORDER — POTASSIUM CHLORIDE CRYS ER 20 MEQ PO TBCR
60.0000 meq | EXTENDED_RELEASE_TABLET | Freq: Once | ORAL | Status: AC
  Administered 2022-03-23: 60 meq via ORAL
  Filled 2022-03-23: qty 3

## 2022-03-23 MED ORDER — ACETAMINOPHEN 325 MG PO TABS
650.0000 mg | ORAL_TABLET | Freq: Four times a day (QID) | ORAL | Status: DC | PRN
Start: 2022-03-23 — End: 2022-03-25
  Administered 2022-03-24: 650 mg via ORAL
  Filled 2022-03-23: qty 2

## 2022-03-23 MED ORDER — INSULIN ASPART 100 UNIT/ML IJ SOLN
0.0000 [IU] | Freq: Three times a day (TID) | INTRAMUSCULAR | Status: DC
  Administered 2022-03-23 – 2022-03-25 (×5): 1 [IU] via SUBCUTANEOUS

## 2022-03-23 NOTE — ED Notes (Signed)
Received verbal report from Amy K RN at this time 

## 2022-03-23 NOTE — Assessment & Plan Note (Signed)
Continue timolol and brimonidine ophthalmic drops.  Need to hold acetazolamide due to AKI.  We will need to discuss with ophthalmology in the morning regarding alternative treatments.

## 2022-03-23 NOTE — Assessment & Plan Note (Signed)
Sodium of 128 likely secondary to acetazolamide.  Monitor with morning sodium while receiving IV fluids.

## 2022-03-23 NOTE — Hospital Course (Signed)
51 y.o. female with medical history significant of hypertension, CVA, type 2 diabetes, hyperlipidemia and recent acute glaucoma of the left eye who presents for abnormal creatinine level.   She was recently seen at [facility] ED on 6/25 for acute glaucoma of the left eye.  She was evaluated by on-call ophthalmology and discharged with prescription for atenolol, brimonidine eyedrops and acetazolamide.   Today she presents to the ER after blood work with PCP shows elevated creatinine level.  She is also felt decreased urine output since yesterday.  Continues to have left-sided headache and eye pain and reports complete vision loss of left eye.  Has been taking multiple ibuprofen a day for the past week.  Has not taken her eyedrops or acetazolamide since yesterday since she was busy.   In the ED, she was afebrile normotensive on room air. CBC showed no leukocytosis, mild anemia with hemoglobin of 11.2 from prior of 9.3.  Platelets of 512.  CMP with sodium of 128, potassium of 3.8, glucose of 193, creatinine of 4.86 from a prior of 1.2.  GFR less than 10.  Increasing anion gap of 14 compared to prior.   UA showed greater than 300 protein, large hemoglobin with negative leukocyte and nitrite. Renal ultrasound shows increased bilateral renal parenchymal echogenicity consistent with chronic medical renal disease.

## 2022-03-23 NOTE — Assessment & Plan Note (Signed)
Check hemoglobin A1c.  Placed on very sensitive sliding scale insulin.

## 2022-03-23 NOTE — Progress Notes (Signed)
Progress Note   Patient: Lori Fleming LGX:[id number] DOB: 1970-12-30 DOA: 03/22/2022     1 DOS: the patient was seen and examined on 03/23/2022   Brief hospital course: 51 y.o. female with medical history significant of hypertension, CVA, type 2 diabetes, hyperlipidemia and recent acute glaucoma of the left eye who presents for abnormal creatinine level.   She was recently seen at [facility] ED on 6/25 for acute glaucoma of the left eye.  She was evaluated by on-call ophthalmology and discharged with prescription for atenolol, brimonidine eyedrops and acetazolamide.   Today she presents to the ER after blood work with PCP shows elevated creatinine level.  She is also felt decreased urine output since yesterday.  Continues to have left-sided headache and eye pain and reports complete vision loss of left eye.  Has been taking multiple ibuprofen a day for the past week.  Has not taken her eyedrops or acetazolamide since yesterday since she was busy.   In the ED, she was afebrile normotensive on room air. CBC showed no leukocytosis, mild anemia with hemoglobin of 11.2 from prior of 9.3.  Platelets of 512.  CMP with sodium of 128, potassium of 3.8, glucose of 193, creatinine of 4.86 from a prior of 1.2.  GFR less than 10.  Increasing anion gap of 14 compared to prior.   UA showed greater than 300 protein, large hemoglobin with negative leukocyte and nitrite. Renal ultrasound shows increased bilateral renal parenchymal echogenicity consistent with chronic medical renal disease.  Assessment and Plan: Acute renal failure (ARF) ([place]) Suspect secondary to recent initiation of acetazolamide for acute glaucoma of the left eye and more frequent NSAID use due to headache and eye pain.  She is also on ACE-I and thiazide for HTN. -Presenting creatinine of 4.86 from prior of 1.2. -Renal ultrasound consistent with chronic medical renal disease. -Calculated FENA of 0.00 -cont IVF hydration  -Repeat bmet in  AM   Hyponatremia Sodium of 128 likely secondary to acetazolamide.   -Now normalized   Recent Acute Glaucoma Continue timolol and brimonidine ophthalmic drops.  Need to hold acetazolamide due to AKI.   Discussed case with Dr. Hollice Espy, who had last evaluated pt during last ED visit. Optho recs to cont to hold acetazolamide given ARF, recommended for pt to f/u with her primary ophthalmologist -Patient reports she did follow up with her ophthalmologist who "drained" eye via needle and had since arranged f/u with Dr. Posey Pronto, another ophthalmologist next week -Currently pt denies light in vision or eye pain   Hyperlipidemia associated with type 2 diabetes mellitus ([name]) Continue statin   Type 2 diabetes with complication (HCC) Check hemoglobin A1c.  Placed on very sensitive sliding scale insulin.   Essential hypertension Continue atenolol but will need to hold losartan-HCTZ due to AKI -repeat bmet in AM       Subjective: Drowsy this AM. Denies eye pain or light in eye  Physical Exam: Vitals:   03/23/22 0800 03/23/22 1000 03/23/22 1042 03/23/22 1619  BP: 131/72 132/70 (!) 165/78 119/67  Pulse: 61 (!) 57 (!) 55 62  Resp: 15 13 16 16   Temp:  98.1 F (36.7 C) 97.7 F (36.5 C) 98.7 F (37.1 C)  TempSrc:   Oral   SpO2: 99% 100% 100% 100%  Weight:   66.9 kg   Height:   5\' 6"  (1.676 m)    General exam: Awake, laying in bed, in nad Respiratory system: Normal respiratory effort, no wheezing Cardiovascular system: regular rate, s1,  s2 Gastrointestinal system: Soft, nondistended, positive BS Central nervous system: CN2-12 grossly intact, strength intact Extremities: Perfused, no clubbing Skin: Normal skin turgor, no notable skin lesions seen Psychiatry: Mood normal // no visual hallucinations   Data Reviewed:  Labs reviewed: K 3.1, Na 135, Cr 4.33  Family Communication: Pt in room, family not at bedside  Disposition: Status is: Inpatient Remains inpatient appropriate because:  Severity of illness  Planned Discharge Destination: Home    Author: Marylu Lund, MD 03/23/2022 5:35 PM  For on call review [email or web address].

## 2022-03-23 NOTE — Assessment & Plan Note (Signed)
Continue statin. 

## 2022-03-23 NOTE — ED Notes (Signed)
Verbal report given to [place] G RN at this time 

## 2022-03-24 DIAGNOSIS — R519 Headache, unspecified: Secondary | ICD-10-CM | POA: Diagnosis not present

## 2022-03-24 DIAGNOSIS — G8929 Other chronic pain: Secondary | ICD-10-CM | POA: Diagnosis not present

## 2022-03-24 DIAGNOSIS — I10 Essential (primary) hypertension: Secondary | ICD-10-CM | POA: Diagnosis not present

## 2022-03-24 LAB — CBC
HCT: 30.5 % — ABNORMAL LOW (ref 36.0–46.0)
Hemoglobin: 10.1 g/dL — ABNORMAL LOW (ref 12.0–15.0)
MCH: 27.4 pg (ref 26.0–34.0)
MCHC: 33.1 g/dL (ref 30.0–36.0)
MCV: 82.7 fL (ref 80.0–100.0)
Platelets: 452 10*3/uL — ABNORMAL HIGH (ref 150–400)
RBC: 3.69 MIL/uL — ABNORMAL LOW (ref 3.87–5.11)
RDW: 13.2 % (ref 11.5–15.5)
WBC: 5.6 10*3/uL (ref 4.0–10.5)
nRBC: 0 % (ref 0.0–0.2)

## 2022-03-24 LAB — COMPREHENSIVE METABOLIC PANEL
ALT: 10 U/L (ref 0–44)
AST: 9 U/L — ABNORMAL LOW (ref 15–41)
Albumin: 3.1 g/dL — ABNORMAL LOW (ref 3.5–5.0)
Alkaline Phosphatase: 41 U/L (ref 38–126)
Anion gap: 10 (ref 5–15)
BUN: 60 mg/dL — ABNORMAL HIGH (ref 6–20)
CO2: 14 mmol/L — ABNORMAL LOW (ref 22–32)
Calcium: 8.7 mg/dL — ABNORMAL LOW (ref 8.9–10.3)
Chloride: 111 mmol/L (ref 98–111)
Creatinine, Ser: 3.37 mg/dL — ABNORMAL HIGH (ref 0.44–1.00)
GFR, Estimated: 16 mL/min — ABNORMAL LOW (ref >60)
Glucose, Bld: 127 mg/dL — ABNORMAL HIGH (ref 70–99)
Potassium: 3.7 mmol/L (ref 3.5–5.1)
Sodium: 135 mmol/L (ref 135–145)
Total Bilirubin: 0.3 mg/dL (ref 0.3–1.2)
Total Protein: 6 g/dL — ABNORMAL LOW (ref 6.5–8.1)

## 2022-03-24 LAB — HEMOGLOBIN A1C
Hgb A1c MFr Bld: 7.7 % — ABNORMAL HIGH (ref 4.8–5.6)
Mean Plasma Glucose: 174 mg/dL

## 2022-03-24 LAB — GLUCOSE, CAPILLARY
Glucose-Capillary: 151 mg/dL — ABNORMAL HIGH (ref 70–99)
Glucose-Capillary: 162 mg/dL — ABNORMAL HIGH (ref 70–99)
Glucose-Capillary: 122 mg/dL — ABNORMAL HIGH (ref 70–99)

## 2022-03-24 MED ORDER — SUMATRIPTAN SUCCINATE 25 MG PO TABS
25.0000 mg | ORAL_TABLET | ORAL | Status: DC | PRN
  Administered 2022-03-24 (×2): 25 mg via ORAL
  Filled 2022-03-24 (×3): qty 1

## 2022-03-24 MED ORDER — HYDRALAZINE HCL 20 MG/ML IJ SOLN
10.0000 mg | INTRAMUSCULAR | Status: DC | PRN
  Administered 2022-03-24: 10 mg via INTRAVENOUS
  Filled 2022-03-24: qty 1

## 2022-03-24 MED ORDER — SODIUM BICARBONATE 650 MG PO TABS
650.0000 mg | ORAL_TABLET | Freq: Two times a day (BID) | ORAL | Status: DC
Start: 2022-03-24 — End: 2022-03-25
  Administered 2022-03-24 – 2022-03-25 (×3): 650 mg via ORAL
  Filled 2022-03-24 (×3): qty 1

## 2022-03-24 NOTE — Progress Notes (Signed)
Progress Note   Patient: Lori Fleming VPX:[id number] DOB: 08-10-1971 DOA: 03/22/2022     2 DOS: the patient was seen and examined on 03/24/2022   Brief hospital course: 51 y.o. female with medical history significant of hypertension, CVA, type 2 diabetes, hyperlipidemia and recent acute glaucoma of the left eye who presents for abnormal creatinine level.   She was recently seen at [facility] ED on 6/25 for acute glaucoma of the left eye.  She was evaluated by on-call ophthalmology and discharged with prescription for atenolol, brimonidine eyedrops and acetazolamide.   Today she presents to the ER after blood work with PCP shows elevated creatinine level.  She is also felt decreased urine output since yesterday.  Continues to have left-sided headache and eye pain and reports complete vision loss of left eye.  Has been taking multiple ibuprofen a day for the past week.  Has not taken her eyedrops or acetazolamide since yesterday since she was busy.   In the ED, she was afebrile normotensive on room air. CBC showed no leukocytosis, mild anemia with hemoglobin of 11.2 from prior of 9.3.  Platelets of 512.  CMP with sodium of 128, potassium of 3.8, glucose of 193, creatinine of 4.86 from a prior of 1.2.  GFR less than 10.  Increasing anion gap of 14 compared to prior.   UA showed greater than 300 protein, large hemoglobin with negative leukocyte and nitrite. Renal ultrasound shows increased bilateral renal parenchymal echogenicity consistent with chronic medical renal disease.  Assessment and Plan: Acute renal failure (ARF) ([place]) Suspect secondary to recent initiation of acetazolamide for acute glaucoma of the left eye and more frequent NSAID use due to headache and eye pain.  She is also on ACE-I and thiazide for HTN. -Presenting creatinine of 4.86 from prior of 1.2. -Renal ultrasound consistent with chronic medical renal disease. -Calculated FENA of 0.00 -Cr is improving, down to 3.37, will  cont IVF -recheck bmet in AM   Hyponatremia Sodium of 128 likely secondary to acetazolamide.   -Now normalized   Recent Acute Glaucoma Continue timolol and brimonidine ophthalmic drops.  Need to hold acetazolamide due to AKI.   Discussed case with Dr. Hollice Espy, who had last evaluated pt during last ED visit. Optho recs to cont to hold acetazolamide given ARF, recommended for pt to f/u with her primary ophthalmologist -Patient reports she did follow up with her ophthalmologist who "drained" eye via needle drainage and had since arranged f/u with Dr. Posey Pronto, another ophthalmologist next week -remains stable at this time   Hyperlipidemia associated with type 2 diabetes mellitus ([place]) Continue statin   Type 2 diabetes with complication ([place]) Check hemoglobin A1c.  Placed on very sensitive sliding scale insulin.   Essential hypertension Continue atenolol but will need to hold losartan-HCTZ due to AKI -bp suboptimally controlled -Will add PRN hydralazine  Headache -reports hx migraines in past -On PRN tylenol -Will give trial of imitrex        Subjective: Drowsy, not very conversant. Does state feeling about the same today  Physical Exam: Vitals:   03/23/22 1619 03/23/22 2121 03/24/22 0539 03/24/22 1019  BP: 119/67 (!) 155/80 (!) 163/78 (!) 182/88  Pulse: 62 (!) 58 (!) 59 65  Resp: 16 18 18 18   Temp: 98.7 F (37.1 C) (!) 97.4 F (36.3 C) 98.3 F (36.8 C) 98.2 F (36.8 C)  TempSrc:    Oral  SpO2: 100% 100% 100% 100%  Weight:      Height:  General exam: Awake, laying in bed, in nad Respiratory system: Normal respiratory effort, no wheezing Cardiovascular system: regular rate, s1, s2 Gastrointestinal system: Soft, nondistended, positive BS Central nervous system: CN2-12 grossly intact, strength intact Extremities: Perfused, no clubbing Skin: Normal skin turgor, no notable skin lesions seen Psychiatry: Mood normal // no visual hallucinations   Data  Reviewed:  Labs reviewed: K 3.7, Na 135, Cr 3.37  Family Communication: Pt in room, family not at bedside  Disposition: Status is: Inpatient Remains inpatient appropriate because: Severity of illness  Planned Discharge Destination: Home    Author: Marylu Lund, MD 03/24/2022 1:25 PM  For on call review [email or web address].

## 2022-03-24 NOTE — Plan of Care (Signed)
  Problem: Education: Goal: Ability to describe self-care measures that may prevent or decrease complications (Diabetes Survival Skills Education) will improve Outcome: Progressing   Problem: Coping: Goal: Ability to adjust to condition or change in health will improve Outcome: Progressing   Problem: Fluid Volume: Goal: Ability to maintain a balanced intake and output will improve Outcome: Progressing   

## 2022-03-25 DIAGNOSIS — N179 Acute kidney failure, unspecified: Secondary | ICD-10-CM | POA: Diagnosis not present

## 2022-03-25 DIAGNOSIS — I10 Essential (primary) hypertension: Secondary | ICD-10-CM | POA: Diagnosis not present

## 2022-03-25 LAB — COMPREHENSIVE METABOLIC PANEL
ALT: 10 U/L (ref 0–44)
AST: 11 U/L — ABNORMAL LOW (ref 15–41)
Albumin: 3 g/dL — ABNORMAL LOW (ref 3.5–5.0)
Alkaline Phosphatase: 46 U/L (ref 38–126)
Anion gap: 11 (ref 5–15)
BUN: 47 mg/dL — ABNORMAL HIGH (ref 6–20)
CO2: 16 mmol/L — ABNORMAL LOW (ref 22–32)
Calcium: 8.8 mg/dL — ABNORMAL LOW (ref 8.9–10.3)
Chloride: 109 mmol/L (ref 98–111)
Creatinine, Ser: 2.79 mg/dL — ABNORMAL HIGH (ref 0.44–1.00)
GFR, Estimated: 20 mL/min — ABNORMAL LOW (ref >60)
Glucose, Bld: 150 mg/dL — ABNORMAL HIGH (ref 70–99)
Potassium: 3.7 mmol/L (ref 3.5–5.1)
Sodium: 136 mmol/L (ref 135–145)
Total Bilirubin: 0.4 mg/dL (ref 0.3–1.2)
Total Protein: 6 g/dL — ABNORMAL LOW (ref 6.5–8.1)

## 2022-03-25 LAB — CBC
HCT: 28.7 % — ABNORMAL LOW (ref 36.0–46.0)
Hemoglobin: 10.2 g/dL — ABNORMAL LOW (ref 12.0–15.0)
MCH: 28.5 pg (ref 26.0–34.0)
MCHC: 35.5 g/dL (ref 30.0–36.0)
MCV: 80.2 fL (ref 80.0–100.0)
Platelets: 445 10*3/uL — ABNORMAL HIGH (ref 150–400)
RBC: 3.58 MIL/uL — ABNORMAL LOW (ref 3.87–5.11)
RDW: 13.2 % (ref 11.5–15.5)
WBC: 4 10*3/uL (ref 4.0–10.5)
nRBC: 0 % (ref 0.0–0.2)

## 2022-03-25 LAB — GLUCOSE, CAPILLARY
Glucose-Capillary: 152 mg/dL — ABNORMAL HIGH (ref 70–99)
Glucose-Capillary: 112 mg/dL — ABNORMAL HIGH (ref 70–99)
Glucose-Capillary: 157 mg/dL — ABNORMAL HIGH (ref 70–99)

## 2022-03-25 MED ORDER — SITAGLIPTIN PHOSPHATE 50 MG PO TABS: 50.0000 mg | ORAL_TABLET | Freq: Every day | ORAL | 0 refills | Status: DC

## 2022-03-25 MED ORDER — SODIUM BICARBONATE 650 MG PO TABS: 650.0000 mg | ORAL_TABLET | Freq: Two times a day (BID) | ORAL | 0 refills | Status: AC

## 2022-03-25 NOTE — Progress Notes (Signed)
Lori Fleming to be discharged Home per MD order. Discussed prescriptions and follow up appointments with the patient. Prescriptions given to patient; medication list explained in detail. Patient verbalized understanding. Work note given to the patient.  Skin clean, dry and intact without evidence of skin break down, no evidence of skin tears noted. IV catheter discontinued intact. Site without signs and symptoms of complications. Dressing and pressure applied. Pt denies pain at the site currently. No complaints noted.  Patient free of lines, drains, and wounds.   An After Visit Summary (AVS) was printed and given to the patient. Patient escorted via wheelchair, and discharged home via private auto.  [name], RN

## 2022-03-25 NOTE — Discharge Summary (Signed)
Physician Discharge Summary   Patient: Lori Fleming MRN: [id number] DOB: [birth year]  Admit date:     03/22/2022  Discharge date: 03/25/22  Discharge Physician: Marylu Lund   PCP: Hoyt Koch, MD   Recommendations at discharge:    Follow up with PCP in 1-2 weeks Follow up with Ophthalmologist as scheduled Recommend repeat bmet in 1 week, focus on renal function  Discharge Diagnoses: Principal Problem:   Acute renal failure (ARF) ([place]) Active Problems:   Essential hypertension   Type 2 diabetes with complication ([place])   Hyperlipidemia associated with type 2 diabetes mellitus ([place])   Glaucoma   Hyponatremia  Resolved Problems:   * No resolved hospital problems. *  Hospital Course: 51 y.o. female with medical history significant of hypertension, CVA, type 2 diabetes, hyperlipidemia and recent acute glaucoma of the left eye who presents for abnormal creatinine level.   She was recently seen at [facility] ED on 6/25 for acute glaucoma of the left eye.  She was evaluated by on-call ophthalmology and discharged with prescription for atenolol, brimonidine eyedrops and acetazolamide.   Today she presents to the ER after blood work with PCP shows elevated creatinine level.  She is also felt decreased urine output since yesterday.  Continues to have left-sided headache and eye pain and reports complete vision loss of left eye.  Has been taking multiple ibuprofen a day for the past week.  Has not taken her eyedrops or acetazolamide since yesterday since she was busy.   In the ED, she was afebrile normotensive on room air. CBC showed no leukocytosis, mild anemia with hemoglobin of 11.2 from prior of 9.3.  Platelets of 512.  CMP with sodium of 128, potassium of 3.8, glucose of 193, creatinine of 4.86 from a prior of 1.2.  GFR less than 10.  Increasing anion gap of 14 compared to prior.   UA showed greater than 300 protein, large hemoglobin with negative leukocyte and  nitrite. Renal ultrasound shows increased bilateral renal parenchymal echogenicity consistent with chronic medical renal disease.  Assessment and Plan: Acute renal failure (ARF) ([place]) Suspect secondary to recent initiation of acetazolamide for acute glaucoma of the left eye and more frequent NSAID use due to headache and eye pain.  She is also on ACE-I and thiazide for HTN. -Presenting creatinine of 4.86 from prior of 1.2. -Renal ultrasound consistent with chronic medical renal disease. -Calculated FENA of 0.00 -Cr is improving, trended down to 2.79 at time of d/c -encourage continued hydration   Hyponatremia Sodium of 128 likely secondary to acetazolamide.   -Now normalized   Recent Acute Glaucoma Continue timolol and brimonidine ophthalmic drops.  Need to hold acetazolamide due to AKI.   Discussed case with Dr. Hollice Espy, who had last evaluated pt during last ED visit. Optho recs to cont to hold acetazolamide given ARF, recommended for pt to f/u with her primary ophthalmologist -Patient reports she did follow up with her ophthalmologist who "drained" eye via needle drainage and had since arranged f/u with Dr. Posey Pronto, another ophthalmologist soon -remained stable   Hyperlipidemia associated with type 2 diabetes mellitus ([place]) Continued statin   Type 2 diabetes with complication ([place]) Hemoglobin A1c of 7.7 Given renal function, advise holding metformin Prescribed januvia on d/c   Essential hypertension Continued atenolol, however held losartan-HCTZ due to AKI   Headache -reports hx migraines in past -On PRN tylenol -Improved with trial of imitrex  -would avoid NSAIDs given renal function  Consultants:  Procedures performed:   Disposition: Home Diet recommendation:  Carb modified diet DISCHARGE MEDICATION: Allergies as of 03/25/2022   No Known Allergies      Medication List     STOP taking these medications    acetaZOLAMIDE ER 500 MG capsule Commonly known  as: DIAMOX   ibuprofen 200 MG tablet Commonly known as: ADVIL   losartan-hydrochlorothiazide 100-25 MG tablet Commonly known as: HYZAAR   metFORMIN 750 MG 24 hr tablet Commonly known as: GLUCOPHAGE-XR       TAKE these medications    aspirin EC 325 MG tablet Take 1 tablet (325 mg total) by mouth daily.   atenolol 25 MG tablet Commonly known as: TENORMIN TAKE 1 TABLET (25 MG TOTAL) BY MOUTH DAILY.   brimonidine 0.2 % ophthalmic solution Commonly known as: ALPHAGAN Place 1 drop into the left eye 3 (three) times daily.   OneTouch Verio test strip Generic drug: glucose blood Use daily to check sugar   rosuvastatin 10 MG tablet Commonly known as: CRESTOR Take 1 tablet (10 mg total) by mouth daily. What changed: when to take this   sitaGLIPtin 50 MG tablet Commonly known as: Januvia Take 1 tablet (50 mg total) by mouth daily.   sodium bicarbonate 650 MG tablet Take 1 tablet (650 mg total) by mouth 2 (two) times daily for 14 days.   timolol 0.5 % ophthalmic solution Commonly known as: TIMOPTIC Place 1 drop into the left eye every 12 (twelve) hours.        Follow-up Information     Hoyt Koch, MD Follow up in 1 week(s).   Specialty: Internal Medicine Why: Hospital follow up Contact information: [place] [address] [phone]         Donato Heinz, MD .   Specialties: Cardiology, Radiology Contact information: [address] [place] [address] [phone]         Follow up with your eye doctor as scheduled Follow up.                 Discharge Exam: Filed Weights   03/22/22 1610 03/23/22 1042  Weight: 66.2 kg 66.9 kg   General exam: Awake, laying in bed, in nad Respiratory system: Normal respiratory effort, no wheezing Cardiovascular system: regular rate, s1, s2 Gastrointestinal system: Soft, nondistended, positive BS Central nervous system: CN2-12 grossly intact, strength  intact Extremities: Perfused, no clubbing Skin: Normal skin turgor, no notable skin lesions seen Psychiatry: Mood normal // no visual hallucinations   Condition at discharge: fair  The results of significant diagnostics from this hospitalization (including imaging, microbiology, ancillary and laboratory) are listed below for reference.   Imaging Studies: US RENAL  Result Date: 03/22/2022 CLINICAL DATA:  Renal failure.  Hematuria. EXAM: RENAL / URINARY TRACT ULTRASOUND COMPLETE COMPARISON:  None Available. FINDINGS: Right Kidney: Renal measurements: 9.4 x 4.0 x 3.9 cm = volume: 78 mL. Moderately increased renal parenchymal echogenicity noted. No mass or hydronephrosis visualized. Left Kidney: Renal measurements: 8.8 x 5.2 x 3.9 cm = volume: 93 mL. Moderately increased renal parenchymal echogenicity noted. No mass or hydronephrosis visualized. Bladder: Appears normal for degree of bladder distention. Other: None. IMPRESSION: Increased bilateral renal parenchymal echogenicity, consistent with chronic medical renal disease. No evidence of hydronephrosis. Electronically Signed   By: Marlaine Hind M.D.   On: 03/22/2022 18:55    Microbiology: Results for orders placed or performed during the hospital encounter of 06/17/21  Resp Panel by RT-PCR (Flu  A&B, Covid) Nasopharyngeal Swab     Status: None   Collection Time: 06/17/21  8:20 PM   Specimen: Nasopharyngeal Swab; Nasopharyngeal(NP) swabs in vial transport medium  Result Value Ref Range Status   SARS Coronavirus 2 by RT PCR NEGATIVE NEGATIVE Final    Comment: (NOTE) SARS-CoV-2 target nucleic acids are NOT DETECTED.  The SARS-CoV-2 RNA is generally detectable in upper respiratory specimens during the acute phase of infection. The lowest concentration of SARS-CoV-2 viral copies this assay can detect is 138 copies/mL. A negative result does not preclude SARS-Cov-2 infection and should not be used as the sole basis for treatment or other patient  management decisions. A negative result may occur with  improper specimen collection/handling, submission of specimen other than nasopharyngeal swab, presence of viral mutation(s) within the areas targeted by this assay, and inadequate number of viral copies(<138 copies/mL). A negative result must be combined with clinical observations, patient history, and epidemiological information. The expected result is Negative.  Fact Sheet for Patients:  EntrepreneurPulse.com.au  Fact Sheet for Healthcare Providers:  IncredibleEmployment.be  This test is no t yet approved or cleared by the [place] FDA and  has been authorized for detection and/or diagnosis of SARS-CoV-2 by FDA under an Emergency Use Authorization (EUA). This EUA will remain  in effect (meaning this test can be used) for the duration of the COVID-19 declaration under Section 564(b)(1) of the Act, 21 U.S.C.section 360bbb-3(b)(1), unless the authorization is terminated  or revoked sooner.       Influenza A by PCR NEGATIVE NEGATIVE Final   Influenza B by PCR NEGATIVE NEGATIVE Final    Comment: (NOTE) The Xpert Xpress SARS-CoV-2/FLU/RSV plus assay is intended as an aid in the diagnosis of influenza from Nasopharyngeal swab specimens and should not be used as a sole basis for treatment. Nasal washings and aspirates are unacceptable for Xpert Xpress SARS-CoV-2/FLU/RSV testing.  Fact Sheet for Patients: EntrepreneurPulse.com.au  Fact Sheet for Healthcare Providers: IncredibleEmployment.be  This test is not yet approved or cleared by the [place] FDA and has been authorized for detection and/or diagnosis of SARS-CoV-2 by FDA under an Emergency Use Authorization (EUA). This EUA will remain in effect (meaning this test can be used) for the duration of the COVID-19 declaration under Section 564(b)(1) of the Act, 21 U.S.C. section 360bbb-3(b)(1),  unless the authorization is terminated or revoked.  Performed at [facility], [address] [place], [place] [address]     Labs: CBC: Recent Labs  Lab 03/22/22 0940 03/22/22 1617 03/23/22 0516 03/24/22 0341 03/25/22 0135  WBC 7.8 7.3 7.0 5.6 4.0  NEUTROABS  --  5.6  --   --   --   HGB 11.5* 11.2* 11.0* 10.1* 10.2*  HCT 35.0* 33.7* 31.3* 30.5* 28.7*  MCV 84.7 83.8 81.7 82.7 80.2  PLT 489.0* 512* 465* 452* 381*   Basic Metabolic Panel: Recent Labs  Lab 03/22/22 0940 03/22/22 1617 03/23/22 0516 03/24/22 0341 03/25/22 0135  NA 128* 128* 135 135 136  K 3.8 3.8 3.1* 3.7 3.7  CL 95* 98 108 111 109  CO2 18* 16* 17* 14* 16*  GLUCOSE 199* 193* 113* 127* 150*  BUN 65* 65* 64* 60* 47*  CREATININE 4.62* 4.86* 4.33* 3.37* 2.79*  CALCIUM 9.6 8.6* 8.5* 8.7* 8.8*   Liver Function Tests: Recent Labs  Lab 03/22/22 0940 03/22/22 1617 03/24/22 0341 03/25/22 0135  AST 12 10* 9* 11*  ALT 13 7 10 10   ALKPHOS 54 49 41 46  BILITOT  0.3 0.5 0.3 0.4  PROT 7.6 6.9 6.0* 6.0*  ALBUMIN 4.2 3.7 3.1* 3.0*   CBG: Recent Labs  Lab 03/24/22 1142 03/24/22 1659 03/25/22 0722 03/25/22 1138 03/25/22 1652  GLUCAP 162* 122* 152* 112* 157*    Discharge time spent: less than 30 minutes.  Signed: Marylu Lund, MD [facility] 03/25/2022

## 2022-03-26 DIAGNOSIS — H31091 Other chorioretinal scars, right eye: Secondary | ICD-10-CM | POA: Diagnosis not present

## 2022-03-26 DIAGNOSIS — H3582 Retinal ischemia: Secondary | ICD-10-CM | POA: Diagnosis not present

## 2022-03-26 DIAGNOSIS — E113591 Type 2 diabetes mellitus with proliferative diabetic retinopathy without macular edema, right eye: Secondary | ICD-10-CM | POA: Diagnosis not present

## 2022-03-26 DIAGNOSIS — H4052X3 Glaucoma secondary to other eye disorders, left eye, severe stage: Secondary | ICD-10-CM | POA: Diagnosis not present

## 2022-04-02 ENCOUNTER — Ambulatory Visit (INDEPENDENT_AMBULATORY_CARE_PROVIDER_SITE_OTHER): Payer: BC Managed Care – PPO | Admitting: Internal Medicine

## 2022-04-02 ENCOUNTER — Encounter: Payer: Self-pay | Admitting: Internal Medicine

## 2022-04-02 ENCOUNTER — Other Ambulatory Visit: Payer: Self-pay | Admitting: Internal Medicine

## 2022-04-02 VITALS — BP 124/78 | HR 69 | Resp 18 | Ht 66.0 in | Wt 143.8 lb

## 2022-04-02 DIAGNOSIS — N179 Acute kidney failure, unspecified: Secondary | ICD-10-CM

## 2022-04-02 DIAGNOSIS — E118 Type 2 diabetes mellitus with unspecified complications: Secondary | ICD-10-CM | POA: Diagnosis not present

## 2022-04-02 DIAGNOSIS — I10 Essential (primary) hypertension: Secondary | ICD-10-CM | POA: Diagnosis not present

## 2022-04-02 DIAGNOSIS — G4452 New daily persistent headache (NDPH): Secondary | ICD-10-CM | POA: Diagnosis not present

## 2022-04-02 LAB — CBC
HCT: 31.4 % — ABNORMAL LOW (ref 36.0–46.0)
Hemoglobin: 10.7 g/dL — ABNORMAL LOW (ref 12.0–15.0)
MCHC: 33.9 g/dL (ref 30.0–36.0)
MCV: 83.3 fl (ref 78.0–100.0)
Platelets: 337 10*3/uL (ref 150.0–400.0)
RBC: 3.77 Mil/uL — ABNORMAL LOW (ref 3.87–5.11)
RDW: 14.6 % (ref 11.5–15.5)
WBC: 7.2 10*3/uL (ref 4.0–10.5)

## 2022-04-02 LAB — COMPREHENSIVE METABOLIC PANEL
ALT: 10 U/L (ref 0–35)
AST: 13 U/L (ref 0–37)
Albumin: 3.8 g/dL (ref 3.5–5.2)
Alkaline Phosphatase: 50 U/L (ref 39–117)
BUN: 31 mg/dL — ABNORMAL HIGH (ref 6–23)
CO2: 18 mEq/L — ABNORMAL LOW (ref 19–32)
Calcium: 9.1 mg/dL (ref 8.4–10.5)
Chloride: 107 mEq/L (ref 96–112)
Creatinine, Ser: 2.04 mg/dL — ABNORMAL HIGH (ref 0.40–1.20)
GFR: 27.91 mL/min — ABNORMAL LOW (ref >60.00)
Glucose, Bld: 150 mg/dL — ABNORMAL HIGH (ref 70–99)
Potassium: 4.2 mEq/L (ref 3.5–5.1)
Sodium: 135 mEq/L (ref 135–145)
Total Bilirubin: 0.3 mg/dL (ref 0.2–1.2)
Total Protein: 6.7 g/dL (ref 6.0–8.3)

## 2022-04-02 MED ORDER — ONDANSETRON HCL 4 MG PO TABS: 4.0000 mg | ORAL_TABLET | Freq: Three times a day (TID) | ORAL | 0 refills | Status: DC | PRN

## 2022-04-02 NOTE — Progress Notes (Signed)
   Subjective:   Patient ID: Lori Fleming, female    DOB: Jan 30, 1971, 51 y.o.   MRN: [id number]  HPI The patient is a 51 YO female coming in for hospital follow up and ongoing care.  PMH, [facility], social history reviewed and updated 03/19/22 to at least 04/02/22  Review of Systems  Constitutional:  Positive for activity change, appetite change and fatigue.  HENT: Negative.    Eyes: Negative.   Respiratory:  Negative for cough, chest tightness and shortness of breath.   Cardiovascular:  Negative for chest pain, palpitations and leg swelling.  Gastrointestinal:  Positive for nausea. Negative for abdominal distention, abdominal pain, constipation, diarrhea and vomiting.  Musculoskeletal: Negative.   Skin: Negative.   Neurological: Negative.   Psychiatric/Behavioral: Negative.      Objective:  Physical Exam Constitutional:      Appearance: She is well-developed. She is ill-appearing.  HENT:     Head: Normocephalic and atraumatic.  Cardiovascular:     Rate and Rhythm: Normal rate and regular rhythm.  Pulmonary:     Effort: Pulmonary effort is normal. No respiratory distress.     Breath sounds: Normal breath sounds. No wheezing or rales.  Abdominal:     General: Bowel sounds are normal. There is no distension.     Palpations: Abdomen is soft.     Tenderness: There is no abdominal tenderness. There is no rebound.  Musculoskeletal:     Cervical back: Normal range of motion.  Skin:    General: Skin is warm and dry.  Neurological:     Mental Status: She is alert and oriented to person, place, and time.     Coordination: Coordination normal.     Vitals:   04/02/22 0813  BP: 124/78  Pulse: 69  Resp: 18  SpO2: 98%  Weight: 143 lb 12.8 oz (65.2 kg)  Height: 5\' 6"  (1.676 m)    Assessment & Plan:

## 2022-04-02 NOTE — Assessment & Plan Note (Signed)
Advised not to take ibuprofen or any NSAIDs. Use only tylenol for headaches due to AKI.

## 2022-04-02 NOTE — Assessment & Plan Note (Signed)
She was changed to [place] 50 mg daily instead of metformin in the hospital. She is not checking sugars lately. Needs repeat HgA1c in 3 months.

## 2022-04-02 NOTE — Assessment & Plan Note (Signed)
She has still been taking ibuprofen for pain and was not aware this could impact her kidneys. Advised to avoid ibuprofen, aleve, motrin, naproxen etc. Use only tylenol for headaches. Checking CMP and CBC today. She is not eating well but is hydrating and making urine normally. Adjust as needed but will likely need continued follow up for this over the next weeks to months.

## 2022-04-02 NOTE — Assessment & Plan Note (Signed)
BP at goal on atenolol 25 mg daily. Checking CMP as she has recent AKI which was not resolved on discharge.

## 2022-04-02 NOTE — Patient Instructions (Addendum)
We will check the kidney numbers today.  We have sent in a nausea medicine to take.  Do not take ibuprofen or aleve or motrin or naproxen for pain as these can hurt the kidneys.  Take tylenol for pain if needed.

## 2022-04-11 DIAGNOSIS — E113591 Type 2 diabetes mellitus with proliferative diabetic retinopathy without macular edema, right eye: Secondary | ICD-10-CM | POA: Diagnosis not present

## 2022-04-13 ENCOUNTER — Telehealth: Payer: Self-pay

## 2022-04-13 NOTE — Telephone Encounter (Signed)
Transition Care Management Unsuccessful Follow-up Telephone Call  Date of discharge and from where:  03/25/2022  [facility]  Attempts:  1st Attempt  Reason for unsuccessful TCM follow-up call:  No answer/busy Tomasa Rand, RN, BSN, CEN [place] Coordinator [phone]

## 2022-04-13 NOTE — Telephone Encounter (Signed)
  Transition Care Management Unsuccessful Follow-up Telephone Call  Date of discharge and from where:  Lori Fleming 03/22/22-03/25/22  Attempts:  2nd Attempt  Reason for unsuccessful TCM follow-up call:  No answer/busy

## 2022-04-16 ENCOUNTER — Other Ambulatory Visit: Payer: Self-pay | Admitting: *Deleted

## 2022-04-16 NOTE — Patient Outreach (Signed)
  Care Coordination TOC Note Transition Care Management Unsuccessful Follow-up Telephone Call  Date of discharge and from where:  03/25/22 [facility]  Attempts:  3rd Attempt  Reason for unsuccessful TCM follow-up call:  Left voice message.  Emelia Loron RN, BSN [place] [phone] [email or web address]

## 2022-04-19 ENCOUNTER — Ambulatory Visit: Payer: BC Managed Care – PPO | Admitting: Internal Medicine

## 2022-04-21 ENCOUNTER — Other Ambulatory Visit: Payer: Self-pay | Admitting: Internal Medicine

## 2022-04-25 ENCOUNTER — Ambulatory Visit (INDEPENDENT_AMBULATORY_CARE_PROVIDER_SITE_OTHER): Payer: BC Managed Care – PPO | Admitting: Internal Medicine

## 2022-04-25 ENCOUNTER — Encounter: Payer: Self-pay | Admitting: Internal Medicine

## 2022-04-25 DIAGNOSIS — I10 Essential (primary) hypertension: Secondary | ICD-10-CM

## 2022-04-25 DIAGNOSIS — N179 Acute kidney failure, unspecified: Secondary | ICD-10-CM

## 2022-04-25 LAB — COMPREHENSIVE METABOLIC PANEL
ALT: 7 U/L (ref 0–35)
AST: 10 U/L (ref 0–37)
Albumin: 3.7 g/dL (ref 3.5–5.2)
Alkaline Phosphatase: 49 U/L (ref 39–117)
BUN: 12 mg/dL (ref 6–23)
CO2: 24 mEq/L (ref 19–32)
Calcium: 8.9 mg/dL (ref 8.4–10.5)
Chloride: 106 mEq/L (ref 96–112)
Creatinine, Ser: 1.79 mg/dL — ABNORMAL HIGH (ref 0.40–1.20)
GFR: 32.64 mL/min — ABNORMAL LOW (ref >60.00)
Glucose, Bld: 115 mg/dL — ABNORMAL HIGH (ref 70–99)
Potassium: 4.1 mEq/L (ref 3.5–5.1)
Sodium: 137 mEq/L (ref 135–145)
Total Bilirubin: 0.3 mg/dL (ref 0.2–1.2)
Total Protein: 6.8 g/dL (ref 6.0–8.3)

## 2022-04-25 NOTE — Assessment & Plan Note (Signed)
BP at goal on atenolol 25 mg daily and checking CMP due to AKI which is gradually improving.

## 2022-04-25 NOTE — Patient Instructions (Addendum)
We will recheck the labs today.  Make sure to still avoid ibuprofen, aleve, motrin, naproxen, goody powder, bc powder because they can hurt the kidney function.

## 2022-04-25 NOTE — Assessment & Plan Note (Signed)
Checking CMP today. Previously was still improving and may still improve over the next 3-6 months to either her baseline or new baseline. Encouraged to avoid NSAIDs lifelong.

## 2022-04-25 NOTE — Progress Notes (Signed)
   Subjective:   Patient ID: Lori Fleming, female    DOB: August 31, 1971, 51 y.o.   MRN: [id number]  HPI The patient is a 51 YO female coming in for follow up and wants to be cleared to return to work next week. Energy is returned and headaches minimal, nausea gone. Vision still gone left eye right eye normal seeing eye specialist regularly and seeing them in next week or so.  Review of Systems  Constitutional: Negative.   HENT: Negative.    Eyes:  Positive for visual disturbance.  Respiratory:  Negative for cough, chest tightness and shortness of breath.   Cardiovascular:  Negative for chest pain, palpitations and leg swelling.  Gastrointestinal:  Negative for abdominal distention, abdominal pain, constipation, diarrhea, nausea and vomiting.  Musculoskeletal: Negative.   Skin: Negative.   Neurological: Negative.   Psychiatric/Behavioral: Negative.      Objective:  Physical Exam Constitutional:      Appearance: She is well-developed.  HENT:     Head: Normocephalic and atraumatic.  Cardiovascular:     Rate and Rhythm: Normal rate and regular rhythm.  Pulmonary:     Effort: Pulmonary effort is normal. No respiratory distress.     Breath sounds: Normal breath sounds. No wheezing or rales.  Abdominal:     General: Bowel sounds are normal. There is no distension.     Palpations: Abdomen is soft.     Tenderness: There is no abdominal tenderness. There is no rebound.  Musculoskeletal:     Cervical back: Normal range of motion.  Skin:    General: Skin is warm and dry.  Neurological:     Mental Status: She is alert and oriented to person, place, and time.     Coordination: Coordination normal.     Vitals:   04/25/22 0803  BP: 130/86  Pulse: 74  Resp: 18  SpO2: 99%  Weight: 147 lb 9.6 oz (67 kg)  Height: 5\' 6"  (1.676 m)    Assessment & Plan:

## 2022-04-26 DIAGNOSIS — E113511 Type 2 diabetes mellitus with proliferative diabetic retinopathy with macular edema, right eye: Secondary | ICD-10-CM | POA: Diagnosis not present

## 2022-05-07 ENCOUNTER — Telehealth: Payer: Self-pay | Admitting: Internal Medicine

## 2022-05-07 NOTE — Telephone Encounter (Signed)
Patient needs her rx for [place] refilled - this was origionaly filled by [facility] -   Patient would like this sent to [facility] in [place], [place]

## 2022-05-08 MED ORDER — SITAGLIPTIN PHOSPHATE 50 MG PO TABS: 50.0000 mg | ORAL_TABLET | Freq: Every day | ORAL | 1 refills | Status: DC

## 2022-05-11 ENCOUNTER — Observation Stay (HOSPITAL_COMMUNITY)
Admission: EM | Admit: 2022-05-11 | Discharge: 2022-05-13 | Disposition: A | Discharge status: Home / Self Care | Payer: BC Managed Care – PPO | Attending: Internal Medicine | Admitting: Internal Medicine

## 2022-05-11 ENCOUNTER — Emergency Department (HOSPITAL_COMMUNITY): Payer: BC Managed Care – PPO

## 2022-05-11 ENCOUNTER — Other Ambulatory Visit: Payer: Self-pay

## 2022-05-11 ENCOUNTER — Encounter (HOSPITAL_COMMUNITY): Payer: Self-pay | Admitting: Emergency Medicine

## 2022-05-11 DIAGNOSIS — E1122 Type 2 diabetes mellitus with diabetic chronic kidney disease: Secondary | ICD-10-CM | POA: Diagnosis not present

## 2022-05-11 DIAGNOSIS — I160 Hypertensive urgency: Secondary | ICD-10-CM | POA: Diagnosis not present

## 2022-05-11 DIAGNOSIS — I129 Hypertensive chronic kidney disease with stage 1 through stage 4 chronic kidney disease, or unspecified chronic kidney disease: Secondary | ICD-10-CM | POA: Insufficient documentation

## 2022-05-11 DIAGNOSIS — E118 Type 2 diabetes mellitus with unspecified complications: Secondary | ICD-10-CM | POA: Diagnosis present

## 2022-05-11 DIAGNOSIS — H547 Unspecified visual loss: Secondary | ICD-10-CM | POA: Diagnosis not present

## 2022-05-11 DIAGNOSIS — D631 Anemia in chronic kidney disease: Secondary | ICD-10-CM | POA: Insufficient documentation

## 2022-05-11 DIAGNOSIS — Z8673 Personal history of transient ischemic attack (TIA), and cerebral infarction without residual deficits: Secondary | ICD-10-CM | POA: Insufficient documentation

## 2022-05-11 DIAGNOSIS — E1165 Type 2 diabetes mellitus with hyperglycemia: Secondary | ICD-10-CM | POA: Diagnosis not present

## 2022-05-11 DIAGNOSIS — N179 Acute kidney failure, unspecified: Secondary | ICD-10-CM | POA: Insufficient documentation

## 2022-05-11 DIAGNOSIS — Z79899 Other long term (current) drug therapy: Secondary | ICD-10-CM | POA: Insufficient documentation

## 2022-05-11 DIAGNOSIS — Z7984 Long term (current) use of oral hypoglycemic drugs: Secondary | ICD-10-CM | POA: Insufficient documentation

## 2022-05-11 DIAGNOSIS — H5712 Ocular pain, left eye: Secondary | ICD-10-CM | POA: Diagnosis not present

## 2022-05-11 DIAGNOSIS — R519 Headache, unspecified: Secondary | ICD-10-CM | POA: Diagnosis not present

## 2022-05-11 DIAGNOSIS — N1832 Chronic kidney disease, stage 3b: Secondary | ICD-10-CM | POA: Insufficient documentation

## 2022-05-11 DIAGNOSIS — D75839 Thrombocytosis, unspecified: Secondary | ICD-10-CM | POA: Diagnosis present

## 2022-05-11 DIAGNOSIS — D638 Anemia in other chronic diseases classified elsewhere: Secondary | ICD-10-CM | POA: Diagnosis present

## 2022-05-11 DIAGNOSIS — J189 Pneumonia, unspecified organism: Secondary | ICD-10-CM | POA: Diagnosis not present

## 2022-05-11 DIAGNOSIS — Z7982 Long term (current) use of aspirin: Secondary | ICD-10-CM | POA: Insufficient documentation

## 2022-05-11 DIAGNOSIS — D649 Anemia, unspecified: Secondary | ICD-10-CM | POA: Diagnosis present

## 2022-05-11 DIAGNOSIS — H409 Unspecified glaucoma: Secondary | ICD-10-CM | POA: Diagnosis present

## 2022-05-11 LAB — BASIC METABOLIC PANEL
Anion gap: 9 (ref 5–15)
BUN: 20 mg/dL (ref 6–20)
CO2: 23 mmol/L (ref 22–32)
Calcium: 9.5 mg/dL (ref 8.9–10.3)
Chloride: 105 mmol/L (ref 98–111)
Creatinine, Ser: 2.22 mg/dL — ABNORMAL HIGH (ref 0.44–1.00)
GFR, Estimated: 26 mL/min — ABNORMAL LOW (ref >60)
Glucose, Bld: 201 mg/dL — ABNORMAL HIGH (ref 70–99)
Potassium: 4.7 mmol/L (ref 3.5–5.1)
Sodium: 137 mmol/L (ref 135–145)

## 2022-05-11 LAB — URINALYSIS, MICROSCOPIC (REFLEX)

## 2022-05-11 LAB — CBC WITH DIFFERENTIAL/PLATELET
Abs Immature Granulocytes: 0.02 10*3/uL (ref 0.00–0.07)
Basophils Absolute: 0.1 10*3/uL (ref 0.0–0.1)
Basophils Relative: 1 %
Eosinophils Absolute: 0 10*3/uL (ref 0.0–0.5)
Eosinophils Relative: 0 %
HCT: 32.9 % — ABNORMAL LOW (ref 36.0–46.0)
Hemoglobin: 10.6 g/dL — ABNORMAL LOW (ref 12.0–15.0)
Immature Granulocytes: 0 %
Lymphocytes Relative: 16 %
Lymphs Abs: 1.4 10*3/uL (ref 0.7–4.0)
MCH: 27.4 pg (ref 26.0–34.0)
MCHC: 32.2 g/dL (ref 30.0–36.0)
MCV: 85 fL (ref 80.0–100.0)
Monocytes Absolute: 0.5 10*3/uL (ref 0.1–1.0)
Monocytes Relative: 6 %
Neutro Abs: 6.5 10*3/uL (ref 1.7–7.7)
Neutrophils Relative %: 77 %
Platelets: 483 10*3/uL — ABNORMAL HIGH (ref 150–400)
RBC: 3.87 MIL/uL (ref 3.87–5.11)
RDW: 14.1 % (ref 11.5–15.5)
WBC: 8.5 10*3/uL (ref 4.0–10.5)
nRBC: 0 % (ref 0.0–0.2)

## 2022-05-11 LAB — URINALYSIS, ROUTINE W REFLEX MICROSCOPIC
Glucose, UA: NEGATIVE mg/dL
Ketones, ur: NEGATIVE mg/dL
Leukocytes,Ua: NEGATIVE
Nitrite: NEGATIVE
Protein, ur: >300 mg/dL — AB
Specific Gravity, Urine: >1.03 — ABNORMAL HIGH (ref 1.005–1.030)
pH: 5.5 (ref 5.0–8.0)

## 2022-05-11 MED ORDER — OXYCODONE HCL 5 MG PO TABS
5.0000 mg | ORAL_TABLET | Freq: Once | ORAL | Status: AC
  Administered 2022-05-12: 5 mg via ORAL
  Filled 2022-05-11: qty 1

## 2022-05-11 NOTE — ED Provider Triage Note (Signed)
Emergency Medicine Provider Triage Evaluation Note  Shamaine Mulkern , a 51 y.o. female  was evaluated in triage.  Pt complains of not feeling well over the past couple days primarily secondary to headache, left eye pain, nausea, and postnasal drainage.  She does have history of CVA.  She denies any visual change, new balance issues, unilateral weakness, chest pain, shortness of breath, facial droop, speech difficulty.  Review of Systems  Positive: As above Negative: As above  Physical Exam  BP (!) 150/95 (BP Location: Right Arm)   Pulse 82   Temp 98.4 F (36.9 C) (Oral)   Resp 18   SpO2 100%  Gen:   Awake, no distress   Resp:  Normal effort  MSK:   Moves extremities without difficulty  Other:  Cranial nerves III through XII intact.  Without facial droop.  Without pronator drift.  Full range of motion of bilateral upper and lower extremities with 5/5 strength in extensor and flexor muscle groups.  Medical Decision Making  Medically screening exam initiated at 8:08 PM.  Appropriate orders placed.  Lexiana Spindel was informed that the remainder of the evaluation will be completed by another provider, this initial triage assessment does not replace that evaluation, and the importance of remaining in the ED until their evaluation is complete.     Evlyn Courier, PA-C 05/11/22 2010

## 2022-05-11 NOTE — ED Notes (Signed)
PT not in lobby when called for CT

## 2022-05-11 NOTE — ED Triage Notes (Addendum)
Patient with left eye pain since yesterday.  States she took a tylenol which helped with her pain.  She is having some clear drainage from the left eye.  Patient has had a TIA which left her with right side deficits and speech difficulties.  Today she has left facial droop and mouth droop.  Patient has equal hand and feet push and pulls. Patient states she has nausea.  New balance issues.  All symptoms started two days ago.

## 2022-05-12 DIAGNOSIS — D75839 Thrombocytosis, unspecified: Secondary | ICD-10-CM

## 2022-05-12 DIAGNOSIS — E118 Type 2 diabetes mellitus with unspecified complications: Secondary | ICD-10-CM

## 2022-05-12 DIAGNOSIS — R519 Headache, unspecified: Secondary | ICD-10-CM

## 2022-05-12 DIAGNOSIS — H402220 Chronic angle-closure glaucoma, left eye, stage unspecified: Secondary | ICD-10-CM

## 2022-05-12 DIAGNOSIS — N179 Acute kidney failure, unspecified: Secondary | ICD-10-CM

## 2022-05-12 DIAGNOSIS — I160 Hypertensive urgency: Secondary | ICD-10-CM | POA: Diagnosis not present

## 2022-05-12 DIAGNOSIS — H547 Unspecified visual loss: Secondary | ICD-10-CM | POA: Diagnosis not present

## 2022-05-12 DIAGNOSIS — D638 Anemia in other chronic diseases classified elsewhere: Secondary | ICD-10-CM

## 2022-05-12 DIAGNOSIS — N189 Chronic kidney disease, unspecified: Secondary | ICD-10-CM

## 2022-05-12 DIAGNOSIS — D649 Anemia, unspecified: Secondary | ICD-10-CM

## 2022-05-12 LAB — CBG MONITORING, ED
Glucose-Capillary: 218 mg/dL — ABNORMAL HIGH (ref 70–99)
Glucose-Capillary: 207 mg/dL — ABNORMAL HIGH (ref 70–99)
Glucose-Capillary: 181 mg/dL — ABNORMAL HIGH (ref 70–99)
Glucose-Capillary: 154 mg/dL — ABNORMAL HIGH (ref 70–99)

## 2022-05-12 MED ORDER — DORZOLAMIDE HCL-TIMOLOL MAL 2-0.5 % OP SOLN
1.0000 [drp] | Freq: Two times a day (BID) | OPHTHALMIC | Status: DC
  Administered 2022-05-12 – 2022-05-13 (×2): 1 [drp] via OPHTHALMIC
  Filled 2022-05-12: qty 10

## 2022-05-12 MED ORDER — TIMOLOL MALEATE 0.5 % OP SOLN
1.0000 [drp] | Freq: Two times a day (BID) | OPHTHALMIC | Status: DC
Start: 2022-05-12 — End: 2022-05-12

## 2022-05-12 MED ORDER — ROSUVASTATIN CALCIUM 5 MG PO TABS
10.0000 mg | ORAL_TABLET | Freq: Every evening | ORAL | Status: DC
  Administered 2022-05-12: 10 mg via ORAL
  Filled 2022-05-12: qty 2

## 2022-05-12 MED ORDER — INSULIN ASPART 100 UNIT/ML IJ SOLN: 0.0000 [IU] | Freq: Every day | INTRAMUSCULAR | Status: DC

## 2022-05-12 MED ORDER — INSULIN ASPART 100 UNIT/ML IJ SOLN
0.0000 [IU] | Freq: Three times a day (TID) | INTRAMUSCULAR | Status: DC
  Administered 2022-05-12: 3 [IU] via SUBCUTANEOUS
  Administered 2022-05-12: 2 [IU] via SUBCUTANEOUS
  Administered 2022-05-12: 3 [IU] via SUBCUTANEOUS
  Administered 2022-05-13: 2 [IU] via SUBCUTANEOUS

## 2022-05-12 MED ORDER — ACETAMINOPHEN 325 MG PO TABS
650.0000 mg | ORAL_TABLET | Freq: Four times a day (QID) | ORAL | Status: DC | PRN
  Administered 2022-05-12: 650 mg via ORAL
  Filled 2022-05-12: qty 2

## 2022-05-12 MED ORDER — HYDRALAZINE HCL 20 MG/ML IJ SOLN
10.0000 mg | INTRAMUSCULAR | Status: DC | PRN
  Administered 2022-05-12: 10 mg via INTRAVENOUS
  Filled 2022-05-12: qty 1

## 2022-05-12 MED ORDER — ENOXAPARIN SODIUM 30 MG/0.3ML IJ SOSY
30.0000 mg | PREFILLED_SYRINGE | INTRAMUSCULAR | Status: DC
  Administered 2022-05-12 – 2022-05-13 (×2): 30 mg via SUBCUTANEOUS
  Filled 2022-05-12 (×2): qty 0.3

## 2022-05-12 MED ORDER — ONDANSETRON HCL 4 MG PO TABS: 4.0000 mg | ORAL_TABLET | Freq: Four times a day (QID) | ORAL | Status: DC | PRN

## 2022-05-12 MED ORDER — ASPIRIN 81 MG PO TBEC
81.0000 mg | DELAYED_RELEASE_TABLET | Freq: Every day | ORAL | Status: DC
  Administered 2022-05-12 – 2022-05-13 (×2): 81 mg via ORAL
  Filled 2022-05-12 (×2): qty 1

## 2022-05-12 MED ORDER — LABETALOL HCL 5 MG/ML IV SOLN
20.0000 mg | Freq: Once | INTRAVENOUS | Status: AC
Start: 2022-05-12 — End: 2022-05-12
  Administered 2022-05-12: 20 mg via INTRAVENOUS
  Filled 2022-05-12: qty 4

## 2022-05-12 MED ORDER — HYDRALAZINE HCL 25 MG PO TABS
25.0000 mg | ORAL_TABLET | Freq: Once | ORAL | Status: AC
  Administered 2022-05-12: 25 mg via ORAL
  Filled 2022-05-12: qty 1

## 2022-05-12 MED ORDER — ONDANSETRON HCL 4 MG/2ML IJ SOLN: 4.0000 mg | Freq: Four times a day (QID) | INTRAMUSCULAR | Status: DC | PRN

## 2022-05-12 MED ORDER — ALBUTEROL SULFATE (2.5 MG/3ML) 0.083% IN NEBU: 2.5000 mg | INHALATION_SOLUTION | Freq: Four times a day (QID) | RESPIRATORY_TRACT | Status: DC | PRN

## 2022-05-12 MED ORDER — ATENOLOL 25 MG PO TABS
25.0000 mg | ORAL_TABLET | Freq: Every day | ORAL | Status: DC
  Administered 2022-05-12 – 2022-05-13 (×2): 25 mg via ORAL
  Filled 2022-05-12 (×2): qty 1

## 2022-05-12 MED ORDER — SODIUM CHLORIDE 0.9% FLUSH
3.0000 mL | Freq: Two times a day (BID) | INTRAVENOUS | Status: DC
  Administered 2022-05-12 – 2022-05-13 (×3): 3 mL via INTRAVENOUS

## 2022-05-12 MED ORDER — OXYCODONE-ACETAMINOPHEN 5-325 MG PO TABS
1.0000 | ORAL_TABLET | Freq: Four times a day (QID) | ORAL | Status: DC | PRN
  Administered 2022-05-12 – 2022-05-13 (×3): 1 via ORAL
  Filled 2022-05-12 (×3): qty 1

## 2022-05-12 MED ORDER — HYDRALAZINE HCL 20 MG/ML IJ SOLN: 10.0000 mg | INTRAMUSCULAR | Status: DC | PRN

## 2022-05-12 MED ORDER — ACETAMINOPHEN 650 MG RE SUPP: 650.0000 mg | Freq: Four times a day (QID) | RECTAL | Status: DC | PRN

## 2022-05-12 MED ORDER — BRIMONIDINE TARTRATE 0.2 % OP SOLN
1.0000 [drp] | Freq: Three times a day (TID) | OPHTHALMIC | Status: DC
  Administered 2022-05-12 – 2022-05-13 (×3): 1 [drp] via OPHTHALMIC
  Filled 2022-05-12: qty 5

## 2022-05-12 MED ORDER — HYDRALAZINE HCL 25 MG PO TABS
25.0000 mg | ORAL_TABLET | Freq: Three times a day (TID) | ORAL | Status: DC
  Administered 2022-05-12 – 2022-05-13 (×3): 25 mg via ORAL
  Filled 2022-05-12 (×3): qty 1

## 2022-05-12 NOTE — ED Notes (Signed)
Dr Bero at bedside.  

## 2022-05-12 NOTE — ED Notes (Signed)
Received verbal report from [place] at this time

## 2022-05-12 NOTE — ED Notes (Signed)
Patient ambulated to the bathroom independently. Patient is now back in bed.

## 2022-05-12 NOTE — H&P (Signed)
History and Physical    Patient: Lori Fleming:[id number] DOB: April 12, 1971 DOA: 05/11/2022 DOS: the patient was seen and examined on 05/12/2022 PCP: Hoyt Koch, MD  Patient coming from: Home  Chief Complaint:  Chief Complaint  Patient presents with   Eye Pain   Cerebrovascular Accident   HPI: Lori Fleming is a 51 y.o. female with medical history significant of  hypertension, CVA, diabetes mellitus type 2, prior history of retinal detachment, hyperlipidemia, CKD stage IIIb, and glaucoma of left eye who presents with complaints of left eye pain and headache over the last 2 days she reports having an aching pain on the left side of her head and behind the left eye.  Her eye has been tearing a lot and was noted to be red and irritated by her husband.  She reports that she only sees black out of the left eye since initially diagnosed on June 25 and was started on acetazolamide in addition to timolol and brimonidine drops.  She was seen by her ophthalmologist at [facility] opthalmology, and had been sent to [facility] where she received some injection in her eye by Dr. Posey Pronto, but reports being told there was nothing else that they could do.  Last hospitalized 6/29 -7/2 due to acute renal failure thought to be secondary to acetazolamide which had been recently started due to glaucoma of the left eye, reports of increased use of NSAIDs due to headache/left eye pain, ACE inhibitor, and thiazide diuretic for blood pressure.  These medications were stopped.  At discharge creatinine was down to 2.79 and on recent check on primary care provider on 8/2 was 1.79.  Patient notes that her blood pressures have been elevated into the 200s at times at home and normal other times.  She has been following up with her primary care provider, but no additional blood pressure medications were started as her blood pressures appear to be normal while in the office.  She is unsure if it is related to her  usually just taking her blood pressure medications.  Associated symptoms include nausea.  Denied having any recent fever, shortness of breath, chest pain, vomiting, focal weakness, dysuria, or abdominal pain.  On admission into the emergency department patient was noted to have temperature of 99.1 F with blood pressures elevated up to 255/113, and all other vital signs maintained.  Labs significant for BUN 20, creatinine 2.22, and glucose 201.  Chest x-ray showed no acute abnormality.  CT scan of the head did not note any acute abnormality.  MRI of the brain and orbits was obtained which revealed no acute abnormality.  Patient has been given labetalol 20 mg IV, hydralazine 25 mg p.o., and oxycodone 5 mg p.o.  Review of Systems: As mentioned in the history of present illness. All other systems reviewed and are negative. Past Medical History:  Diagnosis Date   Diabetes mellitus without complication ([place])    Hypertension    Stroke [facility])    Past Surgical History:  Procedure Laterality Date   TEE WITHOUT CARDIOVERSION N/A 07/10/2021   Procedure: TRANSESOPHAGEAL ECHOCARDIOGRAM (TEE);  Surgeon: Geralynn Rile, MD;  Location: [facility];  Service: Cardiovascular;  Laterality: N/A;   Social History:  reports that she has never smoked. She has never used smokeless tobacco. She reports that she does not drink alcohol and does not use drugs.  No Known Allergies  Family History  Problem Relation Age of Onset   Hypertension Father    Diabetes Father  Hypertension Sister    Mental illness Sister    Mental illness Brother    Alcohol abuse Paternal Grandfather    Diabetes Maternal Aunt     Prior to Admission medications   Medication Sig Start Date End Date Taking? Authorizing Provider  aspirin EC 81 MG tablet Take 81 mg by mouth daily. Swallow whole.    [provider]  atenolol (TENORMIN) 25 MG tablet TAKE 1 TABLET (25 MG TOTAL) BY MOUTH DAILY. 12/26/21   Hoyt Koch, MD   brimonidine (ALPHAGAN) 0.2 % ophthalmic solution Place 1 drop into the left eye 3 (three) times daily. 3/53/61   Delora Fuel, MD  glucose blood [facility] VERIO) test strip Use daily to check sugar 06/29/21   Hoyt Koch, MD  ondansetron (ZOFRAN) 4 MG tablet Take 1 tablet (4 mg total) by mouth every 8 (eight) hours as needed for nausea or vomiting. 04/02/22   Hoyt Koch, MD  rosuvastatin (CRESTOR) 10 MG tablet Take 1 tablet (10 mg total) by mouth daily. Patient taking differently: Take 10 mg by mouth every evening. 07/05/21 03/22/22  Donato Heinz, MD  sitaGLIPtin (JANUVIA) 50 MG tablet Take 1 tablet (50 mg total) by mouth daily. 05/08/22   Hoyt Koch, MD  timolol (TIMOPTIC) 0.5 % ophthalmic solution Place 1 drop into the left eye every 12 (twelve) hours. 4/43/15   Delora Fuel, MD    Physical Exam: Vitals:   05/12/22 0545 05/12/22 0600 05/12/22 0615 05/12/22 0700  BP: (!) 198/94  (!) 198/95 (!) 184/123  Pulse: 86 92 84 84  Resp: 19 16 16 11   Temp:  99.1 F (37.3 C)    TempSrc:  Oral    SpO2: 98% 98% 98% 99%   Exam  Constitutional: Middle-age female who appears to be in some discomfort Eyes: conjunctival injection of the left eye with tearing ENMT: Mucous membranes are moist.    Neck: normal, supple, no masses, no thyromegaly Respiratory: clear to auscultation bilaterally, no wheezing, no crackles. Normal respiratory effort. No accessory muscle use.  Cardiovascular: Regular rate and rhythm, no murmurs / rubs / gallops. No extremity edema. 2+ pedal pulses. No carotid bruits.  Abdomen: no tenderness, no masses palpated. No hepatosplenomegaly. Bowel sounds positive.  Musculoskeletal: no clubbing / cyanosis. No joint deformity upper and lower extremities. Good ROM, no contractures. Normal muscle tone.  Skin: no rashes, lesions, ulcers. No induration Neurologic: CN 2-12 grossly intact. Sensation intact, DTR normal. Strength 5/5 in all 4.   Psychiatric: Normal judgment and insight. Alert and oriented x 3. Normal mood.   Data Reviewed:  Reviewed labs, imaging, and pertinent records as noted above  Assessment and Plan: Hypertensive urgency/emergency Patient presented with complaints of headache and left eye pain.  Blood pressures noted to be elevated up to 255/113.  Home blood pressure regimen included atenolol 25 mg daily.  Question if patient's acute kidney injury is secondary to uncontrolled hypertension which would signify endorgan damage.  Patient has been given 25 mg of hydralazine p.o. and labetalol 20 mg IV. -Admit to a telemetry bed -Continue atenolol 25 mg daily -Start hydralazine 25 mg p.o. 3 times daily   -Goal to drop blood pressure placed 20-25% in the first 24 hours. -Hydralazine IV as needed for elevated blood pressures greater than 200 -Adjust regimen as needed  Glaucoma of left eye Acute on chronic.  Patient has known glaucoma of the left eye since June.  MRI of the brain and orbits did not note  any acute abnormality.  Discussed case with Dr. Delight Hoh on-call for ophthalmology who specializes in glaucoma.  After reviewing patient's case he noted that this is more palliative in nature due to the lack of light perception, and that her eye pressure may be continuing to go up causing her symptoms.  Recommended changing timolol drops to dorzolamide-timolol in attempt to lower patient's eye pressure.  He also recommended considering methazolamide 25 mg twice daily to be increased up to 50 mg twice daily, but recommended checking with pharmacy given patient's kidney function.  -Oxycodone as needed for pain -Continue brimonidine drops as prescribed -Discontinued timolol and started dorzolamide-timolol 1 drop twice daily as per recommendations  -Did not start methazolamide due to sharing similar reaction to acetazolamide and patient having acute kidney injury -Appreciate Dr. Tobe Sos over the phone consultative  services.  He reports that he can also possible offer palliative laser treatment, but patient is currently under the care of [facility] ophthalmology.  So she can either follow-up with them or she can be set up to be seen in his clinic depending on her preference.  He is available if needed for any additional questions.  Headache Acute.  Patient presented with complaints of headache.  Suspect patient's blood pressure and/or eye issues.  -Control blood pressures as noted above  Acute kidney injury superimposed on chronic kidney disease stage IIIb Creatinine noted to be elevated up to 2.22 with BUN 20.  Baseline creatinine previously had been 1.79 on 8/2.  This is greater than a 0.3 increased from baseline to suggest acute kidney injury. -Check urine creatinine and urine sodium -Recheck creatinine in a.m.  Anemia of chronic disease Hemoglobin 10.6 g/dL with normal MCV and MCH which appears near patient's baseline. -Continue to monitor  Thrombocytosis Acute.  Platelet count 483.  Suspect reactive to above. -Recheck CBC tomorrow morning  Uncontrolled diabetes mellitus type 2 with hyperglycemia On admission glucose 201.  Last Hemoglobin A1c 7.7.  Home medication regimen includes Januvia 50 mg daily. Patient with prior history of retinal detachment in both eyes that had been repaired thought likely related to her uncontrolled diabetes. -Hypoglycemic protocols -Hold Januvia -CBGs before every meal with sensitive SSI -Adjust insulin regimen as needed -Diabetic education  DVT prophylaxis: Lovenox Advance Care Planning:   Code Status: Full Code  Consults: None  Family Communication: None requested at this time  Severity of Illness: The appropriate patient status for this patient is OBSERVATION. Observation status is judged to be reasonable and necessary in order to provide the required intensity of service to ensure the patient's safety. The patient's presenting symptoms, physical exam  findings, and initial radiographic and laboratory data in the context of their medical condition is felt to place them at decreased risk for further clinical deterioration. Furthermore, it is anticipated that the patient will be medically stable for discharge from the hospital within 2 midnights of admission.   Author: Norval Morton, MD 05/12/2022 7:14 AM  For on call review [email or web address].

## 2022-05-12 NOTE — ED Notes (Signed)
Pt moved to room 6 at this time 

## 2022-05-12 NOTE — ED Notes (Signed)
Report given to Linda RN.

## 2022-05-12 NOTE — ED Provider Notes (Signed)
[facility] Emergency Department Provider Note MRN:  [id number]  Arrival date & time: 05/12/22     Chief Complaint   Eye Pain and Cerebrovascular Accident   History of Present Illness   Lori Fleming is a 51 y.o. year-old female with a history of hypertension, diabetes, stroke presenting to the ED with chief complaint of eye pain.  Pain in the left eye with vision loss for the past few months.  Was told that she had a glaucoma in the eye.  Had an injection performed in the eye by a specialist.  Was told that there was nothing else that could be done for her left eye.  Has not had any changes to her left eye since then.  Chronic pain and loss of vision in the left eye for a few months.  Here this evening at the request of her family who is concerned that she is still having these symptoms.  Denies numbness or weakness to the arms or legs, no significant change in any other symptoms over the past few weeks.  Review of Systems  A thorough review of systems was obtained and all systems are negative except as noted in the HPI and PMH.   Patient's Health History    Past Medical History:  Diagnosis Date   Diabetes mellitus without complication ([place])    Hypertension    Stroke [facility])     Past Surgical History:  Procedure Laterality Date   TEE WITHOUT CARDIOVERSION N/A 07/10/2021   Procedure: TRANSESOPHAGEAL ECHOCARDIOGRAM (TEE);  Surgeon: Geralynn Rile, MD;  Location: [facility];  Service: Cardiovascular;  Laterality: N/A;    Family History  Problem Relation Age of Onset   Hypertension Father    Diabetes Father    Hypertension Sister    Mental illness Sister    Mental illness Brother    Alcohol abuse Paternal Grandfather    Diabetes Maternal Aunt     Social History   Socioeconomic History   Marital status: Married    Spouse name: Not on file   Number of children: Not on file   Years of education: Not on file   Highest education level: Not on file   Occupational History   Not on file  Tobacco Use   Smoking status: Never   Smokeless tobacco: Never  Substance and Sexual Activity   Alcohol use: No    Alcohol/week: 0.0 standard drinks of alcohol   Drug use: No   Sexual activity: Not on file  Other Topics Concern   Not on file  Social History Narrative   Not on file   Social Determinants of Health   Financial Resource Strain: Not on file  Food Insecurity: Not on file  Transportation Needs: Not on file  Physical Activity: Not on file  Stress: Not on file  Social Connections: Not on file  Intimate Partner Violence: Not on file     Physical Exam   Vitals:   05/12/22 0230 05/12/22 0240  BP: (!) 241/97 (!) 237/106  Pulse: 75 76  Resp:    Temp:  99 F (37.2 C)  SpO2: 98% 98%    CONSTITUTIONAL: Well-appearing, NAD NEURO/PSYCH:  Alert and oriented x 3, normal and symmetric strength and sensation, normal coordination, normal speech EYES: Left eye is without pupillary response, significantly decreased visual acuity, conjunctival erythema ENT/NECK:  no LAD, no JVD CARDIO: Regular rate, well-perfused, normal S1 and S2 PULM:  CTAB no wheezing or rhonchi GI/GU:  non-distended, non-tender MSK/SPINE:  No gross deformities, no edema SKIN:  no rash, atraumatic   *Additional and/or pertinent findings included in MDM below  Diagnostic and Interventional Summary    EKG Interpretation  Date/Time:    Ventricular Rate:    PR Interval:    QRS Duration:   QT Interval:    QTC Calculation:   R Axis:     Text Interpretation:         Labs Reviewed  CBC WITH DIFFERENTIAL/PLATELET - Abnormal; Notable for the following components:      Result Value   Hemoglobin 10.6 (*)    HCT 32.9 (*)    Platelets 483 (*)    All other components within normal limits  URINALYSIS, ROUTINE W REFLEX MICROSCOPIC - Abnormal; Notable for the following components:   APPearance CLOUDY (*)    Specific Gravity, Urine >1.030 (*)    Hgb urine  dipstick SMALL (*)    Bilirubin Urine SMALL (*)    Protein, ur >300 (*)    All other components within normal limits  BASIC METABOLIC PANEL - Abnormal; Notable for the following components:   Glucose, Bld 201 (*)    Creatinine, Ser 2.22 (*)    GFR, Estimated 26 (*)    All other components within normal limits  URINALYSIS, MICROSCOPIC (REFLEX) - Abnormal; Notable for the following components:   Bacteria, UA MANY (*)    All other components within normal limits    MR ORBITS WO CONTRAST  Final Result    MR BRAIN WO CONTRAST  Final Result    CT Head Wo Contrast  Final Result    DG Chest 2 View  Final Result      Medications  labetalol (NORMODYNE) injection 20 mg (has no administration in time range)  oxyCODONE (Oxy IR/ROXICODONE) immediate release tablet 5 mg (5 mg Oral Given 05/12/22 0138)  hydrALAZINE (APRESOLINE) tablet 25 mg (25 mg Oral Given 05/12/22 0153)     Procedures  /  Critical Care .Critical Care  Performed by: Maudie Flakes, MD Authorized by: Maudie Flakes, MD   Critical care provider statement:    Critical care time (minutes):  44   Critical care was necessary to treat or prevent imminent or life-threatening deterioration of the following conditions: Hypertensive urgency.   Critical care was time spent personally by me on the following activities:  Development of treatment plan with patient or surrogate, discussions with consultants, evaluation of patient's response to treatment, examination of patient, ordering and review of laboratory studies, ordering and review of radiographic studies, ordering and performing treatments and interventions, pulse oximetry, re-evaluation of patient's condition and review of old charts   ED Course and Medical Decision Making  Initial Impression and Ddx Chronic left eye pain and vision loss, per chart review she was diagnosed with acute glaucoma of the left eye on 6-25 and was treated with drops.  She consistently denies any  changes to her symptoms, she is only here because her symptoms persist or not getting any better.  And so this is not seem to be an acute process, she may have permanent vision loss or pain in the left eye from glaucoma, other considerations include retinal artery occlusion, vein occlusion, optic neuritis also possible.  Stroke also considered given the report of dizziness, balance issues.  Will obtain MRI imaging.  Patient does not have a GFR compatible with contrast imaging.  Past medical/surgical history that increases complexity of ED encounter: History of acute renal failure, history of glaucoma, history  of TIA  Interpretation of Diagnostics I personally reviewed the laboratory assessment and my interpretation is as follows: Acute kidney injury noted, no significant blood count or electrolyte disturbance  CT and MRI imaging are reassuring, no signs of stroke or obvious orbital pathology.  Patient Reassessment and Ultimate Disposition/Management     Patient has demonstrated severe hypertension here in the emergency department.  Not really responding to oral antihypertensives.  Given this and her creatinine elevation, her continued headache, there is consideration that this is endorgan damage in the setting of hypertensive urgency.  We will request hospitalist admission.  Patient management required discussion with the following services or consulting groups:  Hospitalist Service  Complexity of Problems Addressed Acute illness or injury that poses threat of life of bodily function  Additional Data Reviewed and Analyzed Further history obtained from: Recent discharge summary  Additional Factors Impacting ED Encounter Risk Consideration of hospitalization  Barth Kirks. Sedonia Small, MD [place] [email or web address]  Final Clinical Impressions(s) / ED Diagnoses     ICD-10-CM   1. AKI (acute kidney injury) ([place])  N17.9     2. Hypertensive  urgency  I16.0       ED Discharge Orders     None        Discharge Instructions Discussed with and Provided to Patient:   Discharge Instructions   None      Maudie Flakes, MD 05/12/22 0246

## 2022-05-12 NOTE — ED Notes (Signed)
Unable to get VS because pt gone to scans

## 2022-05-13 DIAGNOSIS — I160 Hypertensive urgency: Secondary | ICD-10-CM | POA: Diagnosis not present

## 2022-05-13 DIAGNOSIS — N179 Acute kidney failure, unspecified: Secondary | ICD-10-CM | POA: Diagnosis not present

## 2022-05-13 DIAGNOSIS — N189 Chronic kidney disease, unspecified: Secondary | ICD-10-CM | POA: Diagnosis not present

## 2022-05-13 LAB — CBC
HCT: 31.6 % — ABNORMAL LOW (ref 36.0–46.0)
Hemoglobin: 10.4 g/dL — ABNORMAL LOW (ref 12.0–15.0)
MCH: 28 pg (ref 26.0–34.0)
MCHC: 32.9 g/dL (ref 30.0–36.0)
MCV: 84.9 fL (ref 80.0–100.0)
Platelets: 468 10*3/uL — ABNORMAL HIGH (ref 150–400)
RBC: 3.72 MIL/uL — ABNORMAL LOW (ref 3.87–5.11)
RDW: 14.2 % (ref 11.5–15.5)
WBC: 10.8 10*3/uL — ABNORMAL HIGH (ref 4.0–10.5)
nRBC: 0 % (ref 0.0–0.2)

## 2022-05-13 LAB — BASIC METABOLIC PANEL
Anion gap: 10 (ref 5–15)
BUN: 25 mg/dL — ABNORMAL HIGH (ref 6–20)
CO2: 22 mmol/L (ref 22–32)
Calcium: 9 mg/dL (ref 8.9–10.3)
Chloride: 103 mmol/L (ref 98–111)
Creatinine, Ser: 2.14 mg/dL — ABNORMAL HIGH (ref 0.44–1.00)
GFR, Estimated: 28 mL/min — ABNORMAL LOW (ref >60)
Glucose, Bld: 145 mg/dL — ABNORMAL HIGH (ref 70–99)
Potassium: 4.3 mmol/L (ref 3.5–5.1)
Sodium: 135 mmol/L (ref 135–145)

## 2022-05-13 LAB — CBG MONITORING, ED: Glucose-Capillary: 162 mg/dL — ABNORMAL HIGH (ref 70–99)

## 2022-05-13 MED ORDER — DORZOLAMIDE HCL-TIMOLOL MAL 2-0.5 % OP SOLN: 1.0000 [drp] | Freq: Two times a day (BID) | OPHTHALMIC | 3 refills | Status: AC

## 2022-05-13 MED ORDER — DIPHENHYDRAMINE HCL 50 MG/ML IJ SOLN
12.5000 mg | Freq: Four times a day (QID) | INTRAMUSCULAR | Status: DC | PRN
Start: 2022-05-13 — End: 2022-05-13
  Administered 2022-05-13: 12.5 mg via INTRAVENOUS
  Filled 2022-05-13: qty 1

## 2022-05-13 MED ORDER — METOCLOPRAMIDE HCL 5 MG/ML IJ SOLN
10.0000 mg | Freq: Four times a day (QID) | INTRAMUSCULAR | Status: DC | PRN
  Administered 2022-05-13: 10 mg via INTRAVENOUS
  Filled 2022-05-13: qty 2

## 2022-05-13 MED ORDER — HYDRALAZINE HCL 25 MG PO TABS: 25.0000 mg | ORAL_TABLET | Freq: Three times a day (TID) | ORAL | 3 refills | Status: DC

## 2022-05-13 NOTE — ED Notes (Signed)
Breakfast order placed ?

## 2022-05-13 NOTE — Discharge Summary (Addendum)
Physician Discharge Summary   Lori Fleming QPR:[id number] DOB: 07-Dec-1970 DOA: 05/11/2022  PCP: Hoyt Koch, MD  Admit date: 05/11/2022 Discharge date: 05/13/2022  Barriers to discharge: none  Admitted From: Home Disposition:  Home Discharging physician: Dwyane Dee, MD  Recommendations for Outpatient Follow-up:  Follow up with ophtho  Home Health:  Equipment/Devices:   Discharge Condition: stable CODE STATUS: Full Diet recommendation:  Diet Orders (From admission, onward)     Start     Ordered   05/13/22 0000  Diet - low sodium heart healthy        05/13/22 1139   05/13/22 0000  Diet Carb Modified        05/13/22 1139   05/12/22 0734  Diet heart healthy/carb modified Room service appropriate? Yes; Fluid consistency: Thin  Diet effective now       Question Answer Comment  Diet-HS Snack? Nothing   Room service appropriate? Yes   Fluid consistency: Thin      05/12/22 0733            Hospital Course:  Hypertensive urgency Patient presented with complaints of headache and left eye pain.  Blood pressures noted to be elevated up to 255/113.  Home blood pressure regimen included atenolol 25 mg daily.  Question if patient's acute kidney injury is secondary to uncontrolled hypertension which would signify endorgan damage.  Patient has been given 25 mg of hydralazine p.o. and labetalol 20 mg IV. -Continue atenolol 25 mg daily -Start hydralazine 25 mg p.o. 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  Patient has known glaucoma of the left eye since June.  MRI of the brain and orbits did not note any acute abnormality.  Discussed case with Dr. Delight Hoh on-call for ophthalmology who specializes in glaucoma.  After reviewing patient's case he noted that this is more palliative in nature due to the lack of light perception, and that her eye pressure may be continuing to go up causing her symptoms.   Recommended changing timolol drops to dorzolamide-timolol in attempt to lower patient's eye pressure.  He also recommended considering methazolamide 25 mg twice daily to be increased up to 50 mg twice daily, but recommended checking with pharmacy given patient's kidney function.  -Oxycodone as needed for pain -Continue brimonidine drops as prescribed -Discontinued timolol and started dorzolamide-timolol 1 drop twice daily as per recommendations  -Did not start methazolamide due to sharing similar reaction to acetazolamide and patient having acute kidney injury -Patient is established at [facility] ophthalmology for follow-up but if needed she may also follow-up with Dr. Tobe Sos if needed   Headache Acute.  Patient presented with complaints of headache.  Suspect patient's blood pressure and/or eye issues.  -Control blood pressures as noted above   Acute kidney injury superimposed on chronic kidney disease stage IIIb Creatinine noted to be elevated up to 2.22 with BUN 20.  Baseline creatinine previously had been 1.79 on 8/2.  This is greater than a 0.3 increased from baseline to suggest acute kidney injury. -Creatinine stable with slight improvement at discharge -Renal ultrasound in June shows medical renal disease.  No need for repeat at this time   Anemia of chronic disease Hemoglobin 10.6 g/dL with normal MCV and MCH which appears near patient's baseline.   Thrombocytosis Platelet count 483.  Suspect reactive to above.   Uncontrolled diabetes mellitus type 2 with hyperglycemia On admission glucose 201.  Last Hemoglobin A1c 7.7.  Home medication regimen includes Januvia 50 mg daily. Patient with prior history of retinal detachment in both eyes that had been repaired thought likely related to her uncontrolled diabetes.    The patient's chronic medical conditions were treated accordingly per the patient's home medication regimen except as noted.  On day of discharge, patient was felt  deemed stable for discharge. Patient/family member advised to call PCP or come back to ER if needed.   Principal Diagnosis: Hypertensive urgency  Discharge Diagnoses: Active Hospital Problems   Diagnosis Date Noted   Hypertensive urgency 05/12/2022    Priority: 1.   Glaucoma 03/22/2022    Priority: 2.   Headache 05/12/2022    Priority: 3.   Acute kidney injury superimposed on chronic kidney disease ([place]) 03/22/2022    Priority: 4.   Anemia of chronic disease 05/12/2022    Priority: 5.   Thrombocytosis 06/18/2021    Priority: 6.   Type 2 diabetes with complication ([place]) 97/98/9211    Priority: 7.    Resolved Hospital Problems   Diagnosis Date Noted Date Resolved   Normocytic anemia 06/18/2021 05/12/2022     Discharge Instructions     Diet - low sodium heart healthy   Complete by: As directed    Diet Carb Modified   Complete by: As directed    Increase activity slowly   Complete by: As directed       Allergies as of 05/13/2022   No Known Allergies      Medication List     STOP taking these medications    timolol 0.5 % ophthalmic solution Commonly known as: TIMOPTIC       TAKE these medications    acetaminophen 500 MG tablet Commonly known as: TYLENOL Take 1,000 mg by mouth every 6 (six) hours as needed for mild pain or moderate pain.   aspirin EC 81 MG tablet Take 81 mg by mouth daily. Swallow whole.   atenolol 25 MG tablet Commonly known as: TENORMIN TAKE 1 TABLET (25 MG TOTAL) BY MOUTH DAILY.   brimonidine 0.2 % ophthalmic solution Commonly known as: ALPHAGAN Place 1 drop into the left eye 3 (three) times daily.   dorzolamide-timolol 22.3-6.8 MG/ML ophthalmic solution Commonly known as: COSOPT Place 1 drop into the left eye 2 (two) times daily.   hydrALAZINE 25 MG tablet Commonly known as: APRESOLINE Take 1 tablet (25 mg total) by mouth every 8 (eight) hours.   ondansetron 4 MG tablet Commonly known as: Zofran Take 1 tablet (4 mg total)  by mouth every 8 (eight) hours as needed for nausea or vomiting.   rosuvastatin 10 MG tablet Commonly known as: CRESTOR Take 1 tablet (10 mg total) by mouth daily. What changed: when to take this   sitaGLIPtin 50 MG tablet Commonly known as: Januvia Take 1 tablet (50 mg total) by mouth daily.        No Known Allergies  Consultations:   Procedures:   Discharge Exam: BP (!) 155/82   Pulse 80   Temp 99 F (37.2 C) (Oral)   Resp 13   Ht 5\' 6"  (1.676 m)   Wt 68 kg   SpO2 98%   BMI 24.21 kg/m  Physical Exam Constitutional:      General: She is not in acute distress.    Appearance: Normal appearance.  HENT:     Head: Normocephalic and atraumatic.     Mouth/Throat:     Mouth: Mucous membranes are moist.  Eyes:  Comments: Opacified left eye with no visual acuity.  Right pupil reactive to light and gross visual acuity intact  Cardiovascular:     Rate and Rhythm: Normal rate and regular rhythm.  Pulmonary:     Effort: Pulmonary effort is normal.     Breath sounds: Normal breath sounds.  Abdominal:     General: Bowel sounds are normal. There is no distension.     Palpations: Abdomen is soft.     Tenderness: There is no abdominal tenderness.  Musculoskeletal:        General: Normal range of motion.     Cervical back: Normal range of motion and neck supple.  Skin:    General: Skin is warm and dry.  Neurological:     Mental Status: She is alert.     Comments: Vision at baseline.  No other focal deficits  Psychiatric:        Mood and Affect: Mood normal.        Behavior: Behavior normal.      The results of significant diagnostics from this hospitalization (including imaging, microbiology, ancillary and laboratory) are listed below for reference.   Microbiology: No results found for this or any previous visit (from the past 240 hour(s)).   Labs: BNP (last 3 results) No results for input(s): "BNP" in the last 8760 hours. Basic Metabolic Panel: Recent Labs   Lab 05/11/22 2011 05/13/22 0216  NA 137 135  K 4.7 4.3  CL 105 103  CO2 23 22  GLUCOSE 201* 145*  BUN 20 25*  CREATININE 2.22* 2.14*  CALCIUM 9.5 9.0   Liver Function Tests: No results for input(s): "AST", "ALT", "ALKPHOS", "BILITOT", "PROT", "ALBUMIN" in the last 168 hours. No results for input(s): "LIPASE", "AMYLASE" in the last 168 hours. No results for input(s): "AMMONIA" in the last 168 hours. CBC: Recent Labs  Lab 05/11/22 2011 05/13/22 0216  WBC 8.5 10.8*  NEUTROABS 6.5  --   HGB 10.6* 10.4*  HCT 32.9* 31.6*  MCV 85.0 84.9  PLT 483* 468*   Cardiac Enzymes: No results for input(s): "CKTOTAL", "CKMB", "CKMBINDEX", "TROPONINI" in the last 168 hours. BNP: Invalid input(s): "POCBNP" CBG: Recent Labs  Lab 05/12/22 0818 05/12/22 1434 05/12/22 1638 05/12/22 2119 05/13/22 0658  GLUCAP 218* 207* 181* 154* 162*   D-Dimer No results for input(s): "DDIMER" in the last 72 hours. Hgb A1c No results for input(s): "HGBA1C" in the last 72 hours. Lipid Profile No results for input(s): "CHOL", "HDL", "LDLCALC", "TRIG", "CHOLHDL", "LDLDIRECT" in the last 72 hours. Thyroid function studies No results for input(s): "TSH", "T4TOTAL", "T3FREE", "THYROIDAB" in the last 72 hours.  Invalid input(s): "FREET3" Anemia work up No results for input(s): "VITAMINB12", "FOLATE", "FERRITIN", "TIBC", "IRON", "RETICCTPCT" in the last 72 hours. Urinalysis    Component Value Date/Time   COLORURINE YELLOW 05/11/2022 2013   APPEARANCEUR CLOUDY (A) 05/11/2022 2013   LABSPEC >1.030 (H) 05/11/2022 2013   PHURINE 5.5 05/11/2022 2013   GLUCOSEU NEGATIVE 05/11/2022 2013   HGBUR SMALL (A) 05/11/2022 2013   BILIRUBINUR SMALL (A) 05/11/2022 2013   [place] NEGATIVE 05/11/2022 2013   PROTEINUR >300 (A) 05/11/2022 2013   NITRITE NEGATIVE 05/11/2022 2013   LEUKOCYTESUR NEGATIVE 05/11/2022 2013   Sepsis Labs Recent Labs  Lab 05/11/22 2011 05/13/22 0216  WBC 8.5 10.8*   Microbiology No  results found for this or any previous visit (from the past 240 hour(s)).  Procedures/Studies: MR BRAIN WO CONTRAST  Result Date: 05/12/2022 CLINICAL DATA:  Monocular vision  loss EXAM: MRI HEAD AND ORBITS WITHOUT CONTRAST TECHNIQUE: Multiplanar, multiecho pulse sequences of the brain and surrounding structures were obtained without and with intravenous contrast. Multiplanar, multiecho pulse sequences of the orbits and surrounding structures were obtained including fat saturation techniques, before and after intravenous contrast administration. COMPARISON:  MRI head 06/19/2021, no prior MRI orbits FINDINGS: MRI HEAD FINDINGS Brain: No restricted diffusion to suggest acute or subacute infarct. No acute hemorrhage, mass, mass effect, or midline shift. No hydrocephalus or extra-axial collection. 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 T2 hyperintense signal in the periventricular white matter and pons, likely the sequela of chronic small vessel ischemic disease. Vascular: Normal arterial flow voids. Skull and upper cervical spine: Normal marrow signal. Other: The mastoids are well aerated. MRI ORBITS FINDINGS Orbits: No traumatic or inflammatory finding. Globes, optic nerves, orbital fat, extraocular muscles, vascular structures, and lacrimal glands are normal. Visualized sinuses: Minimal mucosal thickening in the ethmoid air cells. Soft tissues: Negative. IMPRESSION: 1. No acute intracranial process. No evidence of acute or subacute infarct. 2. No acute finding in the orbits. Electronically Signed   By: Merilyn Baba M.D.   On: 05/12/2022 02:08   MR ORBITS WO CONTRAST  Result Date: 05/12/2022 CLINICAL DATA:  Monocular vision loss EXAM: MRI HEAD AND ORBITS WITHOUT CONTRAST TECHNIQUE: Multiplanar, multiecho pulse sequences of the brain and surrounding structures were obtained without and with intravenous contrast. Multiplanar,  multiecho pulse sequences of the orbits and surrounding structures were obtained including fat saturation techniques, before and after intravenous contrast administration. COMPARISON:  MRI head 06/19/2021, no prior MRI orbits FINDINGS: MRI HEAD FINDINGS Brain: No restricted diffusion to suggest acute or subacute infarct. No acute hemorrhage, mass, mass effect, or midline shift. No hydrocephalus or extra-axial collection. 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 T2 hyperintense signal in the periventricular white matter and pons, likely the sequela of chronic small vessel ischemic disease. Vascular: Normal arterial flow voids. Skull and upper cervical spine: Normal marrow signal. Other: The mastoids are well aerated. MRI ORBITS FINDINGS Orbits: No traumatic or inflammatory finding. Globes, optic nerves, orbital fat, extraocular muscles, vascular structures, and lacrimal glands are normal. Visualized sinuses: Minimal mucosal thickening in the ethmoid air cells. Soft tissues: Negative. IMPRESSION: 1. No acute intracranial process. No evidence of acute or subacute infarct. 2. No acute finding in the orbits. Electronically Signed   By: Merilyn Baba M.D.   On: 05/12/2022 02:08   CT Head Wo Contrast  Result Date: 05/11/2022 CLINICAL DATA:  Headache, new or worsening, post traumatic (Age 49-49y) EXAM: CT HEAD WITHOUT CONTRAST TECHNIQUE: Contiguous axial images were obtained from the base of the skull through the vertex without intravenous contrast. RADIATION DOSE REDUCTION: This exam was performed according to the departmental dose-optimization program which includes automated exposure control, adjustment of the mA and/or kV according to patient size and/or use of iterative reconstruction technique. COMPARISON:  MRI head 06/19/2021 FINDINGS: Brain: No evidence of large-territorial acute infarction. No parenchymal hemorrhage. No mass  lesion. No extra-axial collection. No mass effect or midline shift. No hydrocephalus. Basilar cisterns are patent. Vascular: No hyperdense vessel. Skull: No acute fracture or focal lesion. Sinuses/Orbits: Paranasal sinuses and mastoid air cells are clear. The orbits are unremarkable. Other: None. IMPRESSION: No acute intracranial abnormality. Electronically Signed   By: Clelia Croft.D.  On: 05/11/2022 23:39   DG Chest 2 View  Result Date: 05/11/2022 CLINICAL DATA:  Pneumonia EXAM: CHEST - 2 VIEW COMPARISON:  None Available. FINDINGS: The heart size and mediastinal contours are within normal limits. Both lungs are clear. The visualized skeletal structures are unremarkable. IMPRESSION: No active cardiopulmonary disease. Electronically Signed   By: Fidela Salisbury M.D.   On: 05/11/2022 20:46     Time coordinating discharge: Over 30 minutes    Dwyane Dee, MD  [facility] 05/13/2022, 12:43 PM

## 2022-05-13 NOTE — ED Notes (Signed)
Pt ate breakfast.

## 2022-05-14 DIAGNOSIS — H40052 Ocular hypertension, left eye: Secondary | ICD-10-CM | POA: Diagnosis not present

## 2022-05-14 DIAGNOSIS — H5712 Ocular pain, left eye: Secondary | ICD-10-CM | POA: Diagnosis not present

## 2022-05-14 DIAGNOSIS — H40002 Preglaucoma, unspecified, left eye: Secondary | ICD-10-CM | POA: Diagnosis not present

## 2022-05-14 DIAGNOSIS — E113511 Type 2 diabetes mellitus with proliferative diabetic retinopathy with macular edema, right eye: Secondary | ICD-10-CM | POA: Diagnosis not present

## 2022-05-14 DIAGNOSIS — H11432 Conjunctival hyperemia, left eye: Secondary | ICD-10-CM | POA: Diagnosis not present

## 2022-05-17 DIAGNOSIS — E113512 Type 2 diabetes mellitus with proliferative diabetic retinopathy with macular edema, left eye: Secondary | ICD-10-CM | POA: Diagnosis not present

## 2022-05-18 ENCOUNTER — Ambulatory Visit: Payer: BC Managed Care – PPO | Admitting: Internal Medicine

## 2022-05-23 ENCOUNTER — Ambulatory Visit (INDEPENDENT_AMBULATORY_CARE_PROVIDER_SITE_OTHER): Payer: BC Managed Care – PPO | Admitting: Internal Medicine

## 2022-05-23 ENCOUNTER — Encounter: Payer: Self-pay | Admitting: Internal Medicine

## 2022-05-23 VITALS — BP 110/80 | HR 81 | Temp 98.6°F | Ht 66.0 in | Wt 139.0 lb

## 2022-05-23 DIAGNOSIS — N184 Chronic kidney disease, stage 4 (severe): Secondary | ICD-10-CM

## 2022-05-23 DIAGNOSIS — H402220 Chronic angle-closure glaucoma, left eye, stage unspecified: Secondary | ICD-10-CM

## 2022-05-23 DIAGNOSIS — I10 Essential (primary) hypertension: Secondary | ICD-10-CM | POA: Diagnosis not present

## 2022-05-23 LAB — RENAL FUNCTION PANEL
Albumin: 4 g/dL (ref 3.5–5.2)
BUN: 22 mg/dL (ref 6–23)
CO2: 20 mEq/L (ref 19–32)
Calcium: 9 mg/dL (ref 8.4–10.5)
Chloride: 109 mEq/L (ref 96–112)
Creatinine, Ser: 1.81 mg/dL — ABNORMAL HIGH (ref 0.40–1.20)
GFR: 32.19 mL/min — ABNORMAL LOW (ref >60.00)
Glucose, Bld: 183 mg/dL — ABNORMAL HIGH (ref 70–99)
Phosphorus: 3.2 mg/dL (ref 2.3–4.6)
Potassium: 4.1 mEq/L (ref 3.5–5.1)
Sodium: 137 mEq/L (ref 135–145)

## 2022-05-23 MED ORDER — ATENOLOL 25 MG PO TABS: 25.0000 mg | ORAL_TABLET | Freq: Every day | ORAL | 3 refills | Status: DC

## 2022-05-23 MED ORDER — HYDRALAZINE HCL 25 MG PO TABS
25.0000 mg | ORAL_TABLET | Freq: Two times a day (BID) | ORAL | 3 refills | Status: DC
Start: 2022-05-23 — End: 2023-12-23

## 2022-05-23 NOTE — Progress Notes (Signed)
   Subjective:   Patient ID: Lori Fleming, female    DOB: [birth year], 51 y.o.   MRN: [id number]  HPI The patient is a 52 YO female coming in for hospital follow up. Had elevated BP and renal numbers kept in ER for observation.  PMH, [facility], social history reviewed and updated  Review of Systems  Constitutional: Negative.   HENT: Negative.    Eyes:  Positive for visual disturbance.  Respiratory:  Negative for cough, chest tightness and shortness of breath.   Cardiovascular:  Negative for chest pain, palpitations and leg swelling.  Gastrointestinal:  Negative for abdominal distention, abdominal pain, constipation, diarrhea, nausea and vomiting.  Musculoskeletal: Negative.   Skin: Negative.   Neurological:  Positive for headaches.  Psychiatric/Behavioral: Negative.      Objective:  Physical Exam Constitutional:      Appearance: She is well-developed. She is ill-appearing.  HENT:     Head: Normocephalic and atraumatic.  Cardiovascular:     Rate and Rhythm: Normal rate and regular rhythm.  Pulmonary:     Effort: Pulmonary effort is normal. No respiratory distress.     Breath sounds: Normal breath sounds. No wheezing or rales.  Abdominal:     General: Bowel sounds are normal. There is no distension.     Palpations: Abdomen is soft.     Tenderness: There is no abdominal tenderness. There is no rebound.  Musculoskeletal:        General: Tenderness present.     Cervical back: Normal range of motion.  Skin:    General: Skin is warm and dry.  Neurological:     Mental Status: She is alert and oriented to person, place, and time.     Coordination: Coordination normal.     Vitals:   05/23/22 0859  BP: 110/80  Pulse: 81  Temp: 98.6 F (37 C)  TempSrc: Oral  SpO2: 97%  Weight: 139 lb (63 kg)  Height: 5\' 6"  (1.676 m)    Assessment & Plan:

## 2022-05-23 NOTE — Patient Instructions (Addendum)
We will change the hydralazine dose to 1 pill twice a day.  We will check the kidney numbers today and get you in with a kidney doctor for long term monitoring.

## 2022-05-24 DIAGNOSIS — H40052 Ocular hypertension, left eye: Secondary | ICD-10-CM | POA: Diagnosis not present

## 2022-05-24 DIAGNOSIS — H2102 Hyphema, left eye: Secondary | ICD-10-CM | POA: Diagnosis not present

## 2022-05-24 DIAGNOSIS — H35371 Puckering of macula, right eye: Secondary | ICD-10-CM | POA: Diagnosis not present

## 2022-05-24 DIAGNOSIS — H5712 Ocular pain, left eye: Secondary | ICD-10-CM | POA: Diagnosis not present

## 2022-05-25 DIAGNOSIS — N184 Chronic kidney disease, stage 4 (severe): Secondary | ICD-10-CM | POA: Insufficient documentation

## 2022-05-25 NOTE — Assessment & Plan Note (Signed)
She is taking hydralazine only BID so will change rx to reflect this. Continue hydralazine 25 mg BID and atenolol 25 mg daily and is on acetazolamide 250 mg BID. Checking renal function today for stability.

## 2022-05-25 NOTE — Assessment & Plan Note (Signed)
Referral to nephrology and recheck renal function. She has had slightly labile labs with elevated BP recently but it appears likely baseline will be CKD stage 4.

## 2022-05-25 NOTE — Assessment & Plan Note (Signed)
ER was suspicious this was causing headaches/eye pain. Started acetazolamide about 1 week ago and needs renal panel today. If worsening creatinine should stop. She did have AKI with this previously.

## 2022-05-30 ENCOUNTER — Other Ambulatory Visit (INDEPENDENT_AMBULATORY_CARE_PROVIDER_SITE_OTHER): Payer: BC Managed Care – PPO

## 2022-05-30 ENCOUNTER — Ambulatory Visit: Payer: BC Managed Care – PPO | Admitting: Internal Medicine

## 2022-05-30 ENCOUNTER — Other Ambulatory Visit: Payer: Self-pay | Admitting: Internal Medicine

## 2022-05-30 DIAGNOSIS — N184 Chronic kidney disease, stage 4 (severe): Secondary | ICD-10-CM | POA: Diagnosis not present

## 2022-05-30 LAB — RENAL FUNCTION PANEL
Albumin: 3.6 g/dL (ref 3.5–5.2)
BUN: 30 mg/dL — ABNORMAL HIGH (ref 6–23)
CO2: 18 mEq/L — ABNORMAL LOW (ref 19–32)
Calcium: 8.9 mg/dL (ref 8.4–10.5)
Chloride: 113 mEq/L — ABNORMAL HIGH (ref 96–112)
Creatinine, Ser: 1.92 mg/dL — ABNORMAL HIGH (ref 0.40–1.20)
GFR: 29.98 mL/min — ABNORMAL LOW (ref >60.00)
Glucose, Bld: 144 mg/dL — ABNORMAL HIGH (ref 70–99)
Phosphorus: 3.7 mg/dL (ref 2.3–4.6)
Potassium: 4 mEq/L (ref 3.5–5.1)
Sodium: 139 mEq/L (ref 135–145)

## 2022-05-30 NOTE — Progress Notes (Signed)
Error

## 2022-05-31 DIAGNOSIS — H11432 Conjunctival hyperemia, left eye: Secondary | ICD-10-CM | POA: Diagnosis not present

## 2022-05-31 DIAGNOSIS — H5712 Ocular pain, left eye: Secondary | ICD-10-CM | POA: Diagnosis not present

## 2022-05-31 DIAGNOSIS — H40052 Ocular hypertension, left eye: Secondary | ICD-10-CM | POA: Diagnosis not present

## 2022-05-31 DIAGNOSIS — H40002 Preglaucoma, unspecified, left eye: Secondary | ICD-10-CM | POA: Diagnosis not present

## 2022-05-31 DIAGNOSIS — E113511 Type 2 diabetes mellitus with proliferative diabetic retinopathy with macular edema, right eye: Secondary | ICD-10-CM | POA: Diagnosis not present

## 2022-06-20 DIAGNOSIS — E113512 Type 2 diabetes mellitus with proliferative diabetic retinopathy with macular edema, left eye: Secondary | ICD-10-CM | POA: Diagnosis not present

## 2022-07-04 DIAGNOSIS — E113511 Type 2 diabetes mellitus with proliferative diabetic retinopathy with macular edema, right eye: Secondary | ICD-10-CM | POA: Diagnosis not present

## 2022-07-05 ENCOUNTER — Other Ambulatory Visit: Payer: Self-pay | Admitting: Internal Medicine

## 2022-07-05 NOTE — Telephone Encounter (Signed)
This ok to refill?

## 2022-07-06 NOTE — Telephone Encounter (Signed)
If she is checking sugars and using this type okay to refill

## 2022-07-09 NOTE — Telephone Encounter (Signed)
LVM--to call the office back test strip verification.

## 2022-07-25 DIAGNOSIS — E113512 Type 2 diabetes mellitus with proliferative diabetic retinopathy with macular edema, left eye: Secondary | ICD-10-CM | POA: Diagnosis not present

## 2022-08-06 DIAGNOSIS — H4089 Other specified glaucoma: Secondary | ICD-10-CM | POA: Diagnosis not present

## 2022-08-06 DIAGNOSIS — H2513 Age-related nuclear cataract, bilateral: Secondary | ICD-10-CM | POA: Diagnosis not present

## 2022-08-08 DIAGNOSIS — E113511 Type 2 diabetes mellitus with proliferative diabetic retinopathy with macular edema, right eye: Secondary | ICD-10-CM | POA: Diagnosis not present

## 2022-08-24 ENCOUNTER — Encounter: Payer: Self-pay | Admitting: Internal Medicine

## 2022-08-24 ENCOUNTER — Ambulatory Visit (INDEPENDENT_AMBULATORY_CARE_PROVIDER_SITE_OTHER): Payer: BC Managed Care – PPO | Admitting: Internal Medicine

## 2022-08-24 VITALS — BP 140/80 | HR 72 | Temp 98.3°F | Ht 66.0 in | Wt 155.0 lb

## 2022-08-24 DIAGNOSIS — E118 Type 2 diabetes mellitus with unspecified complications: Secondary | ICD-10-CM

## 2022-08-24 DIAGNOSIS — I10 Essential (primary) hypertension: Secondary | ICD-10-CM | POA: Diagnosis not present

## 2022-08-24 DIAGNOSIS — D638 Anemia in other chronic diseases classified elsewhere: Secondary | ICD-10-CM

## 2022-08-24 DIAGNOSIS — N184 Chronic kidney disease, stage 4 (severe): Secondary | ICD-10-CM | POA: Diagnosis not present

## 2022-08-24 DIAGNOSIS — M79641 Pain in right hand: Secondary | ICD-10-CM | POA: Insufficient documentation

## 2022-08-24 DIAGNOSIS — Z23 Encounter for immunization: Secondary | ICD-10-CM

## 2022-08-24 LAB — HEMOGLOBIN A1C: Hgb A1c MFr Bld: 6.8 % — ABNORMAL HIGH (ref 4.6–6.5)

## 2022-08-24 LAB — COMPREHENSIVE METABOLIC PANEL
ALT: 9 U/L (ref 0–35)
AST: 11 U/L (ref 0–37)
Albumin: 3.8 g/dL (ref 3.5–5.2)
Alkaline Phosphatase: 47 U/L (ref 39–117)
BUN: 25 mg/dL — ABNORMAL HIGH (ref 6–23)
CO2: 25 mEq/L (ref 19–32)
Calcium: 8.9 mg/dL (ref 8.4–10.5)
Chloride: 108 mEq/L (ref 96–112)
Creatinine, Ser: 1.68 mg/dL — ABNORMAL HIGH (ref 0.40–1.20)
GFR: 35.13 mL/min — ABNORMAL LOW (ref >60.00)
Glucose, Bld: 101 mg/dL — ABNORMAL HIGH (ref 70–99)
Potassium: 5 mEq/L (ref 3.5–5.1)
Sodium: 138 mEq/L (ref 135–145)
Total Bilirubin: 0.3 mg/dL (ref 0.2–1.2)
Total Protein: 6.8 g/dL (ref 6.0–8.3)

## 2022-08-24 LAB — CBC
HCT: 28.2 % — ABNORMAL LOW (ref 36.0–46.0)
Hemoglobin: 9.4 g/dL — ABNORMAL LOW (ref 12.0–15.0)
MCHC: 33.2 g/dL (ref 30.0–36.0)
MCV: 86.6 fl (ref 78.0–100.0)
Platelets: 411 10*3/uL — ABNORMAL HIGH (ref 150.0–400.0)
RBC: 3.26 Mil/uL — ABNORMAL LOW (ref 3.87–5.11)
RDW: 15.1 % (ref 11.5–15.5)
WBC: 6.4 10*3/uL (ref 4.0–10.5)

## 2022-08-24 MED ORDER — DICLOFENAC SODIUM 1 % EX GEL
2.0000 g | Freq: Four times a day (QID) | CUTANEOUS | 3 refills | Status: DC
Start: 2022-08-24 — End: 2023-12-23

## 2022-08-24 NOTE — Assessment & Plan Note (Signed)
Checking CMP as due. BP elevated today due to not taking hydralazine which puts her at risk for progression of CKD.

## 2022-08-24 NOTE — Assessment & Plan Note (Signed)
Bp moderately elevated due to missing hydralazine today so far. She is taking atenolol 25 mg daily and hydralazine 25 mg BID. BP previously at goal with current meds. Encouraged good compliance as she has CKD 4 and this could progress with poorly controlled BP readings.

## 2022-08-24 NOTE — Assessment & Plan Note (Signed)
Checking CBC and due to CKD stage 4. Adjust as needed.

## 2022-08-24 NOTE — Patient Instructions (Addendum)
We have sent in the topical medicine to use up to 4 times a day on the wrist and hand to help.  We will check the labs and have given you the flu shot today.

## 2022-08-24 NOTE — Assessment & Plan Note (Signed)
Suspect tendonitis. Cannot do oral NSAIDs due to CKD 4 and cannot do oral prednisone given glaucoma uncontrolled. Rx topical diclofenac gel to use up to QID short term.

## 2022-08-24 NOTE — Progress Notes (Signed)
   Subjective:   Patient ID: Lori Fleming, female    DOB: 1971/08/28, 51 y.o.   MRN: [id number]  HPI The patient is a 51 YO female coming in for acute visit for hand pain/stiffness. Going on about 1 month.  Review of Systems  Constitutional: Negative.   HENT: Negative.    Eyes: Negative.   Respiratory:  Negative for cough, chest tightness and shortness of breath.   Cardiovascular:  Negative for chest pain, palpitations and leg swelling.  Gastrointestinal:  Negative for abdominal distention, abdominal pain, constipation, diarrhea, nausea and vomiting.  Musculoskeletal:  Positive for arthralgias and myalgias.  Skin: Negative.   Neurological: Negative.   Psychiatric/Behavioral: Negative.      Objective:  Physical Exam Constitutional:      Appearance: She is well-developed.  HENT:     Head: Normocephalic and atraumatic.  Cardiovascular:     Rate and Rhythm: Normal rate and regular rhythm.  Pulmonary:     Effort: Pulmonary effort is normal. No respiratory distress.     Breath sounds: Normal breath sounds. No wheezing or rales.  Abdominal:     General: Bowel sounds are normal. There is no distension.     Palpations: Abdomen is soft.     Tenderness: There is no abdominal tenderness. There is no rebound.  Musculoskeletal:        General: Tenderness present.     Cervical back: Normal range of motion.     Comments: Limitation ROM right hand and wrist with pain on flexion fingers  Skin:    General: Skin is warm and dry.  Neurological:     Mental Status: She is alert and oriented to person, place, and time.     Coordination: Coordination normal.     Vitals:   08/24/22 1416 08/24/22 1422  BP: (!) 180/100 (!) 180/80  Pulse: 72   Temp: 98.3 F (36.8 C)   TempSrc: Oral   SpO2: 98%   Weight: 155 lb (70.3 kg)   Height: 5\' 6"  (1.676 m)     Assessment & Plan:  Flu shot given at visit

## 2022-08-24 NOTE — Assessment & Plan Note (Signed)
Checking HgA1c and adjust januvia 50 mg daily as needed.

## 2022-08-27 DIAGNOSIS — E113512 Type 2 diabetes mellitus with proliferative diabetic retinopathy with macular edema, left eye: Secondary | ICD-10-CM | POA: Diagnosis not present

## 2022-09-05 DIAGNOSIS — E113511 Type 2 diabetes mellitus with proliferative diabetic retinopathy with macular edema, right eye: Secondary | ICD-10-CM | POA: Diagnosis not present

## 2022-09-05 DIAGNOSIS — H3582 Retinal ischemia: Secondary | ICD-10-CM | POA: Diagnosis not present

## 2022-09-05 DIAGNOSIS — H3581 Retinal edema: Secondary | ICD-10-CM | POA: Diagnosis not present

## 2022-09-05 DIAGNOSIS — H31091 Other chorioretinal scars, right eye: Secondary | ICD-10-CM | POA: Diagnosis not present

## 2022-09-10 DIAGNOSIS — E113511 Type 2 diabetes mellitus with proliferative diabetic retinopathy with macular edema, right eye: Secondary | ICD-10-CM | POA: Diagnosis not present

## 2022-09-19 IMAGING — MR MR HEAD W/O CM
12 of 13 series · 41 of 48 positions shown · non-contrast
Comparison: CT from 06/18/2021.

CLINICAL DATA: Neuro deficit, acute, stroke suspected

EXAM:
MRI HEAD WITHOUT CONTRAST
TECHNIQUE: Multiplanar, multiecho pulse sequences of the brain and surrounding
structures were obtained without intravenous contrast.

[Series 5: DWI · axial · 4.0mm · 0.88mm/px · z∈[-79,+59]mm · 5 of 36 slices shown (1 of 6)]
[im 1/36]
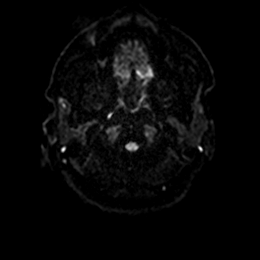
[im 9/36]
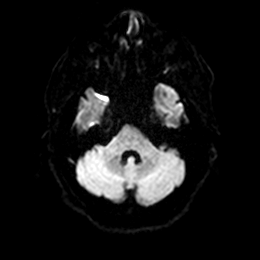
[im 18/36]
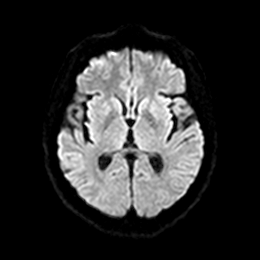
[im 27/36]
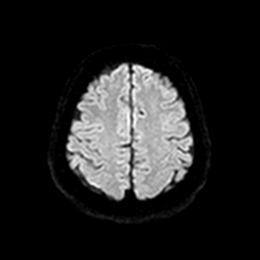
[im 36/36]
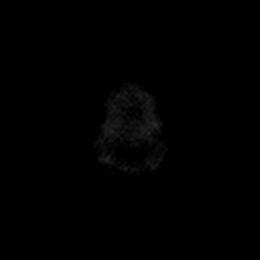

[Series 5: DWI · axial · 4.0mm · 0.88mm/px · z∈[-79,+59]mm · 5 of 36 slices shown (2 of 6)]
[im 1/36]
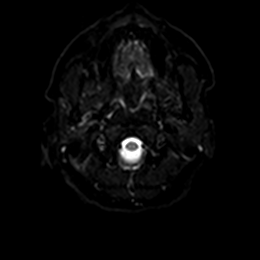
[im 9/36]
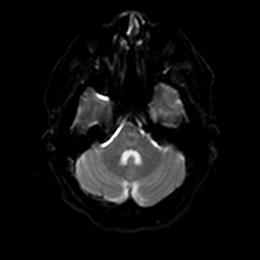
[im 18/36]
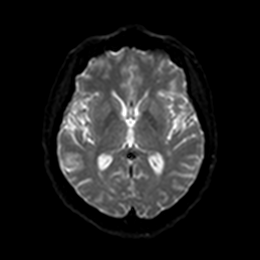
[im 27/36]
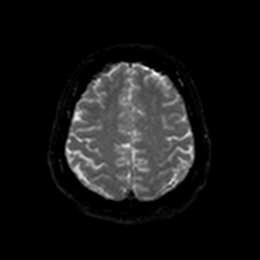
[im 36/36]
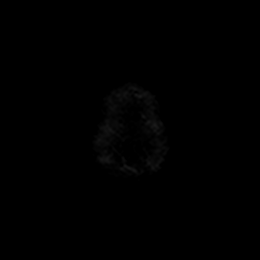

[Series 6: DWI · axial · 4.0mm · 0.88mm/px · z∈[-79,+59]mm · 5 of 36 slices shown (3 of 6)]
[im 1/36]
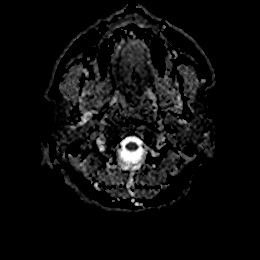
[im 9/36]
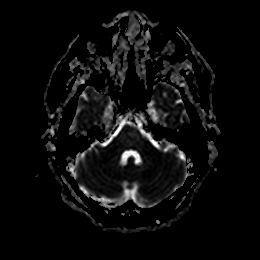
[im 18/36]
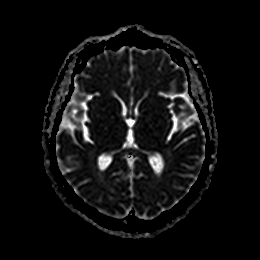
[im 27/36]
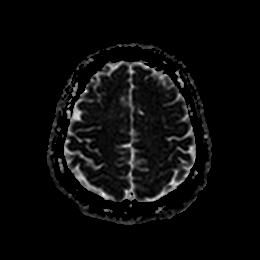
[im 36/36]
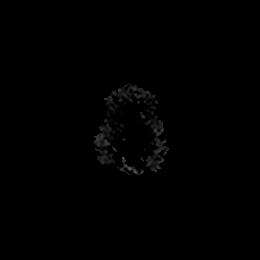

[Series 7: DWI · coronal · 5.0mm · 0.88mm/px · 3 of 28 slices shown (4 of 6)]
[im 1/28]
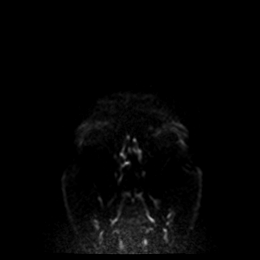
[im 14/28]
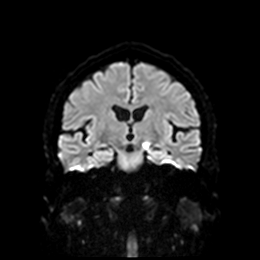
[im 28/28]
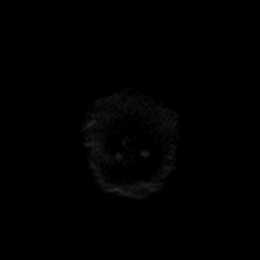

[Series 7: DWI · coronal · 5.0mm · 0.88mm/px · 3 of 28 slices shown (5 of 6)]
[im 1/28]
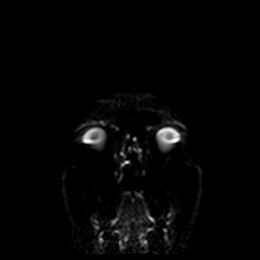
[im 14/28]
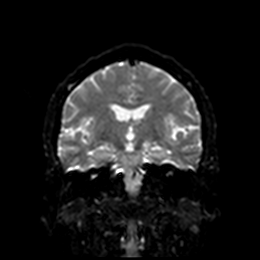
[im 28/28]
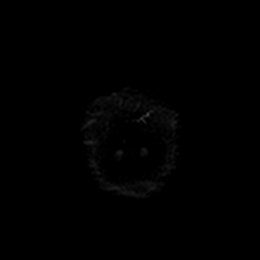

[Series 8: DWI · coronal · 5.0mm · 0.88mm/px · 3 of 28 slices shown (6 of 6)]
[im 1/28]
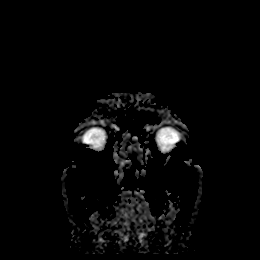
[im 14/28]
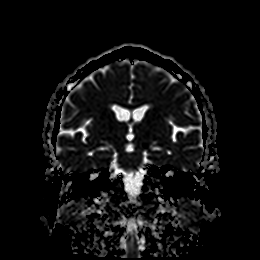
[im 28/28]
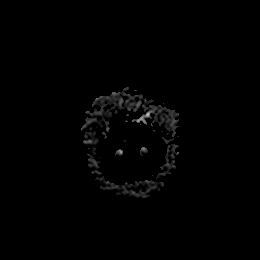

[Series 9: T1 · sagittal · 5.0mm · 0.94mm/px · 3 of 25 slices shown]
[im 1/25]
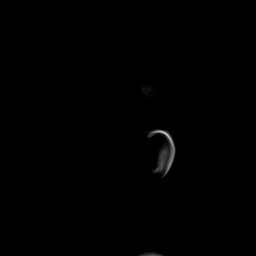
[im 13/25]
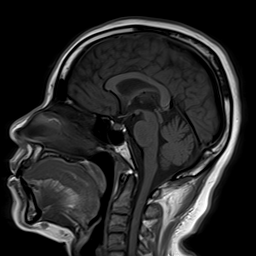
[im 25/25]
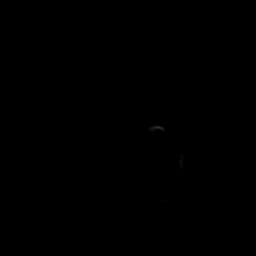

[Series 10: T2 · axial · 5.0mm · 0.72mm/px · z∈[-76,+56]mm · 2 of 20 slices shown (1 of 3)]
[im 1/20]
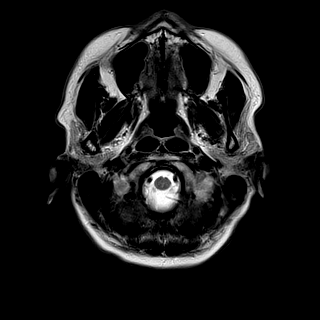
[im 20/20]
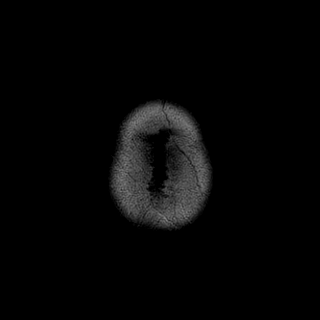

[Series 11: T2 · axial · 5.0mm · 0.72mm/px · z∈[-76,+56]mm · 2 of 20 slices shown (2 of 3)]
[im 1/20]
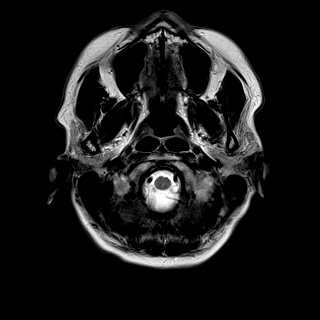
[im 20/20]
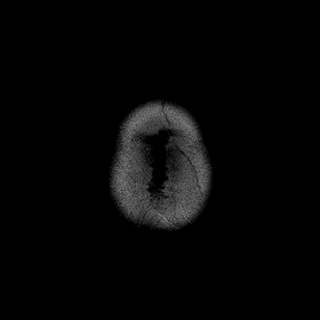

[Series 12: ax hemo · axial · 5.0mm · 0.86mm/px · z∈[-79,+63]mm · 3 of 25 slices shown]
[im 1/25]
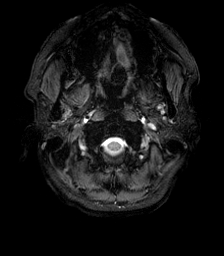
[im 13/25]
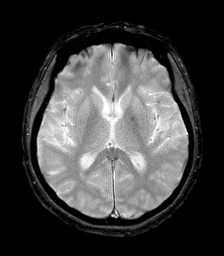
[im 25/25]
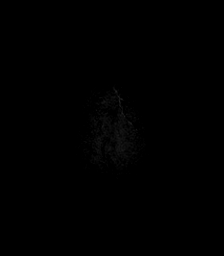

[Series 13: FLAIR · axial · 4.0mm · 0.43mm/px · z∈[-70,+53]mm · 4 of 32 slices shown]
[im 1/32]
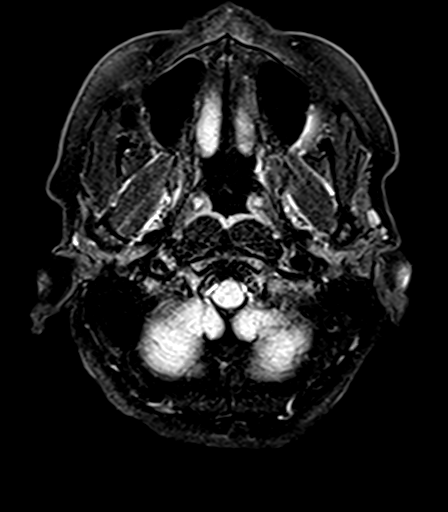
[im 11/32]
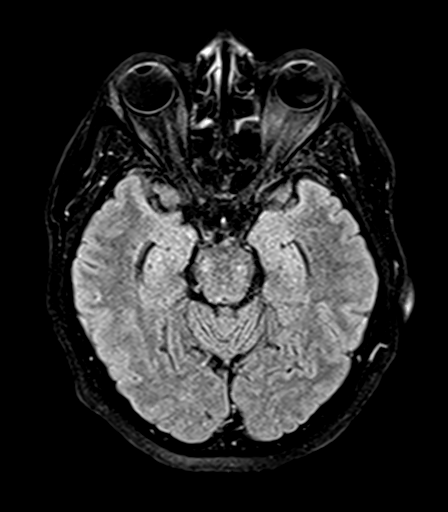
[im 21/32]
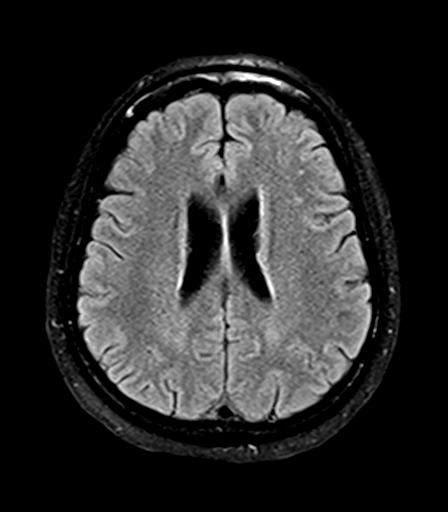
[im 32/32]
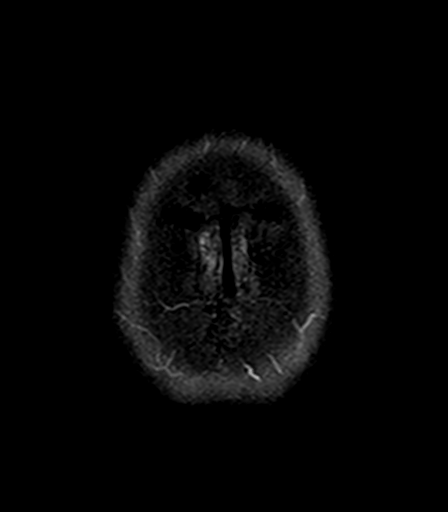

[Series 15: T2 · coronal · 5.0mm · 0.72mm/px · 3 of 28 slices shown (3 of 3)]
[im 1/28]
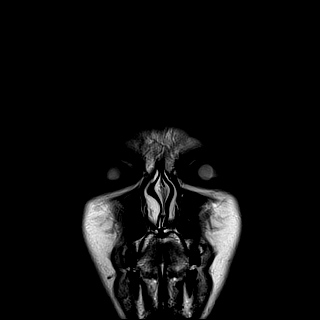
[im 14/28]
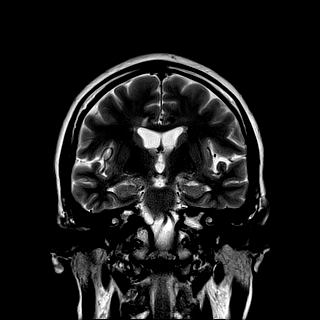
[im 28/28]
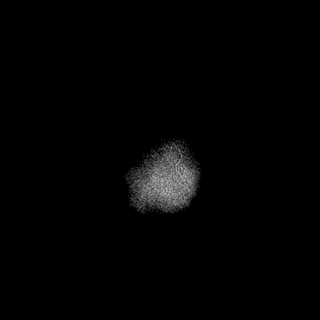

[41 of 48 positions shown; findings below may reference images not displayed]

FINDINGS: Brain: Acute infarct of the left cerebral peduncle of the midbrain.
Mild associated edema without mass effect. No evidence of acute
hemorrhage, hydrocephalus, midline shift encephalomalacia in the
right eccentric body of the corpus callosum, most likely from prior
infarct. Additional mild scattered supratentorial T2 white matter
hyperintensities and moderate pontine white matter T2
hyperintensity, nonspecific but most likely related to chronic
microvascular ischemic disease given patient risk factors (including
diabetes and hypertension). No acute hemorrhage, hydrocephalus, mass
lesion, midline shift or extra-axial fluid collection.

Vascular: Major arterial flow voids are maintained at the skull
base.

Skull and upper cervical spine: No focal marrow replacing lesion.
Mild T1 hypointensity of the cervical marrow, nonspecific but most
likely related to the patient's anemia.

Sinuses/Orbits: Mild paranasal sinus mucosal thickening. No acute
orbital findings.

Other: Mastoid effusions.
IMPRESSION: 1. Acute infarct of the left cerebral peduncle of the midbrain. Mild
associated edema without mass effect.
2. Probable remote infarct in the corpus callosum and chronic
microvascular ischemic disease. Sequela of prior demyelination is
thought less likely.

## 2022-10-02 ENCOUNTER — Telehealth: Payer: Self-pay | Admitting: Internal Medicine

## 2022-10-02 NOTE — Telephone Encounter (Signed)
Christy with [employer] states that she is not able to reach patient to discuss chronic care management of patient's ongoing health issues. Wanted [place] to be aware that she is trying to reach out to patient. Call back number is [phone] ext [phone]

## 2022-10-03 DIAGNOSIS — E113512 Type 2 diabetes mellitus with proliferative diabetic retinopathy with macular edema, left eye: Secondary | ICD-10-CM | POA: Diagnosis not present

## 2022-10-10 DIAGNOSIS — E113511 Type 2 diabetes mellitus with proliferative diabetic retinopathy with macular edema, right eye: Secondary | ICD-10-CM | POA: Diagnosis not present

## 2022-10-24 DIAGNOSIS — E113511 Type 2 diabetes mellitus with proliferative diabetic retinopathy with macular edema, right eye: Secondary | ICD-10-CM | POA: Diagnosis not present

## 2022-11-06 ENCOUNTER — Other Ambulatory Visit: Payer: Self-pay | Admitting: Internal Medicine

## 2022-11-21 DIAGNOSIS — E113512 Type 2 diabetes mellitus with proliferative diabetic retinopathy with macular edema, left eye: Secondary | ICD-10-CM | POA: Diagnosis not present

## 2022-12-05 DIAGNOSIS — E113511 Type 2 diabetes mellitus with proliferative diabetic retinopathy with macular edema, right eye: Secondary | ICD-10-CM | POA: Diagnosis not present

## 2023-01-16 DIAGNOSIS — E113511 Type 2 diabetes mellitus with proliferative diabetic retinopathy with macular edema, right eye: Secondary | ICD-10-CM | POA: Diagnosis not present

## 2023-02-04 DIAGNOSIS — E113512 Type 2 diabetes mellitus with proliferative diabetic retinopathy with macular edema, left eye: Secondary | ICD-10-CM | POA: Diagnosis not present

## 2023-02-05 DIAGNOSIS — H524 Presbyopia: Secondary | ICD-10-CM | POA: Diagnosis not present

## 2023-02-05 DIAGNOSIS — H4089 Other specified glaucoma: Secondary | ICD-10-CM | POA: Diagnosis not present

## 2023-02-05 DIAGNOSIS — H25011 Cortical age-related cataract, right eye: Secondary | ICD-10-CM | POA: Diagnosis not present

## 2023-02-05 DIAGNOSIS — H2511 Age-related nuclear cataract, right eye: Secondary | ICD-10-CM | POA: Diagnosis not present

## 2023-02-05 DIAGNOSIS — H52201 Unspecified astigmatism, right eye: Secondary | ICD-10-CM | POA: Diagnosis not present

## 2023-02-05 DIAGNOSIS — H5211 Myopia, right eye: Secondary | ICD-10-CM | POA: Diagnosis not present

## 2023-02-05 DIAGNOSIS — E113593 Type 2 diabetes mellitus with proliferative diabetic retinopathy without macular edema, bilateral: Secondary | ICD-10-CM | POA: Diagnosis not present

## 2023-02-14 ENCOUNTER — Encounter: Payer: Self-pay | Admitting: Internal Medicine

## 2023-02-14 ENCOUNTER — Ambulatory Visit (INDEPENDENT_AMBULATORY_CARE_PROVIDER_SITE_OTHER): Payer: BC Managed Care – PPO | Admitting: Internal Medicine

## 2023-02-14 VITALS — BP 160/120 | HR 74 | Temp 98.6°F | Ht 66.0 in | Wt 149.0 lb

## 2023-02-14 DIAGNOSIS — N184 Chronic kidney disease, stage 4 (severe): Secondary | ICD-10-CM | POA: Diagnosis not present

## 2023-02-14 DIAGNOSIS — E785 Hyperlipidemia, unspecified: Secondary | ICD-10-CM | POA: Diagnosis not present

## 2023-02-14 DIAGNOSIS — E1169 Type 2 diabetes mellitus with other specified complication: Secondary | ICD-10-CM

## 2023-02-14 DIAGNOSIS — H402220 Chronic angle-closure glaucoma, left eye, stage unspecified: Secondary | ICD-10-CM

## 2023-02-14 DIAGNOSIS — D638 Anemia in other chronic diseases classified elsewhere: Secondary | ICD-10-CM | POA: Diagnosis not present

## 2023-02-14 DIAGNOSIS — I10 Essential (primary) hypertension: Secondary | ICD-10-CM

## 2023-02-14 DIAGNOSIS — E118 Type 2 diabetes mellitus with unspecified complications: Secondary | ICD-10-CM

## 2023-02-14 LAB — LIPID PANEL
Cholesterol: 291 mg/dL — ABNORMAL HIGH (ref 0–200)
HDL: 43.8 mg/dL (ref >39.00)
LDL Cholesterol: 218 mg/dL — ABNORMAL HIGH (ref 0–99)
NonHDL: 247.32
Total CHOL/HDL Ratio: 7
Triglycerides: 145 mg/dL (ref 0.0–149.0)
VLDL: 29 mg/dL (ref 0.0–40.0)

## 2023-02-14 LAB — RENAL FUNCTION PANEL
Albumin: 3.3 g/dL — ABNORMAL LOW (ref 3.5–5.2)
BUN: 31 mg/dL — ABNORMAL HIGH (ref 6–23)
CO2: 24 mEq/L (ref 19–32)
Calcium: 9.2 mg/dL (ref 8.4–10.5)
Chloride: 111 mEq/L (ref 96–112)
Creatinine, Ser: 2.56 mg/dL — ABNORMAL HIGH (ref 0.40–1.20)
GFR: 21.12 mL/min — ABNORMAL LOW (ref >60.00)
Glucose, Bld: 182 mg/dL — ABNORMAL HIGH (ref 70–99)
Phosphorus: 3.7 mg/dL (ref 2.3–4.6)
Potassium: 5.5 mEq/L — ABNORMAL HIGH (ref 3.5–5.1)
Sodium: 141 mEq/L (ref 135–145)

## 2023-02-14 LAB — MICROALBUMIN / CREATININE URINE RATIO
Creatinine,U: 349.2 mg/dL
Microalb Creat Ratio: 111.2 mg/g — ABNORMAL HIGH (ref 0.0–30.0)
Microalb, Ur: 388.3 mg/dL — ABNORMAL HIGH (ref 0.0–1.9)

## 2023-02-14 LAB — HEMOGLOBIN A1C: Hgb A1c MFr Bld: 7.2 % — ABNORMAL HIGH (ref 4.6–6.5)

## 2023-02-14 LAB — CBC
HCT: 35.3 % — ABNORMAL LOW (ref 36.0–46.0)
Hemoglobin: 11.6 g/dL — ABNORMAL LOW (ref 12.0–15.0)
MCHC: 32.8 g/dL (ref 30.0–36.0)
MCV: 87.3 fl (ref 78.0–100.0)
Platelets: 386 10*3/uL (ref 150.0–400.0)
RBC: 4.04 Mil/uL (ref 3.87–5.11)
RDW: 14.5 % (ref 11.5–15.5)
WBC: 6.3 10*3/uL (ref 4.0–10.5)

## 2023-02-14 NOTE — Progress Notes (Signed)
   Subjective:   Patient ID: Lori Fleming, female    DOB: 1971/08/13, 52 y.o.   MRN: [id number]  HPI The patient is a 52 YO female coming in for headaches and vision problems. Saw eye specialist over the weekend and hers on Monday and there is more bleeding and edema left eye. Injection done in the eye over the weekend and that pain is improving slightly.   Review of Systems  Constitutional: Negative.   HENT: Negative.    Eyes:  Positive for visual disturbance.  Respiratory:  Negative for cough, chest tightness and shortness of breath.   Cardiovascular:  Negative for chest pain, palpitations and leg swelling.  Gastrointestinal:  Negative for abdominal distention, abdominal pain, constipation, diarrhea, nausea and vomiting.  Musculoskeletal: Negative.   Skin: Negative.   Neurological:  Positive for headaches.  Psychiatric/Behavioral: Negative.      Objective:  Physical Exam Constitutional:      Appearance: She is well-developed.  HENT:     Head: Normocephalic and atraumatic.  Cardiovascular:     Rate and Rhythm: Normal rate and regular rhythm.  Pulmonary:     Effort: Pulmonary effort is normal. No respiratory distress.     Breath sounds: Normal breath sounds. No wheezing or rales.  Abdominal:     General: Bowel sounds are normal. There is no distension.     Palpations: Abdomen is soft.     Tenderness: There is no abdominal tenderness. There is no rebound.  Musculoskeletal:        General: Tenderness present.     Cervical back: Normal range of motion.  Skin:    General: Skin is warm and dry.  Neurological:     Mental Status: She is alert and oriented to person, place, and time.     Coordination: Coordination normal.     Vitals:   02/14/23 0805 02/14/23 0807  BP: (!) 160/120 (!) 160/120  Pulse: 74   Temp: 98.6 F (37 C)   TempSrc: Oral   SpO2: 96%   Weight: 149 lb (67.6 kg)   Height: 5\' 6"  (1.676 m)     Assessment & Plan:

## 2023-02-14 NOTE — Patient Instructions (Signed)
We will check the labs today and make sure to take the medicines consistently.

## 2023-02-15 ENCOUNTER — Other Ambulatory Visit: Payer: Self-pay | Admitting: Internal Medicine

## 2023-02-15 ENCOUNTER — Encounter: Payer: Self-pay | Admitting: Internal Medicine

## 2023-02-15 DIAGNOSIS — N184 Chronic kidney disease, stage 4 (severe): Secondary | ICD-10-CM

## 2023-02-15 MED ORDER — ROSUVASTATIN CALCIUM 10 MG PO TABS: 10.0000 mg | ORAL_TABLET | Freq: Every day | ORAL | 3 refills | Status: DC

## 2023-02-15 NOTE — Assessment & Plan Note (Signed)
BP severely controlled and she does have difficulty taking second dose of hydralazine. She is taking atenolol 25 mg daily and hydralazine 25 mg daily most days sometimes BID. She has had better compliance in the past. Asked her if we could switch to a daily medication for better consistency and she declines. BP at goal on regimen in past. Asked her to think of ways to remember and offered her setting alarm on phone to go off BID so she will remember medications. Advised that the more she misses medications the more likely she is to progress to kidney failure and complete blindness in the eye.

## 2023-02-15 NOTE — Assessment & Plan Note (Signed)
Checking CBC for stability. Adjust as needed.

## 2023-02-15 NOTE — Assessment & Plan Note (Signed)
Checking lipid panel and not filling crestor 10 mg daily. Refilled and adjust as needed.

## 2023-02-15 NOTE — Assessment & Plan Note (Signed)
Checking CMP and she has uncontrolled BP today. Adjust as needed.

## 2023-02-15 NOTE — Assessment & Plan Note (Signed)
With worsening headaches and visit revealed new/worsening bleeding and edema in the eye. They did injection in the eye over the weekend. Talked about blood pressure control as a way to help reduce risk of full blindness in the eye.

## 2023-02-15 NOTE — Assessment & Plan Note (Signed)
Checking HgA1c and previously at goal. She is taking januvia 50 mg daily and options limited by ckd stage 4. Not on ACE-I/ARB due to CKD and prescribed statin but not taking.

## 2023-02-27 DIAGNOSIS — E113511 Type 2 diabetes mellitus with proliferative diabetic retinopathy with macular edema, right eye: Secondary | ICD-10-CM | POA: Diagnosis not present

## 2023-02-28 ENCOUNTER — Ambulatory Visit: Payer: BC Managed Care – PPO

## 2023-02-28 VITALS — BP 148/82

## 2023-02-28 DIAGNOSIS — I10 Essential (primary) hypertension: Secondary | ICD-10-CM

## 2023-02-28 NOTE — Progress Notes (Signed)
Pt BP was rechecked today and was 148/82.

## 2023-03-06 DIAGNOSIS — H40052 Ocular hypertension, left eye: Secondary | ICD-10-CM | POA: Diagnosis not present

## 2023-03-06 DIAGNOSIS — E113511 Type 2 diabetes mellitus with proliferative diabetic retinopathy with macular edema, right eye: Secondary | ICD-10-CM | POA: Diagnosis not present

## 2023-03-06 DIAGNOSIS — H3582 Retinal ischemia: Secondary | ICD-10-CM | POA: Diagnosis not present

## 2023-03-06 DIAGNOSIS — H31091 Other chorioretinal scars, right eye: Secondary | ICD-10-CM | POA: Diagnosis not present

## 2023-03-06 DIAGNOSIS — H25811 Combined forms of age-related cataract, right eye: Secondary | ICD-10-CM | POA: Diagnosis not present

## 2023-04-17 DIAGNOSIS — E113511 Type 2 diabetes mellitus with proliferative diabetic retinopathy with macular edema, right eye: Secondary | ICD-10-CM | POA: Diagnosis not present

## 2023-05-09 ENCOUNTER — Other Ambulatory Visit: Payer: Self-pay | Admitting: Internal Medicine

## 2023-06-05 DIAGNOSIS — E113511 Type 2 diabetes mellitus with proliferative diabetic retinopathy with macular edema, right eye: Secondary | ICD-10-CM | POA: Diagnosis not present

## 2023-06-28 ENCOUNTER — Other Ambulatory Visit: Payer: Self-pay | Admitting: Internal Medicine

## 2023-06-28 DIAGNOSIS — Z1212 Encounter for screening for malignant neoplasm of rectum: Secondary | ICD-10-CM

## 2023-06-28 DIAGNOSIS — Z1211 Encounter for screening for malignant neoplasm of colon: Secondary | ICD-10-CM

## 2023-07-17 ENCOUNTER — Other Ambulatory Visit: Payer: Self-pay | Admitting: Internal Medicine

## 2023-07-31 DIAGNOSIS — E113511 Type 2 diabetes mellitus with proliferative diabetic retinopathy with macular edema, right eye: Secondary | ICD-10-CM | POA: Diagnosis not present

## 2023-08-06 DIAGNOSIS — H2511 Age-related nuclear cataract, right eye: Secondary | ICD-10-CM | POA: Diagnosis not present

## 2023-08-06 DIAGNOSIS — H25011 Cortical age-related cataract, right eye: Secondary | ICD-10-CM | POA: Diagnosis not present

## 2023-08-06 DIAGNOSIS — E113591 Type 2 diabetes mellitus with proliferative diabetic retinopathy without macular edema, right eye: Secondary | ICD-10-CM | POA: Diagnosis not present

## 2023-09-04 DIAGNOSIS — H3582 Retinal ischemia: Secondary | ICD-10-CM | POA: Diagnosis not present

## 2023-09-04 DIAGNOSIS — H4089 Other specified glaucoma: Secondary | ICD-10-CM | POA: Diagnosis not present

## 2023-09-04 DIAGNOSIS — H40052 Ocular hypertension, left eye: Secondary | ICD-10-CM | POA: Diagnosis not present

## 2023-09-04 DIAGNOSIS — E113511 Type 2 diabetes mellitus with proliferative diabetic retinopathy with macular edema, right eye: Secondary | ICD-10-CM | POA: Diagnosis not present

## 2023-09-11 DIAGNOSIS — H4089 Other specified glaucoma: Secondary | ICD-10-CM | POA: Diagnosis not present

## 2023-10-02 DIAGNOSIS — E113511 Type 2 diabetes mellitus with proliferative diabetic retinopathy with macular edema, right eye: Secondary | ICD-10-CM | POA: Diagnosis not present

## 2023-11-27 DIAGNOSIS — E113511 Type 2 diabetes mellitus with proliferative diabetic retinopathy with macular edema, right eye: Secondary | ICD-10-CM | POA: Diagnosis not present

## 2023-12-23 ENCOUNTER — Telehealth: Payer: Self-pay

## 2023-12-23 ENCOUNTER — Ambulatory Visit (INDEPENDENT_AMBULATORY_CARE_PROVIDER_SITE_OTHER): Admitting: Internal Medicine

## 2023-12-23 ENCOUNTER — Encounter: Payer: Self-pay | Admitting: Internal Medicine

## 2023-12-23 VITALS — BP 132/82 | HR 95 | Temp 98.7°F | Ht 66.0 in | Wt 164.0 lb

## 2023-12-23 DIAGNOSIS — E118 Type 2 diabetes mellitus with unspecified complications: Secondary | ICD-10-CM

## 2023-12-23 DIAGNOSIS — N184 Chronic kidney disease, stage 4 (severe): Secondary | ICD-10-CM

## 2023-12-23 DIAGNOSIS — E785 Hyperlipidemia, unspecified: Secondary | ICD-10-CM | POA: Diagnosis not present

## 2023-12-23 DIAGNOSIS — Z0000 Encounter for general adult medical examination without abnormal findings: Secondary | ICD-10-CM | POA: Diagnosis not present

## 2023-12-23 DIAGNOSIS — E1169 Type 2 diabetes mellitus with other specified complication: Secondary | ICD-10-CM

## 2023-12-23 DIAGNOSIS — I10 Essential (primary) hypertension: Secondary | ICD-10-CM

## 2023-12-23 LAB — LIPID PANEL
Cholesterol: 277 mg/dL — ABNORMAL HIGH (ref 0–200)
HDL: 66.5 mg/dL (ref >39.00)
LDL Cholesterol: 185 mg/dL — ABNORMAL HIGH (ref 0–99)
NonHDL: 210.52
Total CHOL/HDL Ratio: 4
Triglycerides: 128 mg/dL (ref 0.0–149.0)
VLDL: 25.6 mg/dL (ref 0.0–40.0)

## 2023-12-23 LAB — MICROALBUMIN / CREATININE URINE RATIO
Creatinine,U: 180.3 mg/dL
Microalb Creat Ratio: 739.2 mg/g — ABNORMAL HIGH (ref 0.0–30.0)
Microalb, Ur: 133.3 mg/dL — ABNORMAL HIGH (ref 0.0–1.9)

## 2023-12-23 LAB — HEPATIC FUNCTION PANEL
ALT: 8 U/L (ref 0–35)
AST: 9 U/L (ref 0–37)
Albumin: 3.1 g/dL — ABNORMAL LOW (ref 3.5–5.2)
Alkaline Phosphatase: 58 U/L (ref 39–117)
Bilirubin, Direct: 0.1 mg/dL (ref 0.0–0.3)
Total Bilirubin: 0.3 mg/dL (ref 0.2–1.2)
Total Protein: 6.1 g/dL (ref 6.0–8.3)

## 2023-12-23 LAB — RENAL FUNCTION PANEL
Albumin: 3.1 g/dL — ABNORMAL LOW (ref 3.5–5.2)
BUN: 26 mg/dL — ABNORMAL HIGH (ref 6–23)
CO2: 23 meq/L (ref 19–32)
Calcium: 8.4 mg/dL (ref 8.4–10.5)
Chloride: 108 meq/L (ref 96–112)
Creatinine, Ser: 3.5 mg/dL — ABNORMAL HIGH (ref 0.40–1.20)
GFR: 14.43 mL/min — CL (ref >60.00)
Glucose, Bld: 156 mg/dL — ABNORMAL HIGH (ref 70–99)
Phosphorus: 3.6 mg/dL (ref 2.3–4.6)
Potassium: 4.5 meq/L (ref 3.5–5.1)
Sodium: 138 meq/L (ref 135–145)

## 2023-12-23 LAB — CBC
HCT: 34.1 % — ABNORMAL LOW (ref 36.0–46.0)
Hemoglobin: 11.1 g/dL — ABNORMAL LOW (ref 12.0–15.0)
MCHC: 32.7 g/dL (ref 30.0–36.0)
MCV: 88 fl (ref 78.0–100.0)
Platelets: 428 10*3/uL — ABNORMAL HIGH (ref 150.0–400.0)
RBC: 3.87 Mil/uL (ref 3.87–5.11)
RDW: 14.4 % (ref 11.5–15.5)
WBC: 6.6 10*3/uL (ref 4.0–10.5)

## 2023-12-23 LAB — HEMOGLOBIN A1C: Hgb A1c MFr Bld: 7.6 % — ABNORMAL HIGH (ref 4.6–6.5)

## 2023-12-23 MED ORDER — HYDRALAZINE HCL 25 MG PO TABS: 25.0000 mg | ORAL_TABLET | Freq: Two times a day (BID) | ORAL | 3 refills | Status: DC

## 2023-12-23 NOTE — Progress Notes (Unsigned)
   Subjective:   Patient ID: Lori Fleming, female    DOB: [birth year], 53 y.o.   MRN: [id number]  HPI The patient is a 53 YO female coming in for concerns about hiccups. 3-4 episodes per day last few weeks. Last lasts 2024 with worsening renal function. She did not go to nephrology and did not return for follow up labs. She is peeing often. Still tired and about the same as before.  Also desires physical.  PMH, [facility], social history reviewed and updated.  Review of Systems  Constitutional: Negative.   HENT: Negative.    Eyes: Negative.   Respiratory:  Negative for cough, chest tightness and shortness of breath.        Hiccups  Cardiovascular:  Negative for chest pain, palpitations and leg swelling.  Gastrointestinal:  Negative for abdominal distention, abdominal pain, constipation, diarrhea, nausea and vomiting.  Musculoskeletal: Negative.   Skin: Negative.   Neurological: Negative.   Psychiatric/Behavioral: Negative.      Objective:  Physical Exam Constitutional:      Appearance: She is well-developed.  HENT:     Head: Normocephalic and atraumatic.  Cardiovascular:     Rate and Rhythm: Normal rate and regular rhythm.  Pulmonary:     Effort: Pulmonary effort is normal. No respiratory distress.     Breath sounds: Normal breath sounds. No wheezing or rales.  Abdominal:     General: Bowel sounds are normal. There is no distension.     Palpations: Abdomen is soft.     Tenderness: There is no abdominal tenderness. There is no rebound.  Musculoskeletal:     Cervical back: Normal range of motion.  Skin:    General: Skin is warm and dry.  Neurological:     Mental Status: She is alert and oriented to person, place, and time.     Sensory: Sensory deficit present.     Coordination: Coordination normal.     Comments: neuropathy     Vitals:   12/23/23 1305  BP: 132/82  Pulse: 95  Temp: 98.7 F (37.1 C)  SpO2: 96%  Weight: 164 lb (74.4 kg)  Height: 5\' 6"  (1.676 m)     Assessment & Plan:

## 2023-12-23 NOTE — Telephone Encounter (Signed)
 Noted will wait on lab finalization to review and determine appropriate action

## 2023-12-23 NOTE — Patient Instructions (Signed)
 We are checking the labs today. Your last kidney numbers are getting very close to dialysis so we need you to go to the kidney doctor.

## 2023-12-23 NOTE — Telephone Encounter (Signed)
 CRITICAL VALUE STICKER  CRITICAL VALUE: GFR 14.43  RECEIVER (on-site recipient of call): Lori Fleming  DATE & TIME NOTIFIED: 12/23/2023 at [facility] (representative from lab): Hope  MD NOTIFIED: Dr.Elizabeth Okey Dupre  TIME OF NOTIFICATION: 03:31  RESPONSE:  Will notify patient of results

## 2023-12-24 ENCOUNTER — Encounter: Payer: Self-pay | Admitting: Internal Medicine

## 2023-12-24 NOTE — Assessment & Plan Note (Signed)
 Flu shot declines. Pneumonia declines. Shingrix declines. Tetanus declines. Colonoscopy not up to date declines. Mammogram declines, pap smear declines. Counseled about sun safety and mole surveillance. Counseled about the dangers of distracted driving. Given 10 year screening recommendations.

## 2023-12-24 NOTE — Assessment & Plan Note (Signed)
 Checking HGA1c and has not been taking her [place] for some time. Checking microalbumin to creatinine ratio and GFR and lipid panel. She is not on ACE/ARB due to lack of renal stability last year then lack of follow up. I suspect worsening renal function currently so would likely not be appropriate to start. Depending on GFR we can consider jardiance.

## 2023-12-24 NOTE — Assessment & Plan Note (Signed)
 She did stop taking crestor and does not want to resume. She understands she is at high risk of CV disease.

## 2023-12-24 NOTE — Assessment & Plan Note (Signed)
 BP at goal on hydralazine 25 mg BID alone. She stopped atenolol without advice. Will continue for now. She was previously on ACE/ARB but due to AKI stopped and renal function was not stable long enough to resume then she did not follow up. Suspect worsening and checking renal function today.

## 2023-12-24 NOTE — Assessment & Plan Note (Signed)
 I am concerned that her hiccups represent progression of renal disease. She is still making good production of urine and not volume overloaded on exam. She has had declining renal function. She has stopped taking 1 BP med and diabetes medication without letting us know. We discussed that she is getting very close to dialysis range. She needs to see nephrology referral placed again today. Checking renal function panel today.

## 2024-01-22 DIAGNOSIS — E113511 Type 2 diabetes mellitus with proliferative diabetic retinopathy with macular edema, right eye: Secondary | ICD-10-CM | POA: Diagnosis not present

## 2024-01-30 ENCOUNTER — Ambulatory Visit: Payer: Self-pay

## 2024-01-30 NOTE — Telephone Encounter (Signed)
 Patient calling to request renal panel. Per chart review, most recent labs drawn 03/31. Dialysis was suggested. Please advise to patient on renal panel and lab scheduling.   Copied from CRM [id number]. Topic: Clinical - Request for Lab/Test Order >> Jan 30, 2024 10:16 AM Keitha Pata L wrote: Reason for CRM: patient is requesting a for a renal panel to be done

## 2024-01-31 NOTE — Telephone Encounter (Signed)
 Good morning!  Okay for renal panel for patient?

## 2024-01-31 NOTE — Telephone Encounter (Signed)
 Has she seen nephrology? This was our recommendation that she follow through with scheduling with them. If not let me know

## 2024-02-03 ENCOUNTER — Other Ambulatory Visit (INDEPENDENT_AMBULATORY_CARE_PROVIDER_SITE_OTHER)

## 2024-02-03 ENCOUNTER — Telehealth: Payer: Self-pay | Admitting: Internal Medicine

## 2024-02-03 ENCOUNTER — Other Ambulatory Visit

## 2024-02-03 ENCOUNTER — Ambulatory Visit: Admitting: Internal Medicine

## 2024-02-03 ENCOUNTER — Telehealth: Payer: Self-pay

## 2024-02-03 ENCOUNTER — Encounter: Payer: Self-pay | Admitting: Internal Medicine

## 2024-02-03 ENCOUNTER — Other Ambulatory Visit: Payer: Self-pay | Admitting: Internal Medicine

## 2024-02-03 DIAGNOSIS — N185 Chronic kidney disease, stage 5: Secondary | ICD-10-CM | POA: Diagnosis not present

## 2024-02-03 LAB — RENAL FUNCTION PANEL
Albumin: 3.4 g/dL — ABNORMAL LOW (ref 3.5–5.2)
BUN: 23 mg/dL (ref 6–23)
CO2: 21 meq/L (ref 19–32)
Calcium: 9.1 mg/dL (ref 8.4–10.5)
Chloride: 105 meq/L (ref 96–112)
Creatinine, Ser: 4.06 mg/dL — ABNORMAL HIGH (ref 0.40–1.20)
GFR: 12.06 mL/min — CL (ref >60.00)
Glucose, Bld: 96 mg/dL (ref 70–99)
Phosphorus: 2.9 mg/dL (ref 2.3–4.6)
Potassium: 4.3 meq/L (ref 3.5–5.1)
Sodium: 138 meq/L (ref 135–145)

## 2024-02-03 NOTE — Telephone Encounter (Signed)
 CRITICAL VALUE STICKER  CRITICAL VALUE: GFR 12.06  RECEIVER (on-site recipient of call): Marcella Dunnaway Stubbs-Barnette  DATE & TIME NOTIFIED: 02/03/24 at 10:42 am  MESSENGER (representative from lab): Hope  MD NOTIFIED: Dr.Elizabeth Crawford  TIME OF NOTIFICATION: 10:42  RESPONSE:  Stable for patient

## 2024-02-03 NOTE — Telephone Encounter (Signed)
 This referral to nephrology needs escalated please call [facility]. She is now CKD stage 5 (change from referral stage 4) and needs to be seen there. Please help with this.

## 2024-02-03 NOTE — Telephone Encounter (Signed)
 Pt came in today for labs. When asked about her nephrology appointment she stated that when she called [facility] wouldn't put her on the schedule b/c it was so full, and that they would call her when there is availability.  Orders were placed and pt had labs done.   (Unable to attach to triage note)

## 2024-02-03 NOTE — Telephone Encounter (Signed)
 See result note no urgent action needed.

## 2024-02-05 NOTE — Telephone Encounter (Signed)
 Called and informed patient and lvm 1 st attempt

## 2024-02-06 NOTE — Telephone Encounter (Signed)
 Copied from CRM [id number]. Topic: General - Other >> Feb 05, 2024  4:25 PM Taleah C wrote: Reason for CRM: patient called back to return Micaiah's call regarding the encounter on 05/08, NT. She stated that she has not scheduled with Nephrology because she called them last week and they told her she has to wait for them to contact her. Please call back and advise.

## 2024-02-07 NOTE — Telephone Encounter (Signed)
 Can you help me follow up with referrals? This needs to be expedited and I have heard no response from the pool?

## 2024-02-10 NOTE — Telephone Encounter (Signed)
 Did you confirm with [facility] that this is no longer ckd 4 it is ckd 5 and needs to be expedited urgently?

## 2024-02-12 NOTE — Telephone Encounter (Signed)
 The lab results will help show trend in gfr over last year

## 2024-02-13 NOTE — Telephone Encounter (Signed)
 Patient updated labs sent with copy of referral to [facility].  [phone] Sent via EPIC and traditional fax

## 2024-02-25 DIAGNOSIS — N185 Chronic kidney disease, stage 5: Secondary | ICD-10-CM | POA: Diagnosis not present

## 2024-02-25 DIAGNOSIS — I120 Hypertensive chronic kidney disease with stage 5 chronic kidney disease or end stage renal disease: Secondary | ICD-10-CM | POA: Diagnosis not present

## 2024-02-25 DIAGNOSIS — D509 Iron deficiency anemia, unspecified: Secondary | ICD-10-CM | POA: Diagnosis not present

## 2024-02-25 DIAGNOSIS — E785 Hyperlipidemia, unspecified: Secondary | ICD-10-CM | POA: Diagnosis not present

## 2024-03-17 DIAGNOSIS — I129 Hypertensive chronic kidney disease with stage 1 through stage 4 chronic kidney disease, or unspecified chronic kidney disease: Secondary | ICD-10-CM | POA: Diagnosis not present

## 2024-03-18 DIAGNOSIS — H20012 Primary iridocyclitis, left eye: Secondary | ICD-10-CM | POA: Diagnosis not present

## 2024-03-18 DIAGNOSIS — E113511 Type 2 diabetes mellitus with proliferative diabetic retinopathy with macular edema, right eye: Secondary | ICD-10-CM | POA: Diagnosis not present

## 2024-03-31 ENCOUNTER — Other Ambulatory Visit: Payer: Self-pay | Admitting: Internal Medicine

## 2024-03-31 DIAGNOSIS — Z1231 Encounter for screening mammogram for malignant neoplasm of breast: Secondary | ICD-10-CM

## 2024-04-08 DIAGNOSIS — E1122 Type 2 diabetes mellitus with diabetic chronic kidney disease: Secondary | ICD-10-CM | POA: Diagnosis not present

## 2024-04-08 DIAGNOSIS — I120 Hypertensive chronic kidney disease with stage 5 chronic kidney disease or end stage renal disease: Secondary | ICD-10-CM | POA: Diagnosis not present

## 2024-04-08 DIAGNOSIS — D509 Iron deficiency anemia, unspecified: Secondary | ICD-10-CM | POA: Diagnosis not present

## 2024-04-08 DIAGNOSIS — N185 Chronic kidney disease, stage 5: Secondary | ICD-10-CM | POA: Diagnosis not present

## 2024-04-09 ENCOUNTER — Telehealth (HOSPITAL_COMMUNITY): Payer: Self-pay | Admitting: Pharmacy Technician

## 2024-04-09 NOTE — Telephone Encounter (Signed)
 Auth Submission: PENDING Site of care: Site of care: [facility] Payer: [employer] Medication & CPT/J Code(s) submitted: Feraheme (ferumoxytol) [id number] Diagnosis Code: N18.4, D63.1 Route of submission (phone, fax, portal): phone Phone # [facility] [phone] (Opt 2, then 1)  Fax # Auth type: Buy/Bill HB Units/visits requested: 510mg  x 1 dose Reference number: [id number] Approval from:   Determination will be faxed within 5 business days.  Lori Fleming, CPhT [facility] Phone: [phone] 04/09/2024

## 2024-04-17 ENCOUNTER — Other Ambulatory Visit (HOSPITAL_COMMUNITY): Payer: Self-pay | Admitting: *Deleted

## 2024-04-17 ENCOUNTER — Other Ambulatory Visit: Payer: Self-pay | Admitting: Surgery

## 2024-04-17 DIAGNOSIS — N184 Chronic kidney disease, stage 4 (severe): Secondary | ICD-10-CM

## 2024-04-20 DIAGNOSIS — E113511 Type 2 diabetes mellitus with proliferative diabetic retinopathy with macular edema, right eye: Secondary | ICD-10-CM | POA: Diagnosis not present

## 2024-04-20 DIAGNOSIS — H44112 Panuveitis, left eye: Secondary | ICD-10-CM | POA: Diagnosis not present

## 2024-04-20 DIAGNOSIS — H3582 Retinal ischemia: Secondary | ICD-10-CM | POA: Diagnosis not present

## 2024-04-20 DIAGNOSIS — H59811 Chorioretinal scars after surgery for detachment, right eye: Secondary | ICD-10-CM | POA: Diagnosis not present

## 2024-04-21 ENCOUNTER — Ambulatory Visit (HOSPITAL_COMMUNITY)
Admission: RE | Admit: 2024-04-21 | Discharge: 2024-04-21 | Disposition: A | Discharge status: Home / Self Care | Source: Ambulatory Visit | Attending: Nephrology | Admitting: Nephrology

## 2024-04-21 DIAGNOSIS — N185 Chronic kidney disease, stage 5: Secondary | ICD-10-CM | POA: Diagnosis not present

## 2024-04-21 DIAGNOSIS — D631 Anemia in chronic kidney disease: Secondary | ICD-10-CM | POA: Diagnosis not present

## 2024-04-21 MED ORDER — SODIUM CHLORIDE 0.9 % IV SOLN
510.0000 mg | Freq: Once | INTRAVENOUS | Status: AC
  Administered 2024-04-21: 510 mg via INTRAVENOUS
  Filled 2024-04-21: qty 510

## 2024-04-24 DIAGNOSIS — I120 Hypertensive chronic kidney disease with stage 5 chronic kidney disease or end stage renal disease: Secondary | ICD-10-CM | POA: Diagnosis not present

## 2024-04-27 ENCOUNTER — Ambulatory Visit

## 2024-04-27 ENCOUNTER — Ambulatory Visit
Admission: RE | Admit: 2024-04-27 | Discharge: 2024-04-27 | Disposition: A | Discharge status: Home / Self Care | Source: Ambulatory Visit | Attending: Internal Medicine | Admitting: Internal Medicine

## 2024-04-27 ENCOUNTER — Encounter: Admitting: Surgery

## 2024-04-27 ENCOUNTER — Encounter (HOSPITAL_COMMUNITY)

## 2024-04-27 ENCOUNTER — Other Ambulatory Visit (HOSPITAL_COMMUNITY)

## 2024-04-27 DIAGNOSIS — Z1231 Encounter for screening mammogram for malignant neoplasm of breast: Secondary | ICD-10-CM

## 2024-05-01 ENCOUNTER — Other Ambulatory Visit: Payer: Self-pay | Admitting: Internal Medicine

## 2024-05-01 DIAGNOSIS — R928 Other abnormal and inconclusive findings on diagnostic imaging of breast: Secondary | ICD-10-CM

## 2024-05-05 DIAGNOSIS — H35052 Retinal neovascularization, unspecified, left eye: Secondary | ICD-10-CM | POA: Diagnosis not present

## 2024-05-05 DIAGNOSIS — H44112 Panuveitis, left eye: Secondary | ICD-10-CM | POA: Diagnosis not present

## 2024-05-05 DIAGNOSIS — E113511 Type 2 diabetes mellitus with proliferative diabetic retinopathy with macular edema, right eye: Secondary | ICD-10-CM | POA: Diagnosis not present

## 2024-05-05 DIAGNOSIS — H2102 Hyphema, left eye: Secondary | ICD-10-CM | POA: Diagnosis not present

## 2024-05-06 DIAGNOSIS — H4052X1 Glaucoma secondary to other eye disorders, left eye, mild stage: Secondary | ICD-10-CM | POA: Diagnosis not present

## 2024-05-06 DIAGNOSIS — E113513 Type 2 diabetes mellitus with proliferative diabetic retinopathy with macular edema, bilateral: Secondary | ICD-10-CM | POA: Diagnosis not present

## 2024-05-06 DIAGNOSIS — H2511 Age-related nuclear cataract, right eye: Secondary | ICD-10-CM | POA: Diagnosis not present

## 2024-05-06 DIAGNOSIS — H25011 Cortical age-related cataract, right eye: Secondary | ICD-10-CM | POA: Diagnosis not present

## 2024-05-08 ENCOUNTER — Ambulatory Visit
Admission: RE | Admit: 2024-05-08 | Discharge: 2024-05-08 | Disposition: A | Discharge status: Home / Self Care | Source: Ambulatory Visit | Attending: Internal Medicine | Admitting: Internal Medicine

## 2024-05-08 DIAGNOSIS — R928 Other abnormal and inconclusive findings on diagnostic imaging of breast: Secondary | ICD-10-CM | POA: Diagnosis not present

## 2024-05-08 DIAGNOSIS — N6325 Unspecified lump in the left breast, overlapping quadrants: Secondary | ICD-10-CM | POA: Diagnosis not present

## 2024-05-11 ENCOUNTER — Ambulatory Visit (HOSPITAL_COMMUNITY)
Admission: RE | Admit: 2024-05-11 | Discharge: 2024-05-11 | Disposition: A | Discharge status: Home / Self Care | Source: Ambulatory Visit | Attending: Surgery | Admitting: Surgery

## 2024-05-11 ENCOUNTER — Telehealth: Payer: Self-pay

## 2024-05-11 ENCOUNTER — Encounter: Payer: Self-pay | Admitting: Surgery

## 2024-05-11 ENCOUNTER — Ambulatory Visit (INDEPENDENT_AMBULATORY_CARE_PROVIDER_SITE_OTHER): Admitting: Surgery

## 2024-05-11 ENCOUNTER — Ambulatory Visit (HOSPITAL_BASED_OUTPATIENT_CLINIC_OR_DEPARTMENT_OTHER)
Admission: RE | Admit: 2024-05-11 | Discharge: 2024-05-11 | Disposition: A | Discharge status: Home / Self Care | Source: Ambulatory Visit | Attending: Surgery | Admitting: Surgery

## 2024-05-11 VITALS — BP 177/100 | HR 79 | Temp 97.9°F | Resp 18 | Ht 66.0 in | Wt 136.4 lb

## 2024-05-11 DIAGNOSIS — N184 Chronic kidney disease, stage 4 (severe): Secondary | ICD-10-CM

## 2024-05-11 DIAGNOSIS — N185 Chronic kidney disease, stage 5: Secondary | ICD-10-CM | POA: Diagnosis not present

## 2024-05-11 NOTE — Telephone Encounter (Signed)
 Patient will call for L AVG placement with Dr. Serene after renal clearance.

## 2024-05-11 NOTE — Progress Notes (Signed)
 [facility]  Patient name: Lori Fleming MRN: [id number] DOB: 1971/02/22 Sex: female   REQUESTING PROVIDER:    Dr. Melia   REASON FOR CONSULT:    Access placement  HISTORY OF PRESENT ILLNESS:   Lori Fleming is a 53 y.o. female, who is referred for dialysis access.  The patient is renal failure secondary to diabetes and hypertension.  She is right-handed.  She does not have a pacemaker or defibrillator.  She is not on anticoagulation  PAST MEDICAL HISTORY    Past Medical History:  Diagnosis Date   Chronic kidney disease    Diabetes mellitus without complication (HCC)    Hypertension    Stroke (HCC)      FAMILY HISTORY   Family History  Problem Relation Age of Onset   Hypertension Father    Diabetes Father    Hypertension Sister    Mental illness Sister    Mental illness Brother    Alcohol abuse Paternal Grandfather    Diabetes Maternal Aunt     SOCIAL HISTORY:   Social History   Socioeconomic History   Marital status: Married    Spouse name: Not on file   Number of children: Not on file   Years of education: Not on file   Highest education level: Not on file  Occupational History   Not on file  Tobacco Use   Smoking status: Never   Smokeless tobacco: Never  Vaping Use   Vaping status: Never Used  Substance and Sexual Activity   Alcohol use: No    Alcohol/week: 0.0 standard drinks of alcohol   Drug use: No   Sexual activity: Not on file  Other Topics Concern   Not on file  Social History Narrative   Not on file   Social Drivers of Health   Financial Resource Strain: Not on file  Food Insecurity: Not on file  Transportation Needs: Not on file  Physical Activity: Not on file  Stress: Not on file  Social Connections: Not on file  Intimate Partner Violence: Not on file    ALLERGIES:    No Known Allergies  CURRENT MEDICATIONS:    Current Outpatient Medications   Medication Sig Dispense Refill   aspirin  EC 81 MG tablet Take 81 mg by mouth daily. Swallow whole.     brimonidine  (ALPHAGAN ) 0.2 % ophthalmic solution Place 1 drop into the left eye 3 (three) times daily. 5 mL 12   dorzolamide -timolol  (COSOPT ) 22.3-6.8 MG/ML ophthalmic solution Place 1 drop into the left eye 2 (two) times daily. 10 mL 3   hydrALAZINE  (APRESOLINE ) 25 MG tablet Take 1 tablet (25 mg total) by mouth in the morning and at bedtime. 180 tablet 3   losartan  (COZAAR ) 50 MG tablet Take 50 mg by mouth daily.     ONETOUCH VERIO test strip USE DAILY TO CHECK SUGAR 100 strip 12   No current facility-administered medications for this visit.    REVIEW OF SYSTEMS:   [X]  denotes positive finding, [ ]  denotes negative finding Cardiac  Comments:  Chest pain or chest pressure:    Shortness of breath upon exertion:    Short of breath when lying flat:    Irregular heart rhythm:        Vascular    Pain in calf, thigh, or hip brought on by ambulation:    Pain in feet at night that wakes you up from your sleep:     Blood clot in your veins:  Leg swelling:         Pulmonary    Oxygen at home:    Productive cough:     Wheezing:         Neurologic    Sudden weakness in arms or legs:     Sudden numbness in arms or legs:     Sudden onset of difficulty speaking or slurred speech:    Temporary loss of vision in one eye:     Problems with dizziness:         Gastrointestinal    Blood in stool:      Vomited blood:         Genitourinary    Burning when urinating:     Blood in urine:        Psychiatric    Major depression:         Hematologic    Bleeding problems:    Problems with blood clotting too easily:        Skin    Rashes or ulcers:        Constitutional    Fever or chills:     PHYSICAL EXAM:   Vitals:   05/11/24 0956  BP: (!) 177/100  Pulse: 79  Resp: 18  Temp: 97.9 F (36.6 C)  TempSrc: Temporal  SpO2: 99%  Weight: 136 lb 6.4 oz (61.9 kg)  Height: 5'  6 (1.676 m)    GENERAL: The patient is a well-nourished female, in no acute distress. The vital signs are documented above. CARDIAC: There is a regular rate and rhythm.  VASCULAR: Palpable left radial pulse PULMONARY: Nonlabored respirations MUSCULOSKELETAL: There are no major deformities or cyanosis. NEUROLOGIC: No focal weakness or paresthesias are detected. SKIN: There are no ulcers or rashes noted. PSYCHIATRIC: The patient has a normal affect.  STUDIES:   I have reviewed the following vein mapping: +-----------------+-------------+----------+--------------+  Right Cephalic   Diameter (cm)Depth (cm)   Findings     +-----------------+-------------+----------+--------------+  Prox upper arm       0.12        0.82                   +-----------------+-------------+----------+--------------+  Mid upper arm        0.09        0.62                   +-----------------+-------------+----------+--------------+  Dist upper arm       0.08        0.41                   +-----------------+-------------+----------+--------------+  Antecubital fossa    0.09        0.39     branching     +-----------------+-------------+----------+--------------+  Prox forearm         0.18        0.25                   +-----------------+-------------+----------+--------------+  Mid forearm                             not visualized  +-----------------+-------------+----------+--------------+  Dist forearm                            not visualized  +-----------------+-------------+----------+--------------+  Wrist  not visualized  +-----------------+-------------+----------+--------------+   +-----------------+-------------+----------+--------------+  Right Basilic    Diameter (cm)Depth (cm)   Findings     +-----------------+-------------+----------+--------------+  Mid upper arm        0.14        0.82                    +-----------------+-------------+----------+--------------+  Dist upper arm       0.14        1.20                   +-----------------+-------------+----------+--------------+  Antecubital fossa                       not visualized  +-----------------+-------------+----------+--------------+  Prox forearm                            not visualized  +-----------------+-------------+----------+--------------+  Mid forearm                             not visualized  +-----------------+-------------+----------+--------------+   +-----------------+-------------+----------+-----------------+  Left Cephalic    Diameter (cm)Depth (cm)    Findings       +-----------------+-------------+----------+-----------------+  Prox upper arm       0.21        0.79                      +-----------------+-------------+----------+-----------------+  Mid upper arm        0.13        0.58                      +-----------------+-------------+----------+-----------------+  Dist upper arm       0.11        0.43                      +-----------------+-------------+----------+-----------------+  Antecubital fossa    0.09        0.30       branching      +-----------------+-------------+----------+-----------------+  Prox forearm                                branching      +-----------------+-------------+----------+-----------------+  Mid forearm          0.22        0.46   perforator .10 cm  +-----------------+-------------+----------+-----------------+  Wrist               0.11        0.31                      +-----------------+-------------+----------+-----------------+   +-----------------+-------------+----------+--------------+  Left Basilic     Diameter (cm)Depth (cm)   Findings     +-----------------+-------------+----------+--------------+  Mid upper arm        0.20        1.06                    +-----------------+-------------+----------+--------------+  Dist upper arm       0.15        1.30     branching     +-----------------+-------------+----------+--------------+  Antecubital fossa  not visualized  +-----------------+-------------+----------+--------------+  Prox forearm                            not visualized  +-----------------+-------------+----------+--------------+  Mid forearm                             not visualized  ASSESSMENT and PLAN   CKD 5: The patient has very small surface veins and will need a left upper arm graft.  Dr. Flora office indicated that we should wait before placing a graft.  I discussed the details of the surgery with the patient.  We discussed the risk of steal and graft failure.  All questions were answered.  Our office will be in touch when we get clearance to proceed with surgery.   Malvina Serene CLORE, MD, FACS [facility] [phone] Pager [phone]

## 2024-05-11 NOTE — Telephone Encounter (Signed)
 STAT order from [facility] was received on 8/15 and fax back in 8/18 fax was successful

## 2024-05-12 ENCOUNTER — Encounter: Payer: Self-pay | Admitting: Internal Medicine

## 2024-05-12 ENCOUNTER — Other Ambulatory Visit (HOSPITAL_COMMUNITY): Payer: Self-pay | Admitting: Nurse Practitioner

## 2024-05-12 ENCOUNTER — Encounter (INDEPENDENT_AMBULATORY_CARE_PROVIDER_SITE_OTHER): Payer: Self-pay | Admitting: *Deleted

## 2024-05-12 ENCOUNTER — Ambulatory Visit: Payer: Self-pay

## 2024-05-12 DIAGNOSIS — Z1331 Encounter for screening for depression: Secondary | ICD-10-CM | POA: Diagnosis not present

## 2024-05-12 DIAGNOSIS — N852 Hypertrophy of uterus: Secondary | ICD-10-CM

## 2024-05-12 DIAGNOSIS — Z113 Encounter for screening for infections with a predominantly sexual mode of transmission: Secondary | ICD-10-CM | POA: Diagnosis not present

## 2024-05-12 DIAGNOSIS — Z01419 Encounter for gynecological examination (general) (routine) without abnormal findings: Secondary | ICD-10-CM | POA: Diagnosis not present

## 2024-05-12 NOTE — Telephone Encounter (Signed)
 FYI Only or Action Required?: FYI only for provider.  Patient was last seen in primary care on 12/23/2023 by Rollene Almarie LABOR, MD.  Called Nurse Triage reporting Hypertension.  Symptoms began today.  Interventions attempted: Prescription medications: hydralazine  and Rest, hydration, or home remedies.  Symptoms are: asymptomatic.  Triage Disposition: See Physician Within 24 Hours  Patient/caregiver understands and will follow disposition?:   Copied from CRM #[id number]. Topic: Clinical - Red Word Triage >> May 12, 2024 10:29 AM Porter L wrote: Red Word that prompted transfer to Nurse Triage: [facility] calling asking to speak to nurse. blood pressure is 190/100 Reason for Disposition  Systolic BP >= 180 OR Diastolic >= 110  Answer Assessment - Initial Assessment Questions 1. BLOOD PRESSURE: What is your blood pressure? Did you take at least two measurements 5 minutes apart?     190/100, 180/100,  172/86 2. ONSET: When did you take your blood pressure?     Blood pressure was checked this morning 3. HOW: How did you take your blood pressure? (e.g., automatic home BP monitor, visiting nurse)     At [facility] department 4. HISTORY: Do you have a history of high blood pressure?     yes 5. MEDICINES: Are you taking any medicines for blood pressure? Have you missed any doses recently?     Hydralazine  50 mg BID 6. OTHER SYMPTOMS: Do you have any symptoms? (e.g., blurred vision, chest pain, difficulty breathing, headache, weakness)     No symptoms.  7. PREGNANCY: Is there any chance you are pregnant? When was your last menstrual period?     No  Appointment made for patient to be evaluated tomorrow in the office at 11 AM by another provider.  Protocols used: Blood Pressure - High-A-AH

## 2024-05-12 NOTE — Patient Instructions (Incomplete)
      Medications changes include :   increase hydralazine  to three times a day.  Add amlodipine  5 mg daily.      Update  us  with your BP readings in 2 weeks

## 2024-05-12 NOTE — Progress Notes (Unsigned)
    Subjective:    Patient ID: Lori Fleming, female    DOB: [birth year], 53 y.o.   MRN: [id number]      HPI Lori Fleming is here for No chief complaint on file.   Follows with nephrology - increased hydralazine  to 25 mg from BID to TID.  Losartan  was stopped due to hyperkalemia and it caused throat fullness/cough.   Diabetes not controlled -    Medications and allergies reviewed with patient and updated if appropriate.  Current Outpatient Medications on File Prior to Visit  Medication Sig Dispense Refill   aspirin  EC 81 MG tablet Take 81 mg by mouth daily. Swallow whole.     brimonidine  (ALPHAGAN ) 0.2 % ophthalmic solution Place 1 drop into the left eye 3 (three) times daily. 5 mL 12   dorzolamide -timolol  (COSOPT ) 22.3-6.8 MG/ML ophthalmic solution Place 1 drop into the left eye 2 (two) times daily. 10 mL 3   hydrALAZINE  (APRESOLINE ) 25 MG tablet Take 1 tablet (25 mg total) by mouth in the morning and at bedtime. 180 tablet 3   losartan  (COZAAR ) 50 MG tablet Take 50 mg by mouth daily.     ONETOUCH VERIO test strip USE DAILY TO CHECK SUGAR 100 strip 12   No current facility-administered medications on file prior to visit.    Review of Systems     Objective:  There were no vitals filed for this visit. BP Readings from Last 3 Encounters:  05/11/24 (!) 177/100  04/21/24 (!) 152/82  12/23/23 132/82   Wt Readings from Last 3 Encounters:  05/11/24 136 lb 6.4 oz (61.9 kg)  12/23/23 164 lb (74.4 kg)  02/14/23 149 lb (67.6 kg)   There is no height or weight on file to calculate BMI.    Physical Exam         Assessment & Plan:    See Problem List for Assessment and Plan of chronic medical problems.

## 2024-05-13 ENCOUNTER — Emergency Department (HOSPITAL_COMMUNITY)

## 2024-05-13 ENCOUNTER — Ambulatory Visit: Admitting: Internal Medicine

## 2024-05-13 ENCOUNTER — Emergency Department (HOSPITAL_COMMUNITY)
Admission: EM | Admit: 2024-05-13 | Discharge: 2024-05-13 | Disposition: A | Discharge status: Home / Self Care | Attending: Emergency Medicine | Admitting: Emergency Medicine

## 2024-05-13 ENCOUNTER — Encounter (HOSPITAL_COMMUNITY): Payer: Self-pay | Admitting: Emergency Medicine

## 2024-05-13 ENCOUNTER — Other Ambulatory Visit: Payer: Self-pay

## 2024-05-13 VITALS — BP 144/86 | HR 80 | Temp 98.6°F | Ht 66.0 in | Wt 133.0 lb

## 2024-05-13 DIAGNOSIS — Z7982 Long term (current) use of aspirin: Secondary | ICD-10-CM | POA: Diagnosis not present

## 2024-05-13 DIAGNOSIS — I10 Essential (primary) hypertension: Secondary | ICD-10-CM | POA: Diagnosis not present

## 2024-05-13 DIAGNOSIS — R059 Cough, unspecified: Secondary | ICD-10-CM | POA: Diagnosis not present

## 2024-05-13 DIAGNOSIS — E113511 Type 2 diabetes mellitus with proliferative diabetic retinopathy with macular edema, right eye: Secondary | ICD-10-CM | POA: Diagnosis not present

## 2024-05-13 DIAGNOSIS — D72829 Elevated white blood cell count, unspecified: Secondary | ICD-10-CM | POA: Insufficient documentation

## 2024-05-13 DIAGNOSIS — N3000 Acute cystitis without hematuria: Secondary | ICD-10-CM | POA: Insufficient documentation

## 2024-05-13 DIAGNOSIS — R531 Weakness: Secondary | ICD-10-CM

## 2024-05-13 LAB — BASIC METABOLIC PANEL WITH GFR
Anion gap: 12 (ref 5–15)
BUN: 28 mg/dL — ABNORMAL HIGH (ref 6–20)
CO2: 21 mmol/L — ABNORMAL LOW (ref 22–32)
Calcium: 9.2 mg/dL (ref 8.9–10.3)
Chloride: 105 mmol/L (ref 98–111)
Creatinine, Ser: 3.5 mg/dL — ABNORMAL HIGH (ref 0.44–1.00)
GFR, Estimated: 15 mL/min — ABNORMAL LOW (ref >60)
Glucose, Bld: 190 mg/dL — ABNORMAL HIGH (ref 70–99)
Potassium: 4.6 mmol/L (ref 3.5–5.1)
Sodium: 138 mmol/L (ref 135–145)

## 2024-05-13 LAB — CBC WITH DIFFERENTIAL/PLATELET
Abs Immature Granulocytes: 0.06 K/uL (ref 0.00–0.07)
Basophils Absolute: 0.1 K/uL (ref 0.0–0.1)
Basophils Relative: 1 %
Eosinophils Absolute: 0.1 K/uL (ref 0.0–0.5)
Eosinophils Relative: 1 %
HCT: 30.4 % — ABNORMAL LOW (ref 36.0–46.0)
Hemoglobin: 9.5 g/dL — ABNORMAL LOW (ref 12.0–15.0)
Immature Granulocytes: 0 %
Lymphocytes Relative: 7 %
Lymphs Abs: 1.2 K/uL (ref 0.7–4.0)
MCH: 28.4 pg (ref 26.0–34.0)
MCHC: 31.3 g/dL (ref 30.0–36.0)
MCV: 91 fL (ref 80.0–100.0)
Monocytes Absolute: 0.7 K/uL (ref 0.1–1.0)
Monocytes Relative: 4 %
Neutro Abs: 15 K/uL — ABNORMAL HIGH (ref 1.7–7.7)
Neutrophils Relative %: 87 %
Platelets: 387 K/uL (ref 150–400)
RBC: 3.34 MIL/uL — ABNORMAL LOW (ref 3.87–5.11)
RDW: 14.6 % (ref 11.5–15.5)
WBC: 17.2 K/uL — ABNORMAL HIGH (ref 4.0–10.5)
nRBC: 0 % (ref 0.0–0.2)

## 2024-05-13 LAB — URINALYSIS, ROUTINE W REFLEX MICROSCOPIC
Bilirubin Urine: NEGATIVE
Glucose, UA: 150 mg/dL — AB
Hgb urine dipstick: NEGATIVE
Ketones, ur: 5 mg/dL — AB
Leukocytes,Ua: NEGATIVE
Nitrite: NEGATIVE
Protein, ur: >=300 mg/dL — AB
Specific Gravity, Urine: 1.026 (ref 1.005–1.030)
pH: 5 (ref 5.0–8.0)

## 2024-05-13 LAB — HEPATIC FUNCTION PANEL
ALT: 10 U/L (ref 0–44)
AST: 11 U/L — ABNORMAL LOW (ref 15–41)
Albumin: 3 g/dL — ABNORMAL LOW (ref 3.5–5.0)
Alkaline Phosphatase: 47 U/L (ref 38–126)
Bilirubin, Direct: 0.1 mg/dL (ref 0.0–0.2)
Indirect Bilirubin: 0.8 mg/dL (ref 0.3–0.9)
Total Bilirubin: 0.9 mg/dL (ref 0.0–1.2)
Total Protein: 6.6 g/dL (ref 6.5–8.1)

## 2024-05-13 LAB — CBG MONITORING, ED: Glucose-Capillary: 139 mg/dL — ABNORMAL HIGH (ref 70–99)

## 2024-05-13 MED ORDER — CEPHALEXIN 500 MG PO CAPS: 500.0000 mg | ORAL_CAPSULE | Freq: Four times a day (QID) | ORAL | 0 refills | Status: DC

## 2024-05-13 MED ORDER — CEPHALEXIN 500 MG PO CAPS
500.0000 mg | ORAL_CAPSULE | Freq: Once | ORAL | Status: AC
  Administered 2024-05-13: 500 mg via ORAL
  Filled 2024-05-13: qty 1

## 2024-05-13 MED ORDER — SODIUM CHLORIDE 0.9 % IV BOLUS
1000.0000 mL | Freq: Once | INTRAVENOUS | Status: AC
  Administered 2024-05-13: 1000 mL via INTRAVENOUS

## 2024-05-13 MED ORDER — AMLODIPINE BESYLATE 5 MG PO TABS: 5.0000 mg | ORAL_TABLET | Freq: Every day | ORAL | 1 refills | Status: DC

## 2024-05-13 NOTE — ED Provider Notes (Signed)
 [place] EMERGENCY DEPARTMENT AT [facility] Provider Note   CSN: [id number] Arrival date & time: 05/13/24  1648     Patient presents with: Weakness   Lori Fleming is a 53 y.o. female.  {Add pertinent medical, surgical, social history, OB history to YEP:67052} Patient states she feels kind of weak today.  She has not eaten today also   Weakness      Prior to Admission medications   Medication Sig Start Date End Date Taking? Authorizing Provider  cephALEXin  (KEFLEX ) 500 MG capsule Take 1 capsule (500 mg total) by mouth 4 (four) times daily. 05/13/24  Yes Suzette Pac, MD  amLODipine  (NORVASC ) 5 MG tablet Take 1 tablet (5 mg total) by mouth daily. 05/13/24   Geofm Glade PARAS, MD  aspirin  EC 81 MG tablet Take 81 mg by mouth daily. Swallow whole.    [provider]  brimonidine  (ALPHAGAN ) 0.2 % ophthalmic solution Place 1 drop into the left eye 3 (three) times daily. 03/19/22   Raford Lenis, MD  dorzolamide -timolol  (COSOPT ) 22.3-6.8 MG/ML ophthalmic solution Place 1 drop into the left eye 2 (two) times daily. 05/13/22   Patsy Lenis, MD  hydrALAZINE  (APRESOLINE ) 50 MG tablet Take 50 mg by mouth 2 (two) times daily. 05/06/24   [provider]  AISHA SINKS test strip USE DAILY TO CHECK SUGAR 07/09/22   Rollene Almarie LABOR, MD    Allergies: Patient has no known allergies.    Review of Systems  Neurological:  Positive for weakness.    Updated Vital Signs BP (!) 180/88   Pulse 78   Temp 98.2 F (36.8 C) (Oral)   Resp 18   SpO2 98%   Physical Exam  (all labs ordered are listed, but only abnormal results are displayed) Labs Reviewed  CBC WITH DIFFERENTIAL/PLATELET - Abnormal; Notable for the following components:      Result Value   WBC 17.2 (*)    RBC 3.34 (*)    Hemoglobin 9.5 (*)    HCT 30.4 (*)    Neutro Abs 15.0 (*)    All other components within normal limits  BASIC METABOLIC PANEL WITH GFR - Abnormal; Notable for the following  components:   CO2 21 (*)    Glucose, Bld 190 (*)    BUN 28 (*)    Creatinine, Ser 3.50 (*)    GFR, Estimated 15 (*)    All other components within normal limits  URINALYSIS, ROUTINE W REFLEX MICROSCOPIC - Abnormal; Notable for the following components:   Color, Urine AMBER (*)    APPearance CLOUDY (*)    Glucose, UA 150 (*)    Ketones, ur 5 (*)    Protein, ur >=300 (*)    Bacteria, UA RARE (*)    All other components within normal limits  HEPATIC FUNCTION PANEL - Abnormal; Notable for the following components:   Albumin 3.0 (*)    AST 11 (*)    All other components within normal limits  CBG MONITORING, ED - Abnormal; Notable for the following components:   Glucose-Capillary 139 (*)    All other components within normal limits  URINE CULTURE    EKG: None  Radiology: DG Chest 2 View Result Date: 05/13/2024 CLINICAL DATA:  Cough. EXAM: CHEST - 2 VIEW COMPARISON:  Chest radiograph dated 05/11/2022. FINDINGS: The heart size and mediastinal contours are within normal limits. Both lungs are clear. The visualized skeletal structures are unremarkable. IMPRESSION: No active cardiopulmonary disease. Electronically Signed   By:  Vanetta Chou M.D.   On: 05/13/2024 19:33    {Document cardiac monitor, telemetry assessment procedure when appropriate:32947} Procedures   Medications Ordered in the ED  cephALEXin  (KEFLEX ) capsule 500 mg (has no administration in time range)  sodium chloride  0.9 % bolus 1,000 mL (1,000 mLs Intravenous New Bag/Given 05/13/24 1942)      {Click here for ABCD2, HEART and other calculators REFRESH Note before signing:1}                              Medical Decision Making Amount and/or Complexity of Data Reviewed Labs: ordered. Radiology: ordered.  Risk Prescription drug management.   Patient with possible UTI and dehydration.  She feels better with fluids.  She will be sent home with Keflex  and follow-up with PCP  {Document critical care time when  appropriate  Document review of labs and clinical decision tools ie CHADS2VASC2, etc  Document your independent review of radiology images and any outside records  Document your discussion with family members, caretakers and with consultants  Document social determinants of health affecting pt's care  Document your decision making why or why not admission, treatments were needed:32947:::1}   Final diagnoses:  Weakness  Acute cystitis without hematuria    ED Discharge Orders          Ordered    cephALEXin  (KEFLEX ) 500 MG capsule  4 times daily        05/13/24 2006

## 2024-05-13 NOTE — ED Triage Notes (Signed)
 Pt here via ems after pt with episode of weakness after touring school at back to school night. Nurse at school gave pt glucose. Family did not check cbg prior to giving glucose. CBG with EMS 144. Pt now states she feels much better. She did not eat today.

## 2024-05-13 NOTE — Discharge Instructions (Signed)
Drink plenty of fluids.  Follow-up with your family doctor next week °

## 2024-05-14 ENCOUNTER — Encounter: Attending: Nephrology | Admitting: Nutrition

## 2024-05-14 DIAGNOSIS — N184 Chronic kidney disease, stage 4 (severe): Secondary | ICD-10-CM | POA: Diagnosis not present

## 2024-05-14 DIAGNOSIS — E118 Type 2 diabetes mellitus with unspecified complications: Secondary | ICD-10-CM | POA: Insufficient documentation

## 2024-05-14 NOTE — Progress Notes (Signed)
 Medical Nutrition Therapy  Appointment Start time:  0800  Appointment End time:  0930  Primary concerns today: CKD  Referral diagnosis: N18.4 Preferred learning style: No Preference  Learning readiness: Ready    NUTRITION ASSESSMENT  53 yr old bfemale referred for ESRD. PCP Dr. Rollene. Nephrologist Dr. Melia. PMH: Type 2 DM,HTN,    Not testing blood sugars  Use Januvia  but ran out.  Unable to see out of left eye and cataract in right eye; surgery on Monday Nephrologist DR. Lynwood Melia. Had been on Losartan  but potassium went high and he stopped medications. Hydrolazide used for BP 50 mg  to start TID and now on Amlodipine  started yesterday due to elevated BP.  Is willing to work on diet and start back testing blood sugars to improve her health. Diet is insuffient to meet her needs.   She does not have much social support. Someone drives her for her appointments. Would benefit from referral to care coordinator, [facility] or [facility] ambassador.   Clinical Medical Hx:  Past Medical History:  Diagnosis Date   Chronic kidney disease    Diabetes mellitus without complication (HCC)    Hypertension    Stroke (HCC)     Medications:  Current Outpatient Medications on File Prior to Visit  Medication Sig Dispense Refill   amLODipine  (NORVASC ) 5 MG tablet Take 1 tablet (5 mg total) by mouth daily. 90 tablet 1   aspirin  EC 81 MG tablet Take 81 mg by mouth daily. Swallow whole.     brimonidine  (ALPHAGAN ) 0.2 % ophthalmic solution Place 1 drop into the left eye 3 (three) times daily. 5 mL 12   cephALEXin  (KEFLEX ) 500 MG capsule Take 1 capsule (500 mg total) by mouth 4 (four) times daily. 28 capsule 0   cholecalciferol (VITAMIN D3) 25 MCG (1000 UNIT) tablet Take 2,000 Units by mouth daily.     dorzolamide -timolol  (COSOPT ) 22.3-6.8 MG/ML ophthalmic solution Place 1 drop into the left eye 2 (two) times daily. 10 mL 3   hydrALAZINE  (APRESOLINE ) 50 MG tablet Take 50 mg by mouth 2 (two) times daily.      sodium bicarbonate  650 MG tablet Take 650 mg by mouth 3 (three) times daily.     ONETOUCH VERIO test strip USE DAILY TO CHECK SUGAR 100 strip 12   No current facility-administered medications on file prior to visit.    Labs:  Lab Results  Component Value Date   HGBA1C 7.6 (H) 12/23/2023      Latest Ref Rng & Units 05/13/2024    5:05 PM 02/03/2024    8:15 AM 12/23/2023    1:33 PM  CMP  Glucose 70 - 99 mg/dL 809  96  843   BUN 6 - 20 mg/dL 28  23  26    Creatinine 0.44 - 1.00 mg/dL 6.49  5.93  6.49   Sodium 135 - 145 mmol/L 138  138  138   Potassium 3.5 - 5.1 mmol/L 4.6  4.3  4.5   Chloride 98 - 111 mmol/L 105  105  108   CO2 22 - 32 mmol/L 21  21  23    Calcium  8.9 - 10.3 mg/dL 9.2  9.1  8.4   Total Protein 6.5 - 8.1 g/dL 6.6   6.1   Total Bilirubin 0.0 - 1.2 mg/dL 0.9   0.3   Alkaline Phos 38 - 126 U/L 47   58   AST 15 - 41 U/L 11   9   ALT 0 -  44 U/L 10   8    Lipid Panel     Component Value Date/Time   CHOL 277 (H) 12/23/2023 1333   TRIG 128.0 12/23/2023 1333   HDL 66.50 12/23/2023 1333   CHOLHDL 4 12/23/2023 1333   VLDL 25.6 12/23/2023 1333   LDLCALC 185 (H) 12/23/2023 1333   LDLCALC 187 (H) 10/21/2020 1705     Notable Signs/Symptoms: Fatigue, poor energy  Lifestyle & Dietary Hx LIves with husband and nephew  Estimated daily fluid intake: 60  oz Supplements: Vit D Sleep: poor Stress / self-care: her health and trying to do for others Current average weekly physical activity: ADL  24-Hr Dietary Recall B) 11 am fruit cup,  L) 1-2 pm  Fish sandwich fried from [employer], fries, water D) 7pm: CHicken sandwich, or chicken salad or  cesear salad, water   If eats vegetables;= usually a salad, cucumbers, tomato, broccoli, kale, spinach,  Fruits: watermelon, grapes, bananas, canteloupe, apples,  Estimated Energy Needs Calories: 1800 Carbohydrate: 200g Protein: 150g Fat: 56g   NUTRITION DIAGNOSIS  NB-1.1 Food and nutrition-related knowledge deficit As  related to chronic kidney disease and uncontrolled type 2 Dm.  As evidenced by A1c 7.6%, Creatinine 3.5, Albumin 3.0 low..   NUTRITION INTERVENTION  Nutrition education (E-1) on the following topics:  Chronic Kidney and renal diet [employer] Website for video's, handouts and education.  Handouts Provided Include  Kidney Friendly diet Diabetic goals log sheets  Learning Style & Readiness for Change Teaching method utilized: Visual & Auditory  Demonstrated degree of understanding via: Teach Back  Barriers to learning/adherence to lifestyle change: stress of having to do all her medical appointments without much support.  Goals Established by Pt Goals Go to [email or web address] for further education Start reading food labels Avoid processed and fast foods Focus more  fresh or frozen fruits, vegetables and whole grains that are allowed on diet given Drink only water Testing blood sugars twice a day Avoid salty foods--read food labels and avoid foods with more than 200 mg per serving. Work on cooking more meals at home and eat out less Pick up strips to start testing daily Get A1C to 7% or below Call MD about diabetes medications of Januvia .   MONITORING & EVALUATION Dietary intake, weekly physical activity, and blood sugars  in 1 month.  Next Steps  Patient is to work on better meal planning and following renal restrictions.SABRA

## 2024-05-14 NOTE — Patient Instructions (Signed)
 Goals Go to [email or web address] for further education Start reading food labels Avoid processed and fast foods Focus more  fresh or frozen fruits, vegetables and whole grains that are allowed on diet given Drink only water Testing blood sugars twice a day Avoid salty foods--read food labels and avoid foods with more than 200 mg per serving. Work on cooking more meals at home and eat out less Pick up strips to start testing daily Get A1C to 7% or below Call MD about diabetes medications of Januvia .

## 2024-05-15 ENCOUNTER — Telehealth: Payer: Self-pay

## 2024-05-15 ENCOUNTER — Ambulatory Visit (HOSPITAL_COMMUNITY)
Admission: RE | Admit: 2024-05-15 | Discharge: 2024-05-15 | Disposition: A | Discharge status: Home / Self Care | Source: Ambulatory Visit | Attending: Nurse Practitioner | Admitting: Nurse Practitioner

## 2024-05-15 ENCOUNTER — Other Ambulatory Visit

## 2024-05-15 DIAGNOSIS — N852 Hypertrophy of uterus: Secondary | ICD-10-CM | POA: Diagnosis not present

## 2024-05-15 DIAGNOSIS — D259 Leiomyoma of uterus, unspecified: Secondary | ICD-10-CM | POA: Diagnosis not present

## 2024-05-15 DIAGNOSIS — N858 Other specified noninflammatory disorders of uterus: Secondary | ICD-10-CM | POA: Diagnosis not present

## 2024-05-15 LAB — URINE CULTURE: Culture: <10000 — AB

## 2024-05-15 NOTE — Telephone Encounter (Signed)
 Got a message from my provider asking to get patient scheduled for a office visit as we do not have a recent A1C on her.

## 2024-05-15 NOTE — Telephone Encounter (Signed)
 I have called patient and she is scheduled to be seen and in office with Dr Rollene on the Sept 2nd.

## 2024-05-18 DIAGNOSIS — H2511 Age-related nuclear cataract, right eye: Secondary | ICD-10-CM | POA: Diagnosis not present

## 2024-05-18 DIAGNOSIS — H25011 Cortical age-related cataract, right eye: Secondary | ICD-10-CM | POA: Diagnosis not present

## 2024-05-18 DIAGNOSIS — H25811 Combined forms of age-related cataract, right eye: Secondary | ICD-10-CM | POA: Diagnosis not present

## 2024-05-21 ENCOUNTER — Encounter: Payer: Self-pay | Admitting: Nutrition

## 2024-05-26 ENCOUNTER — Ambulatory Visit (INDEPENDENT_AMBULATORY_CARE_PROVIDER_SITE_OTHER): Admitting: Internal Medicine

## 2024-05-26 ENCOUNTER — Encounter: Payer: Self-pay | Admitting: Internal Medicine

## 2024-05-26 VITALS — BP 138/62 | HR 99 | Temp 98.4°F | Ht 66.0 in | Wt 141.0 lb

## 2024-05-26 DIAGNOSIS — N184 Chronic kidney disease, stage 4 (severe): Secondary | ICD-10-CM

## 2024-05-26 DIAGNOSIS — Z8673 Personal history of transient ischemic attack (TIA), and cerebral infarction without residual deficits: Secondary | ICD-10-CM | POA: Diagnosis not present

## 2024-05-26 DIAGNOSIS — E1169 Type 2 diabetes mellitus with other specified complication: Secondary | ICD-10-CM | POA: Diagnosis not present

## 2024-05-26 DIAGNOSIS — Z23 Encounter for immunization: Secondary | ICD-10-CM

## 2024-05-26 DIAGNOSIS — I10 Essential (primary) hypertension: Secondary | ICD-10-CM | POA: Diagnosis not present

## 2024-05-26 DIAGNOSIS — E785 Hyperlipidemia, unspecified: Secondary | ICD-10-CM

## 2024-05-26 DIAGNOSIS — E118 Type 2 diabetes mellitus with unspecified complications: Secondary | ICD-10-CM

## 2024-05-26 LAB — LIPID PANEL
Cholesterol: 205 mg/dL — ABNORMAL HIGH (ref 0–200)
HDL: 50.3 mg/dL (ref >39.00)
LDL Cholesterol: 140 mg/dL — ABNORMAL HIGH (ref 0–99)
NonHDL: 155.15
Total CHOL/HDL Ratio: 4
Triglycerides: 78 mg/dL (ref 0.0–149.0)
VLDL: 15.6 mg/dL (ref 0.0–40.0)

## 2024-05-26 LAB — COMPREHENSIVE METABOLIC PANEL WITH GFR
ALT: 10 U/L (ref 0–35)
AST: 10 U/L (ref 0–37)
Albumin: 3.5 g/dL (ref 3.5–5.2)
Alkaline Phosphatase: 41 U/L (ref 39–117)
BUN: 25 mg/dL — ABNORMAL HIGH (ref 6–23)
CO2: 21 meq/L (ref 19–32)
Calcium: 8.9 mg/dL (ref 8.4–10.5)
Chloride: 105 meq/L (ref 96–112)
Creatinine, Ser: 3.13 mg/dL — ABNORMAL HIGH (ref 0.40–1.20)
GFR: 16.45 mL/min — ABNORMAL LOW (ref >60.00)
Glucose, Bld: 95 mg/dL (ref 70–99)
Potassium: 5 meq/L (ref 3.5–5.1)
Sodium: 137 meq/L (ref 135–145)
Total Bilirubin: 0.3 mg/dL (ref 0.2–1.2)
Total Protein: 6.8 g/dL (ref 6.0–8.3)

## 2024-05-26 LAB — MICROALBUMIN / CREATININE URINE RATIO
Creatinine,U: 146 mg/dL
Microalb Creat Ratio: 2403.9 mg/g — ABNORMAL HIGH (ref 0.0–30.0)
Microalb, Ur: 351 mg/dL — ABNORMAL HIGH (ref 0.0–1.9)

## 2024-05-26 LAB — HEMOGLOBIN A1C: Hgb A1c MFr Bld: 6 % (ref 4.6–6.5)

## 2024-05-26 MED ORDER — ROSUVASTATIN CALCIUM 20 MG PO TABS: 20.0000 mg | ORAL_TABLET | Freq: Every day | ORAL | 3 refills | Status: AC

## 2024-05-26 MED ORDER — ONETOUCH VERIO VI STRP: ORAL_STRIP | 12 refills | Status: DC

## 2024-05-26 NOTE — Progress Notes (Signed)
 "  Subjective:   Patient ID: Lori Fleming, female    DOB: 09/07/1971, 53 y.o.   MRN: [id number]  Discussed the use of AI scribe software for clinical note transcription with the patient, who gave verbal consent to proceed.  History of Present Illness Lori Fleming is a 54 year old female who presents for follow-up after cataract surgery and recent er visit for dizziness and infection.  She underwent cataract surgery on her right eye last Monday and has experienced improved vision without pain. She was initially anxious about the procedure due to a previous traumatic experience with her left eye, which continues to cause issues. The left eye recently bled, and she was prescribed prednisone drops and ointment for corneal issues.  She has a history of diabetes and has been advised by her nutritionist to monitor her blood sugar levels more closely. Her last A1c was 7.5 in March, and she has not been using her test strips regularly. She is not on medication for diabetes at this time.  She also has hypertension and was recently prescribed amlodipine  after a high reading. She was advised to take hydralazine  three times a day for fast-acting blood pressure control.  She experienced an episode of dizziness and lightheadedness after not eating for an extended period while attending multiple appointments and a school open house. This led to an EMS call and hospitalization, where she was found to have an infection and was treated with antibiotics.  She has chronic kidney disease and has been in discussions about potential dialysis. She has undergone vascular studies which showed small vessels, indicating a graft may be necessary if dialysis is needed.  She has been off her cholesterol medication, rosuvastatin , due to prescription refill issues. No new numbness or burning in her feet.   Review of Systems  Constitutional: Negative.   HENT: Negative.    Eyes: Negative.   Respiratory:  Negative for  cough, chest tightness and shortness of breath.   Cardiovascular:  Negative for chest pain, palpitations and leg swelling.  Gastrointestinal:  Negative for abdominal distention, abdominal pain, constipation, diarrhea, nausea and vomiting.  Musculoskeletal: Negative.   Skin: Negative.   Neurological: Negative.   Psychiatric/Behavioral: Negative.      Objective:  Physical Exam Constitutional:      Appearance: She is well-developed.  HENT:     Head: Normocephalic and atraumatic.  Cardiovascular:     Rate and Rhythm: Normal rate and regular rhythm.  Pulmonary:     Effort: Pulmonary effort is normal. No respiratory distress.     Breath sounds: Normal breath sounds. No wheezing or rales.  Abdominal:     General: Bowel sounds are normal. There is no distension.     Palpations: Abdomen is soft.     Tenderness: There is no abdominal tenderness. There is no rebound.  Musculoskeletal:     Cervical back: Normal range of motion.  Skin:    General: Skin is warm and dry.  Neurological:     Mental Status: She is alert and oriented to person, place, and time.     Coordination: Coordination normal.     Vitals:   05/26/24 1536  BP: 138/62  Pulse: 99  Temp: 98.4 F (36.9 C)  TempSrc: Oral  SpO2: 98%  Weight: 141 lb (64 kg)  Height: 5' 6 (1.676 m)    Assessment and Plan Assessment & Plan Type 2 diabetes mellitus   Type 2 diabetes mellitus with an A1c of 7.5% back in March, checking HgA1c  today. She experienced hypoglycemia due to not eating, leading to ER visit. Refill test strips for blood glucose monitoring. Check A1c today and consider resuming Januvia  based on results.  Hypertension   Hypertension is borderline. Amlodipine  was added by Dr. Geofm. Dizziness and lightheadedness are likely due to fasting and medication timing. Continue amlodipine  and monitor blood pressure regularly.  Chronic kidney disease   Chronic kidney disease. Evaluated for dialysis access; vessels are too  small for a fistula, so a graft may be necessary. The goal is to delay dialysis. Follow up with a nephrologist as needed.  Dyslipidemia associated with type 2 diabetes   Dyslipidemia associated with type 2 diabetes. She was previously on rosuvastatin  but is not taking it due to refill issues. Send a prescription for rosuvastatin  (Crestor ).   "

## 2024-05-26 NOTE — Patient Instructions (Signed)
 We will check the labs for the sugars today.

## 2024-05-27 ENCOUNTER — Ambulatory Visit: Payer: Self-pay | Admitting: Internal Medicine

## 2024-05-28 ENCOUNTER — Encounter: Payer: Self-pay | Admitting: Internal Medicine

## 2024-05-28 NOTE — Assessment & Plan Note (Signed)
 Rx crestor  20 mg daily she was off due to not getting refills. Checking lipid panel today.SABRA

## 2024-05-28 NOTE — Assessment & Plan Note (Signed)
 Most recent GFR 15 and has been both above and below 15 in the last 6 months. Checking CMP today for follow up. She has had initial workup for access and dialysis teaching and seeing nephrology regularly which she will continue seeing them.

## 2024-05-28 NOTE — Assessment & Plan Note (Signed)
 BP at goal today will not increase amlodipine  dosing. She is encouraged to monitor readings at home closely. She knows that tight control of BP will help limit progression to dialysis.

## 2024-05-28 NOTE — Assessment & Plan Note (Signed)
 Checking lipid panel and is taking aspirin  daily. Continue and rx statin as she should be on this.

## 2024-05-28 NOTE — Assessment & Plan Note (Signed)
 Complicated by CKD stage 4 and hyperlipidemia. Checking UACR and HGA1c. Rx crestor  as not on statin. Rx for diabetes with tight control HgA1c <7 appropriate.

## 2024-06-02 DIAGNOSIS — I7090 Unspecified atherosclerosis: Secondary | ICD-10-CM | POA: Diagnosis not present

## 2024-06-02 DIAGNOSIS — Z01818 Encounter for other preprocedural examination: Secondary | ICD-10-CM | POA: Diagnosis not present

## 2024-06-02 DIAGNOSIS — N184 Chronic kidney disease, stage 4 (severe): Secondary | ICD-10-CM | POA: Diagnosis not present

## 2024-06-05 ENCOUNTER — Telehealth (INDEPENDENT_AMBULATORY_CARE_PROVIDER_SITE_OTHER): Payer: Self-pay

## 2024-06-05 NOTE — Telephone Encounter (Signed)
 Who is your primary care physician: Lori Fleming  Reasons for the colonoscopy: screening  Have you had a colonoscopy before?  no  Do you have family history of colon cancer? no  Previous colonoscopy with polyps removed? no  Do you have a history colorectal cancer?   no  Are you diabetic? If yes, Type 1 or Type 2?    Yes, type 2  Do you have a prosthetic or mechanical heart valve? no  Do you have a pacemaker/defibrillator?   no  Have you had endocarditis/atrial fibrillation? no  Have you had joint replacement within the last 12 months?  no  Do you tend to be constipated or have to use laxatives? yes  Do you have any history of drugs or alchohol?  no  Do you use supplemental oxygen?  no  Have you had a stroke or heart attack within the last 6 months? no  Do you take weight loss medication?  no  For female patients: have you had a hysterectomy?  no                                     are you post menopausal?       no                                            do you still have your menstrual cycle? no      Do you take any blood-thinning medications such as: (aspirin , warfarin, Plavix , Aggrenox)  no  If yes we need the name, milligram, dosage and who is prescribing doctor  Current Outpatient Medications on File Prior to Visit  Medication Sig Dispense Refill   amLODipine  (NORVASC ) 5 MG tablet Take 1 tablet (5 mg total) by mouth daily. 90 tablet 1   aspirin  EC 81 MG tablet Take 81 mg by mouth daily. Swallow whole.     brimonidine  (ALPHAGAN ) 0.2 % ophthalmic solution Place 1 drop into the left eye 3 (three) times daily. 5 mL 12   dorzolamide -timolol  (COSOPT ) 22.3-6.8 MG/ML ophthalmic solution Place 1 drop into the left eye 2 (two) times daily. 10 mL 3   hydrALAZINE  (APRESOLINE ) 50 MG tablet Take 50 mg by mouth 2 (two) times daily.     rosuvastatin  (CRESTOR ) 20 MG tablet Take 1 tablet (20 mg total) by mouth daily. 90 tablet 3   sodium bicarbonate  650 MG tablet Take 650 mg  by mouth 3 (three) times daily.     No current facility-administered medications on file prior to visit.    No Known Allergies   Pharmacy: [facility] Name: [facility]  Best number where you can be reached: [phone]

## 2024-06-05 NOTE — Telephone Encounter (Signed)
Room 3 Thanks 

## 2024-06-08 MED ORDER — PEG 3350-KCL-NA BICARB-NACL 420 G PO SOLR
4000.0000 mL | Freq: Once | ORAL | 0 refills | Status: AC
Start: 2024-06-08 — End: 2024-06-08

## 2024-06-08 NOTE — Addendum Note (Signed)
 Addended by: DALLIE LIONEL RAMAN on: 06/08/2024 03:09 PM   Modules accepted: Orders

## 2024-06-09 DIAGNOSIS — Z01818 Encounter for other preprocedural examination: Secondary | ICD-10-CM | POA: Diagnosis not present

## 2024-06-09 NOTE — Telephone Encounter (Signed)
 Called and spoke with patient, gave pre-op  appointment details for 06/25/2024 at 9:30am. Patient aware.

## 2024-06-10 ENCOUNTER — Telehealth: Payer: Self-pay

## 2024-06-10 ENCOUNTER — Encounter (INDEPENDENT_AMBULATORY_CARE_PROVIDER_SITE_OTHER): Payer: Self-pay | Admitting: *Deleted

## 2024-06-10 NOTE — Telephone Encounter (Signed)
 Referral completed, [facility] apt letter sent to PCP

## 2024-06-10 NOTE — Telephone Encounter (Signed)
 Per PCP note on 9/2, patient's goal is to delay dialysis as long as possible.  Surgery paperwork from Dr. Serene sent to scanning.

## 2024-06-11 DIAGNOSIS — E1122 Type 2 diabetes mellitus with diabetic chronic kidney disease: Secondary | ICD-10-CM | POA: Diagnosis not present

## 2024-06-11 DIAGNOSIS — N184 Chronic kidney disease, stage 4 (severe): Secondary | ICD-10-CM | POA: Diagnosis not present

## 2024-06-11 DIAGNOSIS — D509 Iron deficiency anemia, unspecified: Secondary | ICD-10-CM | POA: Diagnosis not present

## 2024-06-11 DIAGNOSIS — I129 Hypertensive chronic kidney disease with stage 1 through stage 4 chronic kidney disease, or unspecified chronic kidney disease: Secondary | ICD-10-CM | POA: Diagnosis not present

## 2024-06-17 ENCOUNTER — Encounter: Attending: Internal Medicine | Admitting: Nutrition

## 2024-06-17 ENCOUNTER — Encounter: Payer: Self-pay | Admitting: Nutrition

## 2024-06-17 VITALS — Ht 66.0 in | Wt 143.0 lb

## 2024-06-17 DIAGNOSIS — I10 Essential (primary) hypertension: Secondary | ICD-10-CM | POA: Insufficient documentation

## 2024-06-17 DIAGNOSIS — E785 Hyperlipidemia, unspecified: Secondary | ICD-10-CM | POA: Diagnosis not present

## 2024-06-17 DIAGNOSIS — N184 Chronic kidney disease, stage 4 (severe): Secondary | ICD-10-CM | POA: Insufficient documentation

## 2024-06-17 DIAGNOSIS — E118 Type 2 diabetes mellitus with unspecified complications: Secondary | ICD-10-CM | POA: Insufficient documentation

## 2024-06-17 DIAGNOSIS — E1169 Type 2 diabetes mellitus with other specified complication: Secondary | ICD-10-CM | POA: Diagnosis not present

## 2024-06-17 NOTE — Patient Instructions (Signed)
 Goals Keep up the great job! Keep focusing on whole plant base foods Keep drinking water

## 2024-06-17 NOTE — Progress Notes (Signed)
 Medical Nutrition Therapy  Appointment Start time:  1300  Appointment End time:  1330  Primary concerns today: CKD  Referral diagnosis: N18.4 Preferred learning style: No Preference  Learning readiness: Ready    NUTRITION ASSESSMENT  53 yr old bfemale referred for ESRD. PCP Dr. Rollene. Nephrologist Dr. Melia. PMH: Type 2 DM,HTN,  Put back on 10 lbs of her weight. Had cataract removed in her right eye. She can now see out of her eye and is so happy to see again. 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 Depression is much better now that she can see. She feels like she has hope again and can particpate in life. Back to driving for more independence. In the process of getting a kidney transplant starting with paperwork. A1C 6%. Got her renal vite MVI  Has been working on eating more 'whole plant based foods' and has cut out canned and processed foods. Has more energy. Feels so much better. No Januvia . No DM medications. Creatine and eGFR improving slightly. Dr. Melia CKD MD. Hyperlipidemia and is now on Crestor .  Clinical Wt Readings from Last 3 Encounters:  06/17/24 143 lb (64.9 kg)  05/26/24 141 lb (64 kg)  05/13/24 133 lb (60.3 kg)   Ht Readings from Last 3 Encounters:  06/17/24 5' 6 (1.676 m)  05/26/24 5' 6 (1.676 m)  05/13/24 5' 6 (1.676 m)   Body mass index is 23.08 kg/m. @BMIFA @ Facility age limit for growth %iles is 20 years. Facility age limit for growth %iles is 20 years.  Medical Hx:  Past Medical History:  Diagnosis Date   Chronic kidney disease    Diabetes mellitus without complication (HCC)    Hypertension    Stroke (HCC)     Medications:  Current Outpatient Medications on File Prior to Visit  Medication Sig Dispense Refill   amLODipine  (NORVASC ) 5 MG tablet Take 1 tablet (5 mg total) by mouth daily. 90 tablet 1   aspirin  EC 81 MG tablet Take 81 mg by mouth daily. Swallow whole.     brimonidine  (ALPHAGAN ) 0.2 % ophthalmic solution Place 1 drop into the left eye 3  (three) times daily. 5 mL 12   dorzolamide -timolol  (COSOPT ) 22.3-6.8 MG/ML ophthalmic solution Place 1 drop into the left eye 2 (two) times daily. 10 mL 3   hydrALAZINE  (APRESOLINE ) 50 MG tablet Take 50 mg by mouth 2 (two) times daily.     rosuvastatin  (CRESTOR ) 20 MG tablet Take 1 tablet (20 mg total) by mouth daily. 90 tablet 3   sodium bicarbonate  650 MG tablet Take 650 mg by mouth 3 (three) times daily.     No current facility-administered medications on file prior to visit.    Labs:  Lab Results  Component Value Date   HGBA1C 6.0 05/26/2024      Latest Ref Rng & Units 05/26/2024    4:11 PM 05/13/2024    5:05 PM 02/03/2024    8:15 AM  CMP  Glucose 70 - 99 mg/dL 95  809  96   BUN 6 - 23 mg/dL 25  28  23    Creatinine 0.40 - 1.20 mg/dL 6.86  6.49  5.93   Sodium 135 - 145 mEq/L 137  138  138   Potassium 3.5 - 5.1 mEq/L 5.0  4.6  4.3   Chloride 96 - 112 mEq/L 105  105  105   CO2 19 - 32 mEq/L 21  21  21    Calcium  8.4 - 10.5 mg/dL 8.9  9.2  9.1   Total Protein 6.0 - 8.3 g/dL 6.8  6.6    Total Bilirubin 0.2 - 1.2 mg/dL 0.3  0.9    Alkaline Phos 39 - 117 U/L 41  47    AST 0 - 37 U/L 10  11    ALT 0 - 35 U/L 10  10     Lipid Panel     Component Value Date/Time   CHOL 205 (H) 05/26/2024 1611   TRIG 78.0 05/26/2024 1611   HDL 50.30 05/26/2024 1611   CHOLHDL 4 05/26/2024 1611   VLDL 15.6 05/26/2024 1611   LDLCALC 140 (H) 05/26/2024 1611   LDLCALC 187 (H) 10/21/2020 1705     Notable Signs/Symptoms: Fatigue, poor energy  Lifestyle & Dietary Hx LIves with husband and nephew  Estimated daily fluid intake: 60  oz Supplements: Vit D Sleep: poor Stress / self-care: her health and trying to do for others Current average weekly physical activity: ADL  24-Hr Dietary Recall B) 11 am fruit cup, Eggs or cereals, L) 1-2 pm Meat and vegetables, fruit,  Water D) 7pm: Meat and vegetables and fruit, water If eats vegetables;= usually a salad, cucumbers, tomato, broccoli, kale,  spinach,  Fruits: watermelon, grapes, bananas, canteloupe, apples,  Estimated Energy Needs Calories: 1800 Carbohydrate: 200g Protein: 150g Fat: 56g   NUTRITION DIAGNOSIS  NB-1.1 Food and nutrition-related knowledge deficit As related to chronic kidney disease and uncontrolled type 2 Dm.  As evidenced by A1c 7.6%, Creatinine 3.5, Albumin 3.0 low..   NUTRITION INTERVENTION  Nutrition education (E-1) on the following topics:  Chronic Kidney and renal diet [employer] Website for video's, handouts and education.  Handouts Provided Include  Kidney Friendly diet Diabetic goals log sheets  Learning Style & Readiness for Change Teaching method utilized: Visual & Auditory  Demonstrated degree of understanding via: Teach Back  Barriers to learning/adherence to lifestyle change: stress of having to do all her medical appointments without much support.  Goals Established by Pt Goals Keep up the great job! Keep focusing on whole plant base foods Keep drinking water   MONITORING & EVALUATION Dietary intake, weekly physical activity, and blood sugars  in 3 month.  Next Steps  Patient is to work on better meal planning and following renal restrictions.SABRA

## 2024-06-25 ENCOUNTER — Encounter (HOSPITAL_COMMUNITY): Payer: Self-pay | Admitting: *Deleted

## 2024-06-25 ENCOUNTER — Encounter (HOSPITAL_COMMUNITY)
Admission: RE | Admit: 2024-06-25 | Discharge: 2024-06-25 | Disposition: A | Discharge status: Home / Self Care | Source: Ambulatory Visit | Attending: Gastroenterology | Admitting: Gastroenterology

## 2024-06-25 DIAGNOSIS — Z01818 Encounter for other preprocedural examination: Secondary | ICD-10-CM

## 2024-06-30 ENCOUNTER — Encounter (HOSPITAL_COMMUNITY): Payer: Self-pay | Admitting: Gastroenterology

## 2024-06-30 ENCOUNTER — Other Ambulatory Visit: Payer: Self-pay

## 2024-06-30 ENCOUNTER — Ambulatory Visit (HOSPITAL_COMMUNITY)
Admission: RE | Admit: 2024-06-30 | Discharge: 2024-06-30 | Disposition: A | Discharge status: Home / Self Care | Attending: Gastroenterology | Admitting: Gastroenterology

## 2024-06-30 ENCOUNTER — Encounter (INDEPENDENT_AMBULATORY_CARE_PROVIDER_SITE_OTHER): Payer: Self-pay | Admitting: *Deleted

## 2024-06-30 ENCOUNTER — Encounter (HOSPITAL_COMMUNITY)
Admission: RE | Disposition: A | Discharge status: Home / Self Care | Payer: Self-pay | Source: Home / Self Care | Attending: Gastroenterology

## 2024-06-30 ENCOUNTER — Ambulatory Visit (HOSPITAL_COMMUNITY)

## 2024-06-30 DIAGNOSIS — Z79899 Other long term (current) drug therapy: Secondary | ICD-10-CM | POA: Insufficient documentation

## 2024-06-30 DIAGNOSIS — Z7982 Long term (current) use of aspirin: Secondary | ICD-10-CM | POA: Insufficient documentation

## 2024-06-30 DIAGNOSIS — N184 Chronic kidney disease, stage 4 (severe): Secondary | ICD-10-CM | POA: Diagnosis not present

## 2024-06-30 DIAGNOSIS — E1122 Type 2 diabetes mellitus with diabetic chronic kidney disease: Secondary | ICD-10-CM | POA: Diagnosis not present

## 2024-06-30 DIAGNOSIS — Z1211 Encounter for screening for malignant neoplasm of colon: Secondary | ICD-10-CM | POA: Diagnosis not present

## 2024-06-30 DIAGNOSIS — I129 Hypertensive chronic kidney disease with stage 1 through stage 4 chronic kidney disease, or unspecified chronic kidney disease: Secondary | ICD-10-CM | POA: Diagnosis not present

## 2024-06-30 DIAGNOSIS — D125 Benign neoplasm of sigmoid colon: Secondary | ICD-10-CM | POA: Insufficient documentation

## 2024-06-30 DIAGNOSIS — Z8673 Personal history of transient ischemic attack (TIA), and cerebral infarction without residual deficits: Secondary | ICD-10-CM | POA: Insufficient documentation

## 2024-06-30 DIAGNOSIS — D214 Benign neoplasm of connective and other soft tissue of abdomen: Secondary | ICD-10-CM | POA: Diagnosis not present

## 2024-06-30 DIAGNOSIS — E119 Type 2 diabetes mellitus without complications: Secondary | ICD-10-CM | POA: Diagnosis not present

## 2024-06-30 DIAGNOSIS — Z01818 Encounter for other preprocedural examination: Secondary | ICD-10-CM

## 2024-06-30 DIAGNOSIS — Z139 Encounter for screening, unspecified: Secondary | ICD-10-CM | POA: Diagnosis not present

## 2024-06-30 DIAGNOSIS — K635 Polyp of colon: Secondary | ICD-10-CM | POA: Diagnosis not present

## 2024-06-30 DIAGNOSIS — Z833 Family history of diabetes mellitus: Secondary | ICD-10-CM | POA: Insufficient documentation

## 2024-06-30 HISTORY — PX: COLONOSCOPY: SHX5424

## 2024-06-30 LAB — POCT PREGNANCY, URINE: Preg Test, Ur: NEGATIVE

## 2024-06-30 LAB — GLUCOSE, CAPILLARY
Glucose-Capillary: 67 mg/dL — ABNORMAL LOW (ref 70–99)
Glucose-Capillary: 73 mg/dL (ref 70–99)

## 2024-06-30 LAB — HM COLONOSCOPY

## 2024-06-30 SURGERY — COLONOSCOPY
Anesthesia: General

## 2024-06-30 MED ORDER — PROPOFOL 500 MG/50ML IV EMUL
INTRAVENOUS | Status: DC | PRN
  Administered 2024-06-30: 200 ug/kg/min via INTRAVENOUS

## 2024-06-30 MED ORDER — SODIUM CHLORIDE 0.9 % IV SOLN: INTRAVENOUS | Status: DC

## 2024-06-30 MED ORDER — PROPOFOL 10 MG/ML IV BOLUS
INTRAVENOUS | Status: DC | PRN
  Administered 2024-06-30: 100 mg via INTRAVENOUS

## 2024-06-30 NOTE — Progress Notes (Signed)
 Pt's cbg was 67 soon after arriving to endo recovery.  Pt was able to drank some ginger ale and eat a couple of graham crackers. Pt alert and oriented. CBG rechecked and cbg came up to 73.  Encouraged pt and her husband to have her get something to eat after leaving to make sure blood glucose doesn't drop any lower.  Pt and husband verbalized understanding.

## 2024-06-30 NOTE — Transfer of Care (Signed)
 Immediate Anesthesia Transfer of Care Note  Patient: Lori Fleming  Procedure(s) Performed: COLONOSCOPY  Patient Location: Endoscopy Unit  Anesthesia Type:General  Level of Consciousness: awake, alert , oriented, and patient cooperative  Airway & Oxygen Therapy: Patient Spontanous Breathing  Post-op Assessment: Report given to RN, Post -op Vital signs reviewed and stable, and Patient moving all extremities X 4  Post vital signs: Reviewed and stable  Last Vitals:  Vitals Value Taken Time  BP    Temp    Pulse    Resp    SpO2      Last Pain:  Vitals:   06/30/24 0748  TempSrc: Oral  PainSc:          Complications: No notable events documented.

## 2024-06-30 NOTE — Anesthesia Postprocedure Evaluation (Signed)
 Anesthesia Post Note  Patient: Lori Fleming  Procedure(s) Performed: COLONOSCOPY  Patient location during evaluation: PACU Anesthesia Type: General Level of consciousness: awake and alert Pain management: pain level controlled Vital Signs Assessment: post-procedure vital signs reviewed and stable Respiratory status: spontaneous breathing, nonlabored ventilation, respiratory function stable and patient connected to nasal cannula oxygen Cardiovascular status: blood pressure returned to baseline and stable Postop Assessment: no apparent nausea or vomiting Anesthetic complications: no   No notable events documented.   Last Vitals:  Vitals:   06/30/24 0748 06/30/24 0909  BP: (!) 193/84 (!) 106/55  Pulse: 80 77  Resp: 18 17  Temp: 36.6 C 36.4 C  SpO2: 100% 100%    Last Pain:  Vitals:   06/30/24 0909  TempSrc: Oral  PainSc: 0-No pain                 Andrea Limes

## 2024-06-30 NOTE — H&P (Signed)
 Lori Fleming is an 53 y.o. female.   Chief Complaint: Colorectal cancer screening HPI: 53 year old female with past medical history of CKD, diabetes, hypertension and stroke, coming for screening colonoscopy. The patient has never had a colonoscopy in the past.  The patient denies having any complaints such as melena, hematochezia, abdominal pain or distention, change in her bowel movement consistency or frequency, no changes in weight recently.  No family history of colorectal cancer.   Past Medical History:  Diagnosis Date   Chronic kidney disease    Diabetes mellitus without complication (HCC)    Hypertension    Stroke [facility])     Past Surgical History:  Procedure Laterality Date   TEE WITHOUT CARDIOVERSION N/A 07/10/2021   Procedure: TRANSESOPHAGEAL ECHOCARDIOGRAM (TEE);  Surgeon: Barbaraann Darryle Ned, MD;  Location: [facility];  Service: Cardiovascular;  Laterality: N/A;    Family History  Problem Relation Age of Onset   Hypertension Father    Diabetes Father    Hypertension Sister    Mental illness Sister    Mental illness Brother    Alcohol abuse Paternal Grandfather    Diabetes Maternal Aunt    Social History:  reports that she has never smoked. She has never used smokeless tobacco. She reports that she does not drink alcohol and does not use drugs.  Allergies: No Known Allergies  Medications Prior to Admission  Medication Sig Dispense Refill   amLODipine  (NORVASC ) 5 MG tablet Take 1 tablet (5 mg total) by mouth daily. 90 tablet 1   aspirin  EC 81 MG tablet Take 81 mg by mouth daily. Swallow whole.     brimonidine  (ALPHAGAN ) 0.2 % ophthalmic solution Place 1 drop into the left eye 3 (three) times daily. 5 mL 12   dorzolamide -timolol  (COSOPT ) 22.3-6.8 MG/ML ophthalmic solution Place 1 drop into the left eye 2 (two) times daily. 10 mL 3   hydrALAZINE  (APRESOLINE ) 50 MG tablet Take 50 mg by mouth 2 (two) times daily.     rosuvastatin  (CRESTOR ) 20 MG tablet Take 1 tablet (20  mg total) by mouth daily. 90 tablet 3   sodium bicarbonate  650 MG tablet Take 650 mg by mouth 3 (three) times daily.      Results for orders placed or performed during the hospital encounter of 06/30/24 (from the past 48 hours)  Pregnancy, urine POC     Status: None   Collection Time: 06/30/24  7:48 AM  Result Value Ref Range   Preg Test, Ur NEGATIVE NEGATIVE    Comment:        THE SENSITIVITY OF THIS METHODOLOGY IS >20 mIU/mL.    No results found.  Review of Systems  All other systems reviewed and are negative.   Blood pressure (!) 193/84, pulse 80, temperature 97.9 F (36.6 C), temperature source Oral, resp. rate 18, height 5' 6 (1.676 m), weight 64 kg, SpO2 100%. Physical Exam  GENERAL: The patient is AO x3, in no acute distress. HEENT: Head is normocephalic and atraumatic. EOMI are intact. Mouth is well hydrated and without lesions. NECK: Supple. No masses LUNGS: Clear to auscultation. No presence of rhonchi/wheezing/rales. Adequate chest expansion HEART: RRR, normal s1 and s2. ABDOMEN: Soft, nontender, no guarding, no peritoneal signs, and nondistended. BS +. No masses. EXTREMITIES: Without any cyanosis, clubbing, rash, lesions or edema. NEUROLOGIC: AOx3, no focal motor deficit. SKIN: no jaundice, no rashes  Assessment/Plan 53 year old female with past medical history of CKD, diabetes, hypertension and stroke, coming for screening colonoscopy. The patient is at  average risk for colorectal cancer.  We will proceed with colonoscopy today.   Toribio Eartha Flavors, MD 06/30/2024, 8:00 AM

## 2024-06-30 NOTE — Op Note (Signed)
 [facility] Patient Name: Lori Fleming Procedure Date: 06/30/2024 8:35 AM MRN: [id number] Date of Birth: February 25, 1971 Attending MD: Toribio Fortune , , [id number] CSN: [id number] Age: 53 Admit Type: Outpatient Procedure:                Colonoscopy Indications:              Screening for colorectal malignant neoplasm Providers:                Toribio Fortune, Olam Ada, RN, Dorcas Lenis,                            Technician Referring MD:              Medicines:                Monitored Anesthesia Care Complications:            No immediate complications. Estimated Blood Loss:     Estimated blood loss: none. Procedure:                Pre-Anesthesia Assessment:                           - Prior to the procedure, a History and Physical                            was performed, and patient medications, allergies                            and sensitivities were reviewed. The patient's                            tolerance of previous anesthesia was reviewed.                           - The risks and benefits of the procedure and the                            sedation options and risks were discussed with the                            patient. All questions were answered and informed                            consent was obtained.                           - ASA Grade Assessment: II - A patient with mild                            systemic disease.                           After obtaining informed consent, the colonoscope                            was passed under direct vision. Throughout the  procedure, the patient's blood pressure, pulse, and                            oxygen saturations were monitored continuously. The                            [id number] ([id number]) Peds Colon was introduced                            through the anus and advanced to the the cecum,                            identified by appendiceal orifice and ileocecal                             valve. The colonoscopy was performed without                            difficulty. The patient tolerated the procedure                            well. The quality of the bowel preparation was                            excellent. Scope In: 8:45:26 AM Scope Out: 9:01:22 AM Scope Withdrawal Time: 0 hours 11 minutes 27 seconds  Total Procedure Duration: 0 hours 15 minutes 56 seconds  Findings:      The perianal and digital rectal examinations were normal.      A 4 mm polyp was found in the sigmoid colon. The polyp was sessile. The       polyp was removed with a cold snare. Resection and retrieval were       complete.      The retroflexed view of the distal rectum and anal verge was normal and       showed no anal or rectal abnormalities. Impression:               - One 4 mm polyp in the sigmoid colon, removed with                            a cold snare. Resected and retrieved.                           - The distal rectum and anal verge are normal on                            retroflexion view. Moderate Sedation:      Per Anesthesia Care Recommendation:           - Discharge patient to home (ambulatory).                           - Resume previous diet.                           - Await pathology results.                           -  Repeat colonoscopy for surveillance based on                            pathology results. Procedure Code(s):        --- Professional ---                           [phone], Colonoscopy, flexible; with removal of                            tumor(s), polyp(s), or other lesion(s) by snare                            technique Diagnosis Code(s):        --- Professional ---                           Z12.11, Encounter for screening for malignant                            neoplasm of colon                           D12.5, Benign neoplasm of sigmoid colon CPT copyright 2022 American Medical Association. All rights reserved. The codes  documented in this report are preliminary and upon coder review may  be revised to meet current compliance requirements. Toribio Fortune, MD Toribio Fortune,  06/30/2024 9:09:33 AM This report has been signed electronically. Number of Addenda: 0

## 2024-06-30 NOTE — Discharge Instructions (Signed)
 You are being discharged to home.  Resume your previous diet.  We are waiting for your pathology results.  Your physician has recommended a repeat colonoscopy for surveillance based on pathology results.

## 2024-06-30 NOTE — Anesthesia Preprocedure Evaluation (Signed)
 Anesthesia Evaluation  Patient identified by MRN, date of birth, ID band Patient awake    Reviewed: Allergy & Precautions, H&P , NPO status , Patient's Chart, lab work & pertinent test results  Airway Mallampati: II  TM Distance: >3 FB Neck ROM: Full    Dental no notable dental hx.    Pulmonary neg pulmonary ROS   Pulmonary exam normal breath sounds clear to auscultation       Cardiovascular hypertension, Normal cardiovascular exam Rhythm:Regular Rate:Normal     Neuro/Psych  Headaches CVA  negative psych ROS   GI/Hepatic negative GI ROS, Neg liver ROS,,,  Endo/Other  diabetes    Renal/GU Renal diseaseStage 4 CKD Preop K ordered   negative genitourinary   Musculoskeletal negative musculoskeletal ROS (+)    Abdominal   Peds negative pediatric ROS (+)  Hematology  (+) Blood dyscrasia, anemia   Anesthesia Other Findings   Reproductive/Obstetrics negative OB ROS                              Anesthesia Physical Anesthesia Plan  ASA: 3  Anesthesia Plan: General   Post-op Pain Management:    Induction: Intravenous  PONV Risk Score and Plan:   Airway Management Planned: Nasal Cannula  Additional Equipment:   Intra-op Plan:   Post-operative Plan:   Informed Consent: I have reviewed the patients History and Physical, chart, labs and discussed the procedure including the risks, benefits and alternatives for the proposed anesthesia with the patient or authorized representative who has indicated his/her understanding and acceptance.     Dental advisory given  Plan Discussed with: CRNA  Anesthesia Plan Comments:         Anesthesia Quick Evaluation

## 2024-07-01 ENCOUNTER — Ambulatory Visit (INDEPENDENT_AMBULATORY_CARE_PROVIDER_SITE_OTHER): Payer: Self-pay | Admitting: Gastroenterology

## 2024-07-01 LAB — POCT I-STAT, CHEM 8
BUN: 28 mg/dL — ABNORMAL HIGH (ref 6–20)
Calcium, Ion: 1.15 mmol/L (ref 1.15–1.40)
Chloride: 109 mmol/L (ref 98–111)
Creatinine, Ser: 3.8 mg/dL — ABNORMAL HIGH (ref 0.44–1.00)
Glucose, Bld: 75 mg/dL (ref 70–99)
HCT: 30 % — ABNORMAL LOW (ref 36.0–46.0)
Hemoglobin: 10.2 g/dL — ABNORMAL LOW (ref 12.0–15.0)
Potassium: 4.1 mmol/L (ref 3.5–5.1)
Sodium: 139 mmol/L (ref 135–145)
TCO2: 17 mmol/L — ABNORMAL LOW (ref 22–32)

## 2024-07-01 LAB — SURGICAL PATHOLOGY

## 2024-07-03 ENCOUNTER — Encounter (HOSPITAL_COMMUNITY): Payer: Self-pay | Admitting: Gastroenterology

## 2024-07-08 ENCOUNTER — Encounter (INDEPENDENT_AMBULATORY_CARE_PROVIDER_SITE_OTHER): Payer: Self-pay | Admitting: Gastroenterology

## 2024-07-10 NOTE — Progress Notes (Signed)
 10 yr TCS noted in recall Patient result letter mailed procedure note and pathology result faxed to PCP

## 2024-07-15 DIAGNOSIS — E113511 Type 2 diabetes mellitus with proliferative diabetic retinopathy with macular edema, right eye: Secondary | ICD-10-CM | POA: Diagnosis not present

## 2024-08-12 DIAGNOSIS — D509 Iron deficiency anemia, unspecified: Secondary | ICD-10-CM | POA: Diagnosis not present

## 2024-08-12 DIAGNOSIS — N250 Renal osteodystrophy: Secondary | ICD-10-CM | POA: Diagnosis not present

## 2024-08-12 DIAGNOSIS — I129 Hypertensive chronic kidney disease with stage 1 through stage 4 chronic kidney disease, or unspecified chronic kidney disease: Secondary | ICD-10-CM | POA: Diagnosis not present

## 2024-08-12 DIAGNOSIS — N184 Chronic kidney disease, stage 4 (severe): Secondary | ICD-10-CM | POA: Diagnosis not present

## 2024-08-12 LAB — LAB REPORT - SCANNED: EGFR: 12

## 2024-08-17 ENCOUNTER — Other Ambulatory Visit (HOSPITAL_COMMUNITY): Payer: Self-pay | Admitting: Nephrology

## 2024-08-17 DIAGNOSIS — D631 Anemia in chronic kidney disease: Secondary | ICD-10-CM | POA: Insufficient documentation

## 2024-08-24 DIAGNOSIS — Z124 Encounter for screening for malignant neoplasm of cervix: Secondary | ICD-10-CM | POA: Diagnosis not present

## 2024-08-24 DIAGNOSIS — N186 End stage renal disease: Secondary | ICD-10-CM | POA: Diagnosis not present

## 2024-08-24 DIAGNOSIS — R232 Flushing: Secondary | ICD-10-CM | POA: Diagnosis not present

## 2024-08-24 DIAGNOSIS — Z0001 Encounter for general adult medical examination with abnormal findings: Secondary | ICD-10-CM | POA: Diagnosis not present

## 2024-08-24 DIAGNOSIS — D251 Intramural leiomyoma of uterus: Secondary | ICD-10-CM | POA: Diagnosis not present

## 2024-08-31 DIAGNOSIS — Z8673 Personal history of transient ischemic attack (TIA), and cerebral infarction without residual deficits: Secondary | ICD-10-CM | POA: Diagnosis not present

## 2024-08-31 DIAGNOSIS — R29898 Other symptoms and signs involving the musculoskeletal system: Secondary | ICD-10-CM | POA: Diagnosis not present

## 2024-08-31 DIAGNOSIS — N184 Chronic kidney disease, stage 4 (severe): Secondary | ICD-10-CM | POA: Diagnosis not present

## 2024-08-31 DIAGNOSIS — Z789 Other specified health status: Secondary | ICD-10-CM | POA: Diagnosis not present

## 2024-08-31 DIAGNOSIS — Z7409 Other reduced mobility: Secondary | ICD-10-CM | POA: Diagnosis not present

## 2024-08-31 DIAGNOSIS — R531 Weakness: Secondary | ICD-10-CM | POA: Diagnosis not present

## 2024-08-31 DIAGNOSIS — R2689 Other abnormalities of gait and mobility: Secondary | ICD-10-CM | POA: Diagnosis not present

## 2024-08-31 DIAGNOSIS — Z01818 Encounter for other preprocedural examination: Secondary | ICD-10-CM | POA: Diagnosis not present

## 2024-09-01 ENCOUNTER — Telehealth: Payer: Self-pay

## 2024-09-01 NOTE — Telephone Encounter (Signed)
 Auth Submission: APPROVED Site of care: Site of care: [facility] Payer: [employer] comm ppo Medication & CPT/J Code(s) submitted: Venofer (Iron Sucrose) J1756 Diagnosis Code:  Route of submission (phone, fax, portal): portal Phone # Fax # Auth type: Buy/Bill PB Units/visits requested: 200mg  x 3 doses Reference number: [id number] Approval from: 08/24/24 to 08/24/2025

## 2024-09-07 DIAGNOSIS — Z8673 Personal history of transient ischemic attack (TIA), and cerebral infarction without residual deficits: Secondary | ICD-10-CM | POA: Diagnosis not present

## 2024-09-07 DIAGNOSIS — R2689 Other abnormalities of gait and mobility: Secondary | ICD-10-CM | POA: Diagnosis not present

## 2024-09-07 DIAGNOSIS — Z789 Other specified health status: Secondary | ICD-10-CM | POA: Diagnosis not present

## 2024-09-07 DIAGNOSIS — R531 Weakness: Secondary | ICD-10-CM | POA: Diagnosis not present

## 2024-09-07 DIAGNOSIS — Z01818 Encounter for other preprocedural examination: Secondary | ICD-10-CM | POA: Diagnosis not present

## 2024-09-07 DIAGNOSIS — R29898 Other symptoms and signs involving the musculoskeletal system: Secondary | ICD-10-CM | POA: Diagnosis not present

## 2024-09-07 DIAGNOSIS — Z7409 Other reduced mobility: Secondary | ICD-10-CM | POA: Diagnosis not present

## 2024-09-07 DIAGNOSIS — N184 Chronic kidney disease, stage 4 (severe): Secondary | ICD-10-CM | POA: Diagnosis not present

## 2024-09-08 ENCOUNTER — Ambulatory Visit

## 2024-09-08 VITALS — BP 164/71 | HR 80 | Temp 98.4°F | Resp 16

## 2024-09-08 DIAGNOSIS — N184 Chronic kidney disease, stage 4 (severe): Secondary | ICD-10-CM | POA: Diagnosis not present

## 2024-09-08 DIAGNOSIS — D631 Anemia in chronic kidney disease: Secondary | ICD-10-CM | POA: Diagnosis not present

## 2024-09-08 MED ORDER — IRON SUCROSE 20 MG/ML IV SOLN: 200.0000 mg | Freq: Once | INTRAVENOUS | Status: DC

## 2024-09-08 MED ORDER — ACETAMINOPHEN 325 MG PO TABS
650.0000 mg | ORAL_TABLET | Freq: Once | ORAL | Status: AC
  Administered 2024-09-08: 09:00:00 650 mg via ORAL

## 2024-09-08 MED ORDER — SODIUM CHLORIDE 0.9 % IV SOLN
200.0000 mg | Freq: Once | INTRAVENOUS | Status: AC
  Administered 2024-09-08: 10:00:00 200 mg via INTRAVENOUS
  Filled 2024-09-08: qty 10

## 2024-09-08 MED ORDER — DIPHENHYDRAMINE HCL 25 MG PO CAPS
25.0000 mg | ORAL_CAPSULE | Freq: Once | ORAL | Status: AC
  Administered 2024-09-08: 09:00:00 25 mg via ORAL

## 2024-09-08 NOTE — Progress Notes (Signed)
 Diagnosis: Iron  Deficiency Anemia  Provider:  Melia Lynwood ORN, MD  Procedure: IV Infusion  IV Type: Peripheral, IV Location: L Antecubital  Venofer  (Iron  Sucrose), Dose: 200 mg  Infusion Start Time: 0939  Infusion Stop Time: 0957  Post Infusion IV Care: Observation period completed and Peripheral IV Discontinued  Discharge: Condition: Good, Destination: Home . AVS Provided  Performed by:  Delon ONEIDA Officer, RN

## 2024-09-09 DIAGNOSIS — E113511 Type 2 diabetes mellitus with proliferative diabetic retinopathy with macular edema, right eye: Secondary | ICD-10-CM | POA: Diagnosis not present

## 2024-09-10 DIAGNOSIS — Z789 Other specified health status: Secondary | ICD-10-CM | POA: Diagnosis not present

## 2024-09-10 DIAGNOSIS — N184 Chronic kidney disease, stage 4 (severe): Secondary | ICD-10-CM | POA: Diagnosis not present

## 2024-09-10 DIAGNOSIS — R531 Weakness: Secondary | ICD-10-CM | POA: Diagnosis not present

## 2024-09-10 DIAGNOSIS — Z7409 Other reduced mobility: Secondary | ICD-10-CM | POA: Diagnosis not present

## 2024-09-10 DIAGNOSIS — Z8673 Personal history of transient ischemic attack (TIA), and cerebral infarction without residual deficits: Secondary | ICD-10-CM | POA: Diagnosis not present

## 2024-09-10 DIAGNOSIS — R2689 Other abnormalities of gait and mobility: Secondary | ICD-10-CM | POA: Diagnosis not present

## 2024-09-10 DIAGNOSIS — R29898 Other symptoms and signs involving the musculoskeletal system: Secondary | ICD-10-CM | POA: Diagnosis not present

## 2024-09-10 DIAGNOSIS — Z01818 Encounter for other preprocedural examination: Secondary | ICD-10-CM | POA: Diagnosis not present

## 2024-09-10 NOTE — Progress Notes (Signed)
 Pt attended the [facility] webinar-module two. Pt will be mailed module two webinar resources ([facility] kidney donation Voucher programs, Donor Shield, [facility] family house, and [facility] [place] pamphlets along w/a copy of the [facility] program Ppt.).

## 2024-09-14 ENCOUNTER — Ambulatory Visit

## 2024-09-14 VITALS — BP 185/89 | HR 78 | Temp 97.9°F | Resp 16

## 2024-09-14 DIAGNOSIS — D631 Anemia in chronic kidney disease: Secondary | ICD-10-CM | POA: Diagnosis not present

## 2024-09-14 DIAGNOSIS — R2689 Other abnormalities of gait and mobility: Secondary | ICD-10-CM | POA: Diagnosis not present

## 2024-09-14 DIAGNOSIS — Z789 Other specified health status: Secondary | ICD-10-CM | POA: Diagnosis not present

## 2024-09-14 DIAGNOSIS — R531 Weakness: Secondary | ICD-10-CM | POA: Diagnosis not present

## 2024-09-14 DIAGNOSIS — N184 Chronic kidney disease, stage 4 (severe): Secondary | ICD-10-CM | POA: Diagnosis not present

## 2024-09-14 DIAGNOSIS — Z7409 Other reduced mobility: Secondary | ICD-10-CM | POA: Diagnosis not present

## 2024-09-14 DIAGNOSIS — N189 Chronic kidney disease, unspecified: Secondary | ICD-10-CM | POA: Diagnosis not present

## 2024-09-14 DIAGNOSIS — R29898 Other symptoms and signs involving the musculoskeletal system: Secondary | ICD-10-CM | POA: Diagnosis not present

## 2024-09-14 DIAGNOSIS — Z8673 Personal history of transient ischemic attack (TIA), and cerebral infarction without residual deficits: Secondary | ICD-10-CM | POA: Diagnosis not present

## 2024-09-14 DIAGNOSIS — Z01818 Encounter for other preprocedural examination: Secondary | ICD-10-CM | POA: Diagnosis not present

## 2024-09-14 MED ORDER — SODIUM CHLORIDE 0.9 % IV SOLN
200.0000 mg | Freq: Once | INTRAVENOUS | Status: AC
  Administered 2024-09-14: 200 mg via INTRAVENOUS
  Filled 2024-09-14: qty 10

## 2024-09-14 MED ORDER — DIPHENHYDRAMINE HCL 25 MG PO CAPS
25.0000 mg | ORAL_CAPSULE | Freq: Once | ORAL | Status: AC
  Administered 2024-09-14: 25 mg via ORAL

## 2024-09-14 MED ORDER — ACETAMINOPHEN 325 MG PO TABS
650.0000 mg | ORAL_TABLET | Freq: Once | ORAL | Status: AC
  Administered 2024-09-14: 650 mg via ORAL

## 2024-09-14 NOTE — Progress Notes (Signed)
 Diagnosis: Iron  Deficiency Anemia  Provider:  Lynwood Farr MD  Procedure: IV Infusion  IV Type: Peripheral, IV Location: L Antecubital  Venofer  (Iron  Sucrose), Dose: 200 mg  Infusion Start Time: 1400  Infusion Stop Time: 1415  Post Infusion IV Care: Observation period completed and Peripheral IV Discontinued  Discharge: Condition: Good, Destination: Home . AVS Declined  Performed by:  Delon ONEIDA Officer, RN

## 2024-09-22 DIAGNOSIS — R531 Weakness: Secondary | ICD-10-CM | POA: Diagnosis not present

## 2024-09-22 DIAGNOSIS — Z01818 Encounter for other preprocedural examination: Secondary | ICD-10-CM | POA: Diagnosis not present

## 2024-09-22 DIAGNOSIS — Z8673 Personal history of transient ischemic attack (TIA), and cerebral infarction without residual deficits: Secondary | ICD-10-CM | POA: Diagnosis not present

## 2024-09-22 DIAGNOSIS — R29898 Other symptoms and signs involving the musculoskeletal system: Secondary | ICD-10-CM | POA: Diagnosis not present

## 2024-09-22 DIAGNOSIS — N184 Chronic kidney disease, stage 4 (severe): Secondary | ICD-10-CM | POA: Diagnosis not present

## 2024-09-22 DIAGNOSIS — R2689 Other abnormalities of gait and mobility: Secondary | ICD-10-CM | POA: Diagnosis not present

## 2024-09-22 DIAGNOSIS — Z7409 Other reduced mobility: Secondary | ICD-10-CM | POA: Diagnosis not present

## 2024-09-22 DIAGNOSIS — Z789 Other specified health status: Secondary | ICD-10-CM | POA: Diagnosis not present

## 2024-10-27 ENCOUNTER — Other Ambulatory Visit: Payer: Self-pay | Admitting: Internal Medicine

## 2024-10-29 ENCOUNTER — Other Ambulatory Visit (HOSPITAL_COMMUNITY): Payer: Self-pay | Admitting: Nephrology

## 2024-10-29 NOTE — Progress Notes (Signed)
 Received Retacrit orders. This is a NEW order.  Dx: N18.4/D63.1 Dose: Retacrit 10000 units subcut every 4 weeks.  Labs: Hemoglobin via Hemocue at every visit; iron  panel monthly; ferritin monthly Additional labs: N/A  BP Paramaters: check BP prior to ESA injections. If SBP > 180 and/or DBP>110: - Hold injection, fax copy of BP to [facility] at [phone] - Clonidine 0.1mg  p.o. x 1 if BP parameters after 30 minutes, give ESA injection. If BP is not within parameters, then notify MD.  Mandatory: fax labs monthly and with every dose change to MD office   Sherry Pennant, PharmD, MPH, BCPS, CPP Clinical Pharmacist

## 2024-12-14 ENCOUNTER — Ambulatory Visit: Admitting: Nutrition
